# Patient Record
Sex: Male | Born: 1935 | Race: White | Hispanic: No | Marital: Married | Smoking: Former smoker
Health system: Southern US, Community
[De-identification: ages and names within clinical notes are randomized; demographics above are authoritative.]

## PROBLEM LIST (undated history)

## (undated) DIAGNOSIS — E785 Hyperlipidemia, unspecified: Secondary | ICD-10-CM

## (undated) DIAGNOSIS — M549 Dorsalgia, unspecified: Secondary | ICD-10-CM

## (undated) DIAGNOSIS — K5790 Diverticulosis of intestine, part unspecified, without perforation or abscess without bleeding: Secondary | ICD-10-CM

## (undated) DIAGNOSIS — D472 Monoclonal gammopathy: Secondary | ICD-10-CM

## (undated) DIAGNOSIS — I2510 Atherosclerotic heart disease of native coronary artery without angina pectoris: Secondary | ICD-10-CM

## (undated) DIAGNOSIS — M1990 Unspecified osteoarthritis, unspecified site: Secondary | ICD-10-CM

## (undated) DIAGNOSIS — G629 Polyneuropathy, unspecified: Secondary | ICD-10-CM

## (undated) DIAGNOSIS — M7072 Other bursitis of hip, left hip: Secondary | ICD-10-CM

## (undated) DIAGNOSIS — I10 Essential (primary) hypertension: Secondary | ICD-10-CM

## (undated) DIAGNOSIS — D649 Anemia, unspecified: Secondary | ICD-10-CM

## (undated) HISTORY — DX: Unspecified osteoarthritis, unspecified site: M19.90

## (undated) HISTORY — PX: ADENOIDECTOMY: SUR15

## (undated) HISTORY — DX: Monoclonal gammopathy: D47.2

## (undated) HISTORY — PX: COLONOSCOPY: SHX174

## (undated) HISTORY — PX: CORONARY ARTERY BYPASS GRAFT: SHX141

## (undated) HISTORY — PX: INGUINAL HERNIA REPAIR: SUR1180

## (undated) HISTORY — DX: Diverticulosis of intestine, part unspecified, without perforation or abscess without bleeding: K57.90

## (undated) HISTORY — DX: Anemia, unspecified: D64.9

## (undated) HISTORY — DX: Dorsalgia, unspecified: M54.9

## (undated) HISTORY — DX: Hyperlipidemia, unspecified: E78.5

## (undated) HISTORY — DX: Essential (primary) hypertension: I10

## (undated) HISTORY — DX: Other bursitis of hip, left hip: M70.72

## (undated) HISTORY — DX: Polyneuropathy, unspecified: G62.9

## (undated) HISTORY — DX: Atherosclerotic heart disease of native coronary artery without angina pectoris: I25.10

---

## 2008-01-16 ENCOUNTER — Encounter: Admission: RE | Admit: 2008-01-16 | Discharge: 2008-01-16 | Payer: Self-pay | Admitting: Internal Medicine

## 2009-02-26 ENCOUNTER — Ambulatory Visit: Payer: Self-pay | Admitting: Gastroenterology

## 2009-03-12 ENCOUNTER — Ambulatory Visit: Payer: Self-pay | Admitting: Gastroenterology

## 2009-08-11 HISTORY — PX: OTHER SURGICAL HISTORY: SHX169

## 2009-11-06 ENCOUNTER — Emergency Department (HOSPITAL_COMMUNITY): Admission: EM | Admit: 2009-11-06 | Discharge: 2009-11-06 | Payer: Self-pay | Admitting: Family Medicine

## 2010-03-11 ENCOUNTER — Encounter: Payer: Self-pay | Admitting: Cardiology

## 2010-10-14 ENCOUNTER — Ambulatory Visit: Payer: Self-pay | Admitting: Cardiology

## 2010-11-02 NOTE — Letter (Signed)
Summary: [facility]   Imported By: Debby Freiberg 03/30/2010 12:06:14  _____________________________________________________________________  External Attachment:    Type:   Image     Comment:   External Document

## 2011-01-21 ENCOUNTER — Encounter (INDEPENDENT_AMBULATORY_CARE_PROVIDER_SITE_OTHER): Payer: Medicare Other

## 2011-01-21 DIAGNOSIS — M7989 Other specified soft tissue disorders: Secondary | ICD-10-CM

## 2011-01-21 DIAGNOSIS — M79609 Pain in unspecified limb: Secondary | ICD-10-CM

## 2011-03-04 HISTORY — PX: US ECHOCARDIOGRAPHY: HXRAD669

## 2011-03-07 ENCOUNTER — Other Ambulatory Visit (HOSPITAL_COMMUNITY): Payer: Self-pay | Admitting: Internal Medicine

## 2011-03-07 DIAGNOSIS — R609 Edema, unspecified: Secondary | ICD-10-CM

## 2011-03-08 ENCOUNTER — Ambulatory Visit (HOSPITAL_COMMUNITY): Payer: Medicare Other | Attending: Internal Medicine | Admitting: Radiology

## 2011-03-08 DIAGNOSIS — I080 Rheumatic disorders of both mitral and aortic valves: Secondary | ICD-10-CM | POA: Insufficient documentation

## 2011-03-08 DIAGNOSIS — R609 Edema, unspecified: Secondary | ICD-10-CM

## 2011-03-08 DIAGNOSIS — I379 Nonrheumatic pulmonary valve disorder, unspecified: Secondary | ICD-10-CM | POA: Insufficient documentation

## 2011-03-08 DIAGNOSIS — R011 Cardiac murmur, unspecified: Secondary | ICD-10-CM | POA: Insufficient documentation

## 2011-03-08 DIAGNOSIS — I079 Rheumatic tricuspid valve disease, unspecified: Secondary | ICD-10-CM | POA: Insufficient documentation

## 2011-03-08 DIAGNOSIS — I10 Essential (primary) hypertension: Secondary | ICD-10-CM | POA: Insufficient documentation

## 2011-03-08 DIAGNOSIS — I2510 Atherosclerotic heart disease of native coronary artery without angina pectoris: Secondary | ICD-10-CM | POA: Insufficient documentation

## 2011-05-27 NOTE — Procedures (Unsigned)
DUPLEX DEEP VENOUS EXAM - LOWER EXTREMITY  INDICATION:  Bilateral lower extremity pain and swelling.  HISTORY:  Edema:  yes Trauma/Surgery:  no Pain:  yes PE:  no Previous DVT:  no Anticoagulants:  Half of a 325 mg aspirin daily Other:  DUPLEX EXAM:               CFV   SFV   PopV  PTV    GSV               R  L  R  L  R  L  R   L  R  L Thrombosis    0  0  0  0  0  0  0   0  0  0 Spontaneous   +  +  +  +  +  +  +   +  +  + Phasic        +  +  +  +  +  +  +   +  +  + Augmentation  +  +  +  +  +  +  +   +  +  + Compressible  +  +  +  +  +  +  +   +  +  + Competent     +  +  +  +  +  +  +   +  +  +  Legend:  + - yes  o - no  p - partial  D - decreased  IMPRESSION:  No evidence of deeper superficial thrombosis identified in the bilateral lower extremities. Preliminary report given to Cala Bradford, RN at Dr. Alver Fisher office.   _____________________________ Larina Earthly, M.D.  SH/MEDQ  D:  01/21/2011  T:  01/21/2011  Job:  [id number]

## 2011-08-02 ENCOUNTER — Encounter: Payer: Self-pay | Admitting: Nurse Practitioner

## 2011-08-03 ENCOUNTER — Encounter: Payer: Self-pay | Admitting: Nurse Practitioner

## 2011-08-03 ENCOUNTER — Ambulatory Visit (INDEPENDENT_AMBULATORY_CARE_PROVIDER_SITE_OTHER): Payer: Medicare Other | Admitting: Nurse Practitioner

## 2011-08-03 VITALS — BP 98/66 | HR 80 | Ht 70.0 in | Wt 150.8 lb

## 2011-08-03 DIAGNOSIS — I38 Endocarditis, valve unspecified: Secondary | ICD-10-CM | POA: Insufficient documentation

## 2011-08-03 DIAGNOSIS — I10 Essential (primary) hypertension: Secondary | ICD-10-CM | POA: Insufficient documentation

## 2011-08-03 DIAGNOSIS — E785 Hyperlipidemia, unspecified: Secondary | ICD-10-CM

## 2011-08-03 DIAGNOSIS — I2510 Atherosclerotic heart disease of native coronary artery without angina pectoris: Secondary | ICD-10-CM | POA: Insufficient documentation

## 2011-08-03 NOTE — Progress Notes (Signed)
Mitchell Fields Date of Birth: [birth year] Medical Record #[id number]  History of Present Illness: [name] is seen today for an approximate 10 month visit. He is seen for Dr. [place]. He has a known history of remote MI, remote CABG with HTN and hyperlipidemia. He had an echo in June of this year showing normal EF, mild aortic regurgitation, mild MR, mild to moderate LAE, mildly dilated RV, moderate RAE, moderate to severe TR and redundant atrial septum consistent with increased left atrial pressure. Unfortunately, he did not get called and says Dr. Clelia Croft did not get a report.   He comes in today. He is doing well. No chest pain or shortness of breath. Has just recovered from a bout of food poisoning. Blood pressure at home is well controlled. He has had to cut his Norvasc back due to some swelling. No CHF symptoms. Feels good on his medicines. Not exercising as he should due to bursitis.   Current Outpatient Prescriptions on File Prior to Visit  Medication Sig Dispense Refill  . aspirin 325 MG tablet Take 162.5 mg by mouth daily.        Marland Kitchen atenolol (TENORMIN) 50 MG tablet Take 25 mg by mouth daily.       Marland Kitchen atorvastatin (LIPITOR) 80 MG tablet Take 40 mg by mouth daily.       . Cholecalciferol (VITAMIN D PO) Take by mouth daily.        Marland Kitchen ezetimibe (ZETIA) 10 MG tablet Take 10 mg by mouth daily.        . Multiple Vitamin (MULTIVITAMIN) tablet Take 1 tablet by mouth daily.        . quinapril (ACCUPRIL) 40 MG tablet Take 20 mg by mouth at bedtime.       Marland Kitchen DISCONTD: amLODipine (NORVASC) 10 MG tablet Take 10 mg by mouth daily.         Allergies  Allergen Reactions  . Cortisone     REACTION: shut adrenal glands down    Past Medical History  Diagnosis Date  . Hyperlipidemia   . Hypertension   . MI, old   . Arthritis   . Coronary artery disease     TWO VESSEL BYPASS SURGERY in January 1995  . Palpitations   . Back pain     Past Surgical History  Procedure Date  . Coronary artery  bypass graft     X2. LIMA GRAFT AND A RIMA GRAFT.  . Back surgery     X3  . Hernia repair   . Negative stress echo 08/11/2009  . US echocardiography June 2012    Normal EF, mild aortic regurgitation, mild MR, LAE, mildly dilated RV, RAE, increased left atrial pressure, moderate to severe TR and mildly increased  PA systolic pressure.     History  Smoking status  . Former Smoker  . Quit date: 10/04/1967  Smokeless tobacco  . Not on file    History  Alcohol Use No    Family History  Problem Relation Age of Onset  . Lung cancer Father     Review of Systems: The review of systems is positive for bursitis. No chest pain or shortness of breath. No dizziness.  All other systems were reviewed and are negative.  Physical Exam: BP 98/66  Pulse 80  Ht 5\' 10"  (1.778 m)  Wt 150 lb 12.8 oz (68.402 kg)  BMI 21.64 kg/m2 Patient is very pleasant and in no acute distress. Skin is warm and dry. Color is  normal.  HEENT is unremarkable. Normocephalic/atraumatic. PERRL. Sclera are nonicteric. Neck is supple. No masses. No JVD. Lungs are clear. Cardiac exam shows a regular rate and rhythm. Soft systolic murmur noted.  Abdomen is soft. Extremities are without edema. Gait and ROM are intact. No gross neurologic deficits noted.   LABORATORY DATA:   Assessment / Plan:

## 2011-08-03 NOTE — Assessment & Plan Note (Signed)
Has labs checked by Dr. Clelia Croft.

## 2011-08-03 NOTE — Assessment & Plan Note (Signed)
Has had remote MI with remote CABG in 1995. Negative stress echo in 2010. I have advised for repeat stress testing but he wants to hold off. No symptoms at the present time. We will see him back in 6 months. Patient is agreeable to this plan and will call if any problems develop in the interim.

## 2011-08-03 NOTE — Assessment & Plan Note (Signed)
Blood pressure is good and well controlled at home. No change with his current medicines.

## 2011-08-03 NOTE — Patient Instructions (Signed)
Stay on your current medicines.  Stay active.   We will see you back in 6 months.  Call for any problems.  

## 2011-08-03 NOTE — Assessment & Plan Note (Signed)
Echo is Mitchell Fields is reviewed with the patient. Will forward copy to Dr. Clelia Croft. Clinically doing well. No CHF symptoms. Will follow.

## 2012-09-12 ENCOUNTER — Other Ambulatory Visit (HOSPITAL_COMMUNITY): Payer: Self-pay | Admitting: Internal Medicine

## 2012-09-12 ENCOUNTER — Other Ambulatory Visit (HOSPITAL_COMMUNITY): Payer: Self-pay | Admitting: *Deleted

## 2012-09-12 ENCOUNTER — Other Ambulatory Visit: Payer: Self-pay | Admitting: Internal Medicine

## 2012-09-12 DIAGNOSIS — R6881 Early satiety: Secondary | ICD-10-CM

## 2012-09-12 DIAGNOSIS — R198 Other specified symptoms and signs involving the digestive system and abdomen: Secondary | ICD-10-CM

## 2012-09-14 ENCOUNTER — Ambulatory Visit
Admission: RE | Admit: 2012-09-14 | Discharge: 2012-09-14 | Disposition: A | Discharge status: Home / Self Care | Payer: Medicare Other | Source: Ambulatory Visit | Attending: Internal Medicine | Admitting: Internal Medicine

## 2012-09-14 DIAGNOSIS — R6881 Early satiety: Secondary | ICD-10-CM

## 2012-09-14 MED ORDER — IOHEXOL 350 MG/ML SOLN
125.0000 mL | Freq: Once | INTRAVENOUS | Status: AC | PRN
  Administered 2012-09-14: 125 mL via INTRAVENOUS

## 2012-09-24 ENCOUNTER — Encounter (HOSPITAL_COMMUNITY)
Admission: RE | Admit: 2012-09-24 | Discharge: 2012-09-24 | Disposition: A | Discharge status: Home / Self Care | Payer: Medicare Other | Source: Ambulatory Visit | Attending: Internal Medicine | Admitting: Internal Medicine

## 2012-09-24 ENCOUNTER — Encounter (HOSPITAL_COMMUNITY): Payer: Self-pay

## 2012-09-24 DIAGNOSIS — R6881 Early satiety: Secondary | ICD-10-CM | POA: Insufficient documentation

## 2012-09-24 DIAGNOSIS — K219 Gastro-esophageal reflux disease without esophagitis: Secondary | ICD-10-CM | POA: Insufficient documentation

## 2012-09-24 DIAGNOSIS — R198 Other specified symptoms and signs involving the digestive system and abdomen: Secondary | ICD-10-CM

## 2012-09-24 MED ORDER — TECHNETIUM TC 99M SULFUR COLLOID
2.2000 | Freq: Once | INTRAVENOUS | Status: AC | PRN
  Administered 2012-09-24: 2.2 via INTRAVENOUS

## 2012-11-28 ENCOUNTER — Telehealth: Payer: Self-pay | Admitting: Gastroenterology

## 2012-11-28 NOTE — Telephone Encounter (Signed)
Pt scheduled to see Mike Gip PA tomorrow at 2pm. Malachi Bonds to notify pt of appt date and time and fax records.

## 2012-11-29 ENCOUNTER — Encounter: Payer: Self-pay | Admitting: Physician Assistant

## 2012-11-29 ENCOUNTER — Ambulatory Visit (INDEPENDENT_AMBULATORY_CARE_PROVIDER_SITE_OTHER): Payer: Medicare Other | Admitting: Physician Assistant

## 2012-11-29 VITALS — BP 128/80 | HR 80 | Ht 69.0 in | Wt 153.2 lb

## 2012-11-29 DIAGNOSIS — R110 Nausea: Secondary | ICD-10-CM

## 2012-11-29 DIAGNOSIS — R6881 Early satiety: Secondary | ICD-10-CM

## 2012-11-29 MED ORDER — PANTOPRAZOLE SODIUM 40 MG PO TBEC: 40.0000 mg | DELAYED_RELEASE_TABLET | Freq: Every day | ORAL | Status: DC

## 2012-11-29 NOTE — Patient Instructions (Addendum)
We sent a prescription to [facility] for the Pantoprazole Sodium ( Protonix ). Take 1 tab 30 min before breakfast daily.  You have been scheduled for an endoscopy with propofol. Please follow written instructions given to you at your visit today. If you use inhalers (even only as needed) or a CPAP machine, please bring them with you on the day of your procedure.

## 2012-11-30 ENCOUNTER — Encounter: Payer: Self-pay | Admitting: Physician Assistant

## 2012-11-30 NOTE — Progress Notes (Signed)
Reviewed and agree with management. Robert D. Kaplan, M.D., FACG  

## 2012-11-30 NOTE — Progress Notes (Signed)
Subjective:    Patient ID: Mitchell Fields, male    DOB: Jan 23, 1936, 77 y.o.   MRN: [id number]  HPI  Mitchell Fields is a pleasant 77 year old white male known previously to Dr. Arlyce Dice from colonoscopy. He had colonoscopy done in 2010 and showed moderate sigmoid diverticulosis and internal hemorrhoids. He  Is referred by Dr. Clelia Croft  today for evaluation of early satiety and anorexia.  Patient states he's been having symptoms for about a year and that he has lost about 10 pounds during that time. He says his main problem is that he just does not have any appetite and and when he does eat he  Is full after about 3 bites. He says he forces himself to continue eating and therefore has kept his weight fairly stable. He says that if he eat  too much he would be uncomfortable in his upper abdomen, somewhat nauseated and feels like he wants to vomit. He rarely has heartburn or indigestion and denies any dysphagia or odynophagia . He   really denies any abdominal pain, has not had any changes in his bowel habits melena or hematochezia. He has undergone evaluation with gastric emptying scan which was done in December of 2013 and was normal and he had a CT angio  of his abdomen and pelvis in December of 2013 which showed his mesenteric vessels to be patent. He did have evidence for renal artery stenosis and a 6 mm nodule in the right lower lobe 5 mm nodule in the left lower lobe- no other abdominal abnormalities noted. He has been placed on Remeron over the past several months but says he has not noticed any benefit whatsoever. He occasionally takes ibuprofen and takes 160 mg of aspirin daily.    Review of Systems  Constitutional: Positive for appetite change and fatigue.  HENT: Negative.   Eyes: Negative.   Respiratory: Negative.   Cardiovascular: Negative.   Gastrointestinal: Positive for nausea and abdominal pain.  Endocrine: Negative.   Genitourinary: Negative.   Allergic/Immunologic: Negative.   Neurological:  Negative.   Hematological: Negative.   Psychiatric/Behavioral: Negative.    Outpatient Prescriptions Prior to Visit  Medication Sig Dispense Refill  . amLODipine (NORVASC) 5 MG tablet Take 2.5 mg by mouth daily.        Marland Kitchen aspirin 325 MG tablet Take 162.5 mg by mouth daily.        Marland Kitchen atenolol (TENORMIN) 50 MG tablet Take 25 mg by mouth daily.       Marland Kitchen atorvastatin (LIPITOR) 80 MG tablet Take 20 mg by mouth daily.       . Cholecalciferol (VITAMIN D PO) Take by mouth daily.        Marland Kitchen ezetimibe (ZETIA) 10 MG tablet Take 10 mg by mouth daily.        . quinapril (ACCUPRIL) 40 MG tablet Take 20 mg by mouth at bedtime.       . Multiple Vitamin (MULTIVITAMIN) tablet Take 1 tablet by mouth daily.         No facility-administered medications prior to visit.   Allergies  Allergen Reactions  . Cortisone     REACTION: shut adrenal glands down if given too much   Patient Active Problem List  Diagnosis  . CAD (coronary artery disease)  . HTN (hypertension)  . Hyperlipidemia  . Valvular heart disease   History  Substance Use Topics  . Smoking status: Former Smoker    Quit date: 10/04/1967  . Smokeless tobacco: Never Used  . Alcohol  Use: No   family history includes Diabetes in his father and Lung cancer in his father.     Objective:   Physical Exam Well-developed older white male in no acute distress, pleasant blood pressure 128/80 pulse 80 height 5 foot 9 weight 153. HEENT; nontraumatic normocephalic EOMI PERRLA sclera anicteric, Supple no JVD, Cardiovascula;r regular rate and rhythm with S1-S2 no murmur or gallop he does have a sternal incisional scar, Pulmonary; clear bilaterally, Abdomen; soft, nontender, nondistended bowel sounds are active there is no succussion splash no palpable mass or hepatosplenomegaly  Rectal; exam not done, Extremities; no clubbing cyanosis or edema skin warm and dry, Psych; mood and affect normal and appropriate.       Assessment & Plan:   #8 77 year old male  with a one-year history of anorexia and early satiety of unclear etiology. Minimal weight loss. Workup thus far with negative gastric emptying scan, and patent mesenteric vessels on CT angio. Patient denies depression, and has had no response to Remeron. Rule out gastropathy, peptic ulcer disease, low-grade outlet obstruction and gallbladder disease, or possible medication side effect(lipitor) #2 sigmoid diverticulosis #3 colon neoplasia surveillance-Asian up to date last colonoscopy 2010 #4 new finding of renal artery stenosis #5 lung nodules #6 hyperlipidemia #7 hypertension  Plan; will give him a trial of Protonix 40 mg by mouth daily Schedule for upper endoscopy with Dr. Toney Rakes discussed in detail with the patient and he is agreeable to proceed, If upper endoscopy is negative he will need upper abdominal ultrasound, and if this is unrevealing would consider stopping Lipitor as can have multiple GI side effects.

## 2013-01-04 ENCOUNTER — Ambulatory Visit (AMBULATORY_SURGERY_CENTER): Payer: Medicare Other | Admitting: Gastroenterology

## 2013-01-04 ENCOUNTER — Encounter: Payer: Self-pay | Admitting: Gastroenterology

## 2013-01-04 VITALS — BP 139/79 | HR 62 | Temp 99.7°F | Resp 17 | Ht 69.0 in | Wt 153.0 lb

## 2013-01-04 DIAGNOSIS — R110 Nausea: Secondary | ICD-10-CM

## 2013-01-04 DIAGNOSIS — R6881 Early satiety: Secondary | ICD-10-CM

## 2013-01-04 DIAGNOSIS — K222 Esophageal obstruction: Secondary | ICD-10-CM

## 2013-01-04 MED ORDER — SODIUM CHLORIDE 0.9 % IV SOLN: 500.0000 mL | INTRAVENOUS | Status: DC

## 2013-01-04 NOTE — Progress Notes (Signed)
Stable to RR 

## 2013-01-04 NOTE — Op Note (Signed)
[facility] [address] [place], [address]   ENDOSCOPY PROCEDURE REPORT  PATIENT: Mitchell, Fields  MR#: [id number] BIRTHDATE: 04-09-1936 , 76  yrs. old GENDER: Male ENDOSCOPIST: Louis Meckel, MD REFERRED BY:  Kari Baars, M.D. PROCEDURE DATE:  01/04/2013 PROCEDURE:  EGD, diagnostic and Maloney dilation of esophagus ASA CLASS:     Class II INDICATIONS:  early satiety. MEDICATIONS: MAC sedation, administered by CRNA and propofol (Diprivan) 100mg  IV TOPICAL ANESTHETIC:  DESCRIPTION OF PROCEDURE: After the risks benefits and alternatives of the procedure were thoroughly explained, informed consent was obtained.  The LB-GIF Q180 [id number] endoscope was introduced through the mouth and advanced to the third portion of the duodenum. Without limitations.  The instrument was slowly withdrawn as the mucosa was fully examined.      There is a moderate stricture at the GE junction.  A 7 mm gastroscope easily traversed the stricture.   There is a moderate stricture at the GE junction.  A 7 mm gastroscope easily traversed the stricture.   The upper, middle and distal third of the esophagus were carefully inspected and no abnormalities were noted. The z-line was well seen at the GEJ.  The endoscope was pushed into the fundus which was normal including a retroflexed view.  The antrum, gastric body, first and second part of the duodenum were unremarkable.   The remainder of the upper endoscopy exam was otherwise normal.  Retroflexed views revealed no abnormalities. The scope was then withdrawn.  A #52 Jerene Dilling dilator was passed with mild resistance. There was no heme.  COMPLICATIONS: There were no complications.  ENDOSCOPIC IMPRESSION: esophageal stricture-status post Elease Hashimoto dilation RECOMMENDATIONS: office followup 3-4 weeks  REPEAT EXAM:  eSigned:  Louis Meckel, MD 01/04/2013 3:21 PM   CC:

## 2013-01-04 NOTE — Progress Notes (Addendum)
Patient did not have preoperative order for IV antibiotic SSI prophylaxis. (G8918)  Patient did not experience any of the following events: a burn prior to discharge; a fall within the facility; wrong site/side/patient/procedure/implant event; or a hospital transfer or hospital admission upon discharge from the facility. (G8907)  

## 2013-01-04 NOTE — Patient Instructions (Addendum)
YOU HAD AN ENDOSCOPIC PROCEDURE TODAY AT THE [facility]: Refer to the procedure report that was given to you for any specific questions about what was found during the examination.  If the procedure report does not answer your questions, please call your gastroenterologist to clarify.  If you requested that your care partner not be given the details of your procedure findings, then the procedure report has been included in a sealed envelope for you to review at your convenience later.  YOU SHOULD EXPECT: Some feelings of bloating in the abdomen. Passage of more gas than usual.  Walking can help get rid of the air that was put into your GI tract during the procedure and reduce the bloating. If you had a lower endoscopy (such as a colonoscopy or flexible sigmoidoscopy) you may notice spotting of blood in your stool or on the toilet paper. If you underwent a bowel prep for your procedure, then you may not have a normal bowel movement for a few days.  DIET:   Please give the patient clear liquids when you get home so you know that she can swallow.   For the rest of the day, she needs to stay on a soft diet just in case she has some swelling.   ACTIVITY: Your care partner should take you home directly after the procedure.  You should plan to take it easy, moving slowly for the rest of the day.  You can resume normal activity the day after the procedure however you should NOT DRIVE or use heavy machinery for 24 hours (because of the sedation medicines used during the test).    SYMPTOMS TO REPORT IMMEDIATELY: A gastroenterologist can be reached at any hour.  During normal business hours, 8:30 AM to 5:00 PM Monday through Friday, call [phone].  After hours and on weekends, please call the GI answering service at [phone] who will take a message and have the physician on call contact you.   Following upper endoscopy (EGD)  Vomiting of blood or coffee ground material  New chest pain  or pain under the shoulder blades  Painful or persistently difficult swallowing  New shortness of breath  Fever of 100F or higher  Black, tarry-looking stools  FOLLOW UP: If any biopsies were taken you will be contacted by phone or by letter within the next 1-3 weeks.  Call your gastroenterologist if you have not heard about the biopsies in 3 weeks.  Our staff will call the home number listed on your records the next business day following your procedure to check on you and address any questions or concerns that you may have at that time regarding the information given to you following your procedure. This is a courtesy call and so if there is no answer at the home number and we have not heard from you through the emergency physician on call, we will assume that you have returned to your regular daily activities without incident.  SIGNATURES/CONFIDENTIALITY: You and/or your care partner have signed paperwork which will be entered into your electronic medical record.  These signatures attest to the fact that that the information above on your After Visit Summary has been reviewed and is understood.  Full responsibility of the confidentiality of this discharge information lies with you and/or your care-partner.

## 2013-01-07 ENCOUNTER — Telehealth: Payer: Self-pay | Admitting: *Deleted

## 2013-01-07 NOTE — Telephone Encounter (Signed)
  Follow up Call-  Call back number 01/04/2013  Post procedure Call Back phone  # [phone]  Permission to leave phone message Yes     Patient questions:  Do you have a fever, pain , or abdominal swelling? no Pain Score  0 *  Have you tolerated food without any problems? yes  Have you been able to return to your normal activities? yes  Do you have any questions about your discharge instructions: Diet   no Medications  no Follow up visit  no  Do you have questions or concerns about your Care? no  Actions: * If pain score is 4 or above: No action needed, pain <4.

## 2013-01-21 ENCOUNTER — Encounter: Payer: Self-pay | Admitting: *Deleted

## 2013-01-28 ENCOUNTER — Other Ambulatory Visit (INDEPENDENT_AMBULATORY_CARE_PROVIDER_SITE_OTHER): Payer: Medicare Other

## 2013-01-28 ENCOUNTER — Ambulatory Visit (INDEPENDENT_AMBULATORY_CARE_PROVIDER_SITE_OTHER): Payer: Medicare Other | Admitting: Gastroenterology

## 2013-01-28 ENCOUNTER — Encounter: Payer: Self-pay | Admitting: Gastroenterology

## 2013-01-28 VITALS — BP 128/80 | HR 80 | Ht 69.0 in | Wt 149.0 lb

## 2013-01-28 DIAGNOSIS — R109 Unspecified abdominal pain: Secondary | ICD-10-CM

## 2013-01-28 DIAGNOSIS — R634 Abnormal weight loss: Secondary | ICD-10-CM

## 2013-01-28 DIAGNOSIS — R6889 Other general symptoms and signs: Secondary | ICD-10-CM

## 2013-01-28 DIAGNOSIS — R918 Other nonspecific abnormal finding of lung field: Secondary | ICD-10-CM

## 2013-01-28 LAB — COMPREHENSIVE METABOLIC PANEL
ALT: 23 U/L (ref 0–53)
AST: 23 U/L (ref 0–37)
BUN: 17 mg/dL (ref 6–23)
CO2: 29 mEq/L (ref 19–32)
Calcium: 8.7 mg/dL (ref 8.4–10.5)
Creatinine, Ser: 1.1 mg/dL (ref 0.4–1.5)
GFR: 70.2 mL/min (ref >60.00)
Total Bilirubin: 0.9 mg/dL (ref 0.3–1.2)

## 2013-01-28 LAB — CBC WITH DIFFERENTIAL/PLATELET
Basophils Absolute: 0 10*3/uL (ref 0.0–0.1)
Eosinophils Absolute: 0.6 10*3/uL (ref 0.0–0.7)
Eosinophils Relative: 6.9 % — ABNORMAL HIGH (ref 0.0–5.0)
Lymphocytes Relative: 16.4 % (ref 12.0–46.0)
MCV: 97 fl (ref 78.0–100.0)
Monocytes Absolute: 1.5 10*3/uL — ABNORMAL HIGH (ref 0.1–1.0)
Monocytes Relative: 18.2 % — ABNORMAL HIGH (ref 3.0–12.0)
Neutrophils Relative %: 58 % (ref 43.0–77.0)
Platelets: 291 10*3/uL (ref 150.0–400.0)
WBC: 8.1 10*3/uL (ref 4.5–10.5)

## 2013-01-28 LAB — T4: T4, Total: 7 ug/dL (ref 5.0–12.5)

## 2013-01-28 LAB — TSH: TSH: 1.68 u[IU]/mL (ref 0.35–5.50)

## 2013-01-28 NOTE — Progress Notes (Signed)
History of Present Illness:  Mitchell Fields has returned following upper endoscopy. An early esophageal stricture was seen and dilated.  He continues to complain of weakness, anorexia and early satiety. He denies abdominal pain.  He's lost about 10 pounds over the past year but he feels that it has been difficult to maintain his weight. CT angiogram in December, 2013 demonstrated patent vessels. There was a 6 mm nodule right lower lobe of lung and subtle 5 mm nodule in the left lower lobe. He is not a smoker.    Review of Systems: Pertinent positive and negative review of systems were noted in the above HPI section. All other review of systems were otherwise negative.    Current Medications, Allergies, Past Medical History, Past Surgical History, Family History and Social History were reviewed in [employer] electronic medical record  Vital signs were reviewed in today's medical record. Physical Exam: General: Well developed , well nourished, no acute distress Skin: anicteric Head: Normocephalic and atraumatic Eyes:  sclerae anicteric, EOMI Ears: Normal auditory acuity Mouth: No deformity or lesions Lungs: Clear throughout to auscultation Heart: Regular rate and rhythm; no murmurs, rubs or bruits Abdomen: Soft, non tender and non distended. No masses, hepatosplenomegaly or hernias noted. Normal Bowel sounds. Liver is palpable the right costal margin Rectal:deferred Musculoskeletal: Symmetrical with no gross deformities  Pulses:  Normal pulses noted Extremities: No clubbing, cyanosis, edema or deformities noted Neurological: Alert oriented x 4, grossly nonfocal Psychological:  Alert and cooperative. Normal mood and affect

## 2013-01-28 NOTE — Patient Instructions (Addendum)
Go to the basement for labs today  You have been scheduled for a CT scan of the abdomen and pelvis at [facility] CT ([address]---this is in the same building as Architectural technologist).   You are scheduled on5/10/2012 at 11am. You should arrive 15 minutes prior to your appointment time for registration. Please follow the written instructions below on the day of your exam:  WARNING: IF YOU ARE ALLERGIC TO IODINE/X-RAY DYE, PLEASE NOTIFY RADIOLOGY IMMEDIATELY AT [phone]! YOU WILL BE GIVEN A 13 HOUR PREMEDICATION PREP.  1) Do not eat or drink anything after 7am (4 hours prior to your test) 2) You have been given 2 bottles of oral contrast to drink. The solution may taste               better if refrigerated, but do NOT add ice or any other liquid to this solution. Shake             well before drinking.    Drink 1 bottle of contrast @ 9am (2 hours prior to your exam)  Drink 1 bottle of contrast @ 10am (1 hour prior to your exam)  You may take any medications as prescribed with a small amount of water except for the following: Metformin, Glucophage, Glucovance, Avandamet, Riomet, Fortamet, Actoplus Met, Janumet, Glumetza or Metaglip. The above medications must be held the day of the exam AND 48 hours after the exam.  The purpose of you drinking the oral contrast is to aid in the visualization of your intestinal tract. The contrast solution may cause some diarrhea. Before your exam is started, you will be given a small amount of fluid to drink. Depending on your individual set of symptoms, you may also receive an intravenous injection of x-ray contrast/dye. Plan on being at [facility] for 30 minutes or long, depending on the type of exam you are having performed.  This test typically takes 30-45 minutes to complete.  If you have any questions regarding your exam or if you need to reschedule, you may call the CT department at [phone] between the hours of 8:00 am and 5:00  pm, Monday-Friday.  ________________________________________________________________________

## 2013-01-28 NOTE — Assessment & Plan Note (Addendum)
Constellation of anorexia, early satiety and weight loss raises the concern of an occult malignancy although nothing has been noted as evidence by EGD or physical exam. Note small lung nodules on the CT from December, 2013. There is no evidence for vascular insufficiency.  Recommendations #1 CT of the chest, abdomen and pelvis #2 check CBC, apprehensive metabolic profile and CRP, thyroid function tests

## 2013-01-31 ENCOUNTER — Inpatient Hospital Stay: Admission: RE | Admit: 2013-01-31 | Payer: Medicare Other | Source: Ambulatory Visit

## 2013-01-31 ENCOUNTER — Ambulatory Visit (INDEPENDENT_AMBULATORY_CARE_PROVIDER_SITE_OTHER)
Admission: RE | Admit: 2013-01-31 | Discharge: 2013-01-31 | Disposition: A | Discharge status: Home / Self Care | Payer: Medicare Other | Source: Ambulatory Visit | Attending: Gastroenterology | Admitting: Gastroenterology

## 2013-01-31 DIAGNOSIS — R918 Other nonspecific abnormal finding of lung field: Secondary | ICD-10-CM

## 2013-01-31 DIAGNOSIS — R109 Unspecified abdominal pain: Secondary | ICD-10-CM

## 2013-01-31 DIAGNOSIS — R6889 Other general symptoms and signs: Secondary | ICD-10-CM

## 2013-01-31 MED ORDER — IOHEXOL 300 MG/ML  SOLN
100.0000 mL | Freq: Once | INTRAMUSCULAR | Status: AC | PRN
  Administered 2013-01-31: 100 mL via INTRAVENOUS

## 2013-01-31 NOTE — Progress Notes (Signed)
Quick Note:  Please inform the patient that lab work and CT scan were normal and to continue current plan of action. OV 4-6 weeks ______

## 2013-03-08 ENCOUNTER — Other Ambulatory Visit (HOSPITAL_COMMUNITY): Payer: Self-pay | Admitting: Internal Medicine

## 2013-03-08 NOTE — Progress Notes (Signed)
Spoke to Dr. Clelia Croft regarding order received for Cosyntropin Stimulation test. He stated even with patient's documented allergy to Cortisone he was to receive this test. MD stated patient has been receiving steroids and to proceed with the orders for 6/12 test.

## 2013-03-11 ENCOUNTER — Telehealth: Payer: Self-pay | Admitting: Oncology

## 2013-03-11 ENCOUNTER — Ambulatory Visit (INDEPENDENT_AMBULATORY_CARE_PROVIDER_SITE_OTHER): Payer: Medicare Other | Admitting: Gastroenterology

## 2013-03-11 ENCOUNTER — Encounter: Payer: Self-pay | Admitting: Gastroenterology

## 2013-03-11 VITALS — BP 100/70 | HR 80 | Ht 69.0 in | Wt 148.0 lb

## 2013-03-11 DIAGNOSIS — R918 Other nonspecific abnormal finding of lung field: Secondary | ICD-10-CM | POA: Insufficient documentation

## 2013-03-11 DIAGNOSIS — R634 Abnormal weight loss: Secondary | ICD-10-CM

## 2013-03-11 NOTE — Telephone Encounter (Signed)
S/W PT IN RE NP APPT 06/18 @ 3 W/DR. HA REFERRING DR. SHAW DX- MONOCLONAL GAMMOPATHY WELCOME PACKET MAILED.

## 2013-03-11 NOTE — Assessment & Plan Note (Signed)
No specific etiology has been determined. There are some abnormalities in his serum immunoelectrophoresis for which he is going to be evaluated by hematology.

## 2013-03-11 NOTE — Patient Instructions (Addendum)
Follow up with Dr Clelia Croft Cc Dr Clelia Croft

## 2013-03-11 NOTE — Progress Notes (Signed)
History of Present Illness:  Patient has returned following upper endoscopy with dilatation of a distal esophageal stricture. He continues to complain of mild anorexia and loss of energy. CT of the chest demonstrated stable pulmonary nodules. No abnormalities were seen on CT of the abdomen and pelvis.  Recent blood work at Dr. 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    Review of Systems: Pertinent positive and negative review of systems were noted in the above HPI section. All other review of systems were otherwise negative.    Current Medications, Allergies, Past Medical History, Past Surgical History, Family History and Social History were reviewed in [employer] electronic medical record  Vital signs were reviewed in today's medical record. Physical Exam: General: Well developed , well nourished, no acute distress

## 2013-03-11 NOTE — Assessment & Plan Note (Signed)
Lung nodules are stable and CT scan. Followup exam is recommended and will be done by Dr. Clelia Croft

## 2013-03-12 ENCOUNTER — Telehealth: Payer: Self-pay | Admitting: Oncology

## 2013-03-12 NOTE — Telephone Encounter (Signed)
C/D 03/12/13 for appt. 03/20/13 °

## 2013-03-14 ENCOUNTER — Encounter (HOSPITAL_COMMUNITY)
Admission: RE | Admit: 2013-03-14 | Discharge: 2013-03-14 | Disposition: A | Discharge status: Home / Self Care | Payer: Medicare Other | Source: Ambulatory Visit | Attending: Internal Medicine | Admitting: Internal Medicine

## 2013-03-14 ENCOUNTER — Encounter (HOSPITAL_COMMUNITY): Payer: Self-pay

## 2013-03-14 DIAGNOSIS — R5381 Other malaise: Secondary | ICD-10-CM | POA: Insufficient documentation

## 2013-03-14 DIAGNOSIS — R634 Abnormal weight loss: Secondary | ICD-10-CM | POA: Insufficient documentation

## 2013-03-14 DIAGNOSIS — E2749 Other adrenocortical insufficiency: Secondary | ICD-10-CM | POA: Insufficient documentation

## 2013-03-14 MED ORDER — COSYNTROPIN 0.25 MG IJ SOLR
0.2500 mg | Freq: Once | INTRAMUSCULAR | Status: AC
  Administered 2013-03-14: 0.25 mg via INTRAVENOUS
  Filled 2013-03-14: qty 0.25

## 2013-03-14 NOTE — Progress Notes (Signed)
Spoke with Dr Waynard Edwards re: npo status. Received orders to cont. Acth Stim test.

## 2013-03-14 NOTE — Progress Notes (Signed)
Spoke with Shelia in Lab, uncertain of npo status for acth stim. Test. Paged Dr on call.

## 2013-03-14 NOTE — Progress Notes (Signed)
Pt drank 4oz of white grape juice with meds @ 630am today. No food after midnight. Called Lab shelia to clarify npo status. Silvio Pate will call back.

## 2013-03-15 ENCOUNTER — Encounter: Payer: Self-pay | Admitting: Oncology

## 2013-03-15 DIAGNOSIS — D472 Monoclonal gammopathy: Secondary | ICD-10-CM | POA: Insufficient documentation

## 2013-03-16 LAB — ACTH STIMULATION, 3 TIME POINTS
Cortisol, 30 Min: 25.6 ug/dL (ref >20)
Cortisol, 60 Min: 25.9 ug/dL (ref >20)

## 2013-03-19 NOTE — Patient Instructions (Addendum)
1.  Issue:  Anemia with positive monoclonal protein spike, neuropathy, orthostasis. 2.  Potential diagnosis:  Multiple myeloma vs. amyloidosis to tie all the presentations together or diagnosis of anemia of chronic disease in addition to MGUS (monoclonal gammopathy of unknown significance). 3.  Work up:     *  24 hour urine collection to rule out myeloma protein in the urine.  *  Skeletal Xray to rule out bone lesions that are indicative of myeloma.  *  Bone marrow biopsy to rule out myeloma.   *  Echocardiogram to see if there are suggestions of amyloidosis.  4.  Follow up:  In about 2 weeks to discuss result of work up.

## 2013-03-20 ENCOUNTER — Ambulatory Visit (HOSPITAL_BASED_OUTPATIENT_CLINIC_OR_DEPARTMENT_OTHER): Payer: Medicare Other | Admitting: Oncology

## 2013-03-20 ENCOUNTER — Ambulatory Visit: Payer: Medicare Other

## 2013-03-20 ENCOUNTER — Encounter: Payer: Self-pay | Admitting: Oncology

## 2013-03-20 ENCOUNTER — Telehealth: Payer: Self-pay | Admitting: Oncology

## 2013-03-20 ENCOUNTER — Other Ambulatory Visit (HOSPITAL_BASED_OUTPATIENT_CLINIC_OR_DEPARTMENT_OTHER): Payer: Medicare Other | Admitting: Lab

## 2013-03-20 VITALS — BP 163/81 | HR 64 | Temp 97.8°F | Resp 18 | Ht 69.0 in | Wt 148.1 lb

## 2013-03-20 DIAGNOSIS — D472 Monoclonal gammopathy: Secondary | ICD-10-CM

## 2013-03-20 DIAGNOSIS — D539 Nutritional anemia, unspecified: Secondary | ICD-10-CM

## 2013-03-20 DIAGNOSIS — I951 Orthostatic hypotension: Secondary | ICD-10-CM

## 2013-03-20 LAB — CBC WITH DIFFERENTIAL/PLATELET
BASO%: 0.9 % (ref 0.0–2.0)
EOS%: 1.1 % (ref 0.0–7.0)
HCT: 37.4 % — ABNORMAL LOW (ref 38.4–49.9)
HGB: 13.2 g/dL (ref 13.0–17.1)
LYMPH%: 24.1 % (ref 14.0–49.0)
MCH: 33.1 pg (ref 27.2–33.4)
MCHC: 35.4 g/dL (ref 32.0–36.0)
MCV: 93.6 fL (ref 79.3–98.0)
MONO#: 0.7 10*3/uL (ref 0.1–0.9)
MONO%: 9.8 % (ref 0.0–14.0)
NEUT#: 4.8 10*3/uL (ref 1.5–6.5)
RBC: 3.99 10*6/uL — ABNORMAL LOW (ref 4.20–5.82)

## 2013-03-20 LAB — COMPREHENSIVE METABOLIC PANEL (CC13)
ALT: 37 U/L (ref 0–55)
Albumin: 3.8 g/dL (ref 3.5–5.0)
CO2: 26 mEq/L (ref 22–29)
Glucose: 122 mg/dl — ABNORMAL HIGH (ref 70–99)
Potassium: 4.5 mEq/L (ref 3.5–5.1)
Sodium: 130 mEq/L — ABNORMAL LOW (ref 136–145)
Total Protein: 7.6 g/dL (ref 6.4–8.3)

## 2013-03-20 LAB — CHCC SMEAR

## 2013-03-20 NOTE — Progress Notes (Signed)
The patient does have living will and think may have POA. No financial issues with checking in new patient. He wants all communication via email and he MYChart.

## 2013-03-20 NOTE — Progress Notes (Signed)
[facility]  Telephone:(336) [phone] [phone]     INITIAL HEMATOLOGY CONSULTATION    Referral MD:  Kari Baars, M.D.   Reason for Referral: positive Mspike.     HPI:  Mitchell Fields is a 77 year-old man with history of HTN, HLP, CAD.  He has DJD and chronic back pain.  He underwent 3 years ago steroid injection and developed adrenal insufficiency and recurrent orthostatic hypotension.  These have since resolved.  He also developed progressive bilateral neuropathy in his feet.  He also developed anemia recently.  Work up showed no VitB12 deficiency or iron deficiency.  His colonoscopy was up to date when he turned 72.  Dr. Clelia Croft sent for SPEP which showed M-spike of 05 g/dL; with IFIX notable for kappa light chain IgG monoclonal protein.  IgG was 1218 mg/dL (ref [id number]), IgA 782 mg/dL (ref 95-62); IgM 25 mg/dl (ref 13-086).  He was thus kindly referred to the [facility] for evaluation.   Mitchell Fields presented to the clinic for the first time today with his wife.  Fatigue x 3 years (since adrenal insufficiency).  Lack of stamina.  Golf not as frequent or as well as before.; no heavy yard.   Last colonoscopy age 37 negative.  No appetite.  10-12-lb non intentional weight loss.  Chronic back pain; stable.  He denied leg weakness, bowel/bladder incontinence.   Patient denies fever, headache, visual changes, confusion, drenching night sweats, palpable lymph node swelling, mucositis, odynophagia, dysphagia, nausea vomiting, jaundice, chest pain, palpitation, shortness of breath, dyspnea on exertion, productive cough, gum bleeding, epistaxis, hematemesis, hemoptysis, abdominal pain, abdominal swelling, early satiety, melena, hematochezia, hematuria, skin rash, spontaneous bleeding, joint swelling, joint pain, heat or cold intolerance, focal motor weakness.       Past Medical History  Diagnosis Date  . Hyperlipidemia   . Hypertension   . Arthritis   .  Coronary artery disease     TWO VESSEL BYPASS SURGERY in January 1995  . Back pain   . Diverticulosis   . Bursitis of left hip   . Anemia   . Monoclonal gammopathy   . Neuropathy   :    Past Surgical History  Procedure Laterality Date  . Coronary artery bypass graft      X2. LIMA GRAFT AND A RIMA GRAFT.  . Back surgery  1985, 1995, 2002    X3  . Inguinal hernia repair Left   . Negative stress echo  08/11/2009  . US echocardiography  June 2012    Normal EF, mild aortic regurgitation, mild MR, LAE, mildly dilated RV, RAE, increased left atrial pressure, moderate to severe TR and mildly increased  PA systolic pressure.   . Adenoidectomy    :   CURRENT MEDS: Current Outpatient Prescriptions  Medication Sig Dispense Refill  . amLODipine (NORVASC) 2.5 MG tablet Take 2.5 mg by mouth daily.      . Ascorbic Acid (VITAMIN C) 100 MG tablet Take 100 mg by mouth daily.      Marland Kitchen aspirin 325 MG tablet Take 162.5 mg by mouth daily.        Marland Kitchen atenolol (TENORMIN) 25 MG tablet Take 25 mg by mouth daily.      Marland Kitchen atorvastatin (LIPITOR) 20 MG tablet Take 20 mg by mouth daily.      . B Complex Vitamins (VITAMIN B COMPLEX PO) Take 1 tablet by mouth daily.      . Cholecalciferol (VITAMIN D PO) Take by  mouth daily.        Marland Kitchen ezetimibe (ZETIA) 10 MG tablet Take 10 mg by mouth daily.        Marland Kitchen HYDROcodone-acetaminophen (VICODIN) 5-500 MG per tablet Take 0.5 tablets by mouth as needed for pain (3-4 per week).       Marland Kitchen levothyroxine (SYNTHROID, LEVOTHROID) 50 MCG tablet Take 50 mcg by mouth daily before breakfast.      . quinapril (ACCUPRIL) 20 MG tablet Take 20 mg by mouth at bedtime.       No current facility-administered medications for this visit.      Allergies  Allergen Reactions  . Cortisone     REACTION: shut adrenal glands down if given too much  :  Family History  Problem Relation Age of Onset  . Lung cancer Father 23    smoker  . Diabetes Father   . Cancer Sister     breast in one;  "bone" cancer in other  :  History   Social History  . Marital Status: Married    Spouse Name: N/A    Number of Children: 2  . Years of Education: N/A   Occupational History  . retired     Forensic scientist.  [employer]   Social History Main Topics  . Smoking status: Former Smoker -- 2.00 packs/day for 5 years    Quit date: 10/04/1967  . Smokeless tobacco: Never Used  . Alcohol Use: No  . Drug Use: No  . Sexually Active: Not on file   Other Topics Concern  . Not on file   Social History Narrative  . No narrative on file  :  REVIEW OF SYSTEM:  The rest of the 14-point review of sytem was negative.   Exam: ECOG 1  General:  well-nourished man, in no acute distress.  Eyes:  no scleral icterus.  ENT:  There were no oropharyngeal lesions.  Neck was without thyromegaly.  Lymphatics:  Negative cervical, supraclavicular or axillary adenopathy.  Respiratory: lungs were clear bilaterally without wheezing or crackles.  Cardiovascular:  Regular rate and rhythm, S1/S2, without murmur, rub or gallop.  There was no pedal edema.  GI:  abdomen was soft, flat, nontender, nondistended, without organomegaly.  Muscoloskeletal:  no spinal tenderness of palpation of vertebral spine.  Skin exam was without echymosis, petichae.  Neuro exam was nonfocal.  Patient was able to get on and off exam table without assistance.  Gait was normal.  Patient was alert and oriented.  Attention was good.   Language was appropriate.  Mood was normal without depression.  Speech was not pressured.  Thought content was not tangential.    LABS:  Lab Results  Component Value Date   WBC 7.5 03/20/2013   HGB 13.2 03/20/2013   HCT 37.4* 03/20/2013   PLT 294 03/20/2013   GLUCOSE 122* 03/20/2013   ALT 37 03/20/2013   AST 31 03/20/2013   NA 130* 03/20/2013   K 4.5 03/20/2013   CL 94* 03/20/2013   CREATININE 1.1 03/20/2013   BUN 14.5 03/20/2013   CO2 26 03/20/2013    No results found.  Blood smear review:   I personally  reviewed the patient's peripheral blood smear today.  There was isocytosis.  There was no peripheral blast.  There was no schistocytosis, spherocytosis, target cell, rouleaux formation, tear drop cell.  There was no giant platelets or platelet clumps.      ASSESSMENT AND PLAN:   1.  Issue:  Anemia with positive monoclonal  protein spike, neuropathy, orthostasis.  His neuropathy could be due to radiculopathy from DJD compressing on his nerve roots.  His orthostatic hypotension 4 years ago could be due to steroid related.  However, I need to rule out the following diagnosis. 2.  Potential diagnosis:  Multiple myeloma vs. amyloidosis to tie all the presentations together or diagnosis of anemia of chronic disease in addition to MGUS (monoclonal gammopathy of unknown significance). 3.  Work up:     *  24 hour urine collection to rule out myeloma protein in the urine.  *  Skeletal Xray to rule out bone lesions that are indicative of myeloma.  *  Bone marrow biopsy to rule out myeloma.   *  Echocardiogram to see if there are suggestions of amyloidosis.   Mitchell Fields expressed informed understanding and wished to proceed as recommended.   4.  Follow up:  In about 2 weeks to discuss result of work up.    The length of time of the face-to-face encounter was 45 minutes. More than 50% of time was spent counseling and coordination of care.     Thank you for this referral.

## 2013-03-21 ENCOUNTER — Telehealth: Payer: Self-pay | Admitting: Oncology

## 2013-03-22 LAB — PROTEIN ELECTROPHORESIS, SERUM
Alpha-1-Globulin: 4.1 % (ref 2.9–4.9)
Gamma Globulin: 5.8 % — ABNORMAL LOW (ref 11.1–18.8)
M-Spike, %: 0.68 g/dL

## 2013-03-22 LAB — KAPPA/LAMBDA LIGHT CHAINS: Kappa free light chain: 2.72 mg/dL — ABNORMAL HIGH (ref 0.33–1.94)

## 2013-03-25 ENCOUNTER — Other Ambulatory Visit: Payer: Self-pay | Admitting: Oncology

## 2013-03-25 DIAGNOSIS — D472 Monoclonal gammopathy: Secondary | ICD-10-CM

## 2013-03-26 ENCOUNTER — Ambulatory Visit (HOSPITAL_COMMUNITY)
Admission: RE | Admit: 2013-03-26 | Discharge: 2013-03-26 | Disposition: A | Discharge status: Home / Self Care | Payer: Medicare Other | Source: Ambulatory Visit | Attending: Oncology | Admitting: Oncology

## 2013-03-26 ENCOUNTER — Other Ambulatory Visit (HOSPITAL_COMMUNITY)
Admission: RE | Admit: 2013-03-26 | Discharge: 2013-03-26 | Disposition: A | Discharge status: Home / Self Care | Payer: Medicare Other | Source: Ambulatory Visit | Attending: Oncology | Admitting: Oncology

## 2013-03-26 ENCOUNTER — Ambulatory Visit (HOSPITAL_BASED_OUTPATIENT_CLINIC_OR_DEPARTMENT_OTHER): Payer: Medicare Other | Admitting: Oncology

## 2013-03-26 ENCOUNTER — Other Ambulatory Visit (HOSPITAL_BASED_OUTPATIENT_CLINIC_OR_DEPARTMENT_OTHER): Payer: Medicare Other | Admitting: Lab

## 2013-03-26 VITALS — BP 116/76 | HR 60 | Temp 98.4°F | Resp 18

## 2013-03-26 DIAGNOSIS — D472 Monoclonal gammopathy: Secondary | ICD-10-CM

## 2013-03-26 DIAGNOSIS — Z951 Presence of aortocoronary bypass graft: Secondary | ICD-10-CM | POA: Insufficient documentation

## 2013-03-26 DIAGNOSIS — I519 Heart disease, unspecified: Secondary | ICD-10-CM | POA: Insufficient documentation

## 2013-03-26 DIAGNOSIS — I951 Orthostatic hypotension: Secondary | ICD-10-CM

## 2013-03-26 DIAGNOSIS — D649 Anemia, unspecified: Secondary | ICD-10-CM | POA: Insufficient documentation

## 2013-03-26 DIAGNOSIS — I10 Essential (primary) hypertension: Secondary | ICD-10-CM | POA: Insufficient documentation

## 2013-03-26 DIAGNOSIS — IMO0002 Reserved for concepts with insufficient information to code with codable children: Secondary | ICD-10-CM | POA: Insufficient documentation

## 2013-03-26 DIAGNOSIS — I2510 Atherosclerotic heart disease of native coronary artery without angina pectoris: Secondary | ICD-10-CM | POA: Insufficient documentation

## 2013-03-26 DIAGNOSIS — E785 Hyperlipidemia, unspecified: Secondary | ICD-10-CM | POA: Insufficient documentation

## 2013-03-26 DIAGNOSIS — I080 Rheumatic disorders of both mitral and aortic valves: Secondary | ICD-10-CM | POA: Insufficient documentation

## 2013-03-26 DIAGNOSIS — I872 Venous insufficiency (chronic) (peripheral): Secondary | ICD-10-CM | POA: Insufficient documentation

## 2013-03-26 DIAGNOSIS — I700 Atherosclerosis of aorta: Secondary | ICD-10-CM | POA: Insufficient documentation

## 2013-03-26 DIAGNOSIS — M479 Spondylosis, unspecified: Secondary | ICD-10-CM | POA: Insufficient documentation

## 2013-03-26 DIAGNOSIS — D539 Nutritional anemia, unspecified: Secondary | ICD-10-CM

## 2013-03-26 DIAGNOSIS — I359 Nonrheumatic aortic valve disorder, unspecified: Secondary | ICD-10-CM

## 2013-03-26 LAB — CBC WITH DIFFERENTIAL/PLATELET
BASO%: 1.1 % (ref 0.0–2.0)
Eosinophils Absolute: 0.2 10*3/uL (ref 0.0–0.5)
LYMPH%: 29.2 % (ref 14.0–49.0)
MCHC: 36.7 g/dL — ABNORMAL HIGH (ref 32.0–36.0)
MCV: 92.1 fL (ref 79.3–98.0)
MONO#: 1.1 10*3/uL — ABNORMAL HIGH (ref 0.1–0.9)
MONO%: 18.2 % — ABNORMAL HIGH (ref 0.0–14.0)
NEUT#: 2.8 10*3/uL (ref 1.5–6.5)
Platelets: 225 10*3/uL (ref 140–400)
RBC: 3.43 10*6/uL — ABNORMAL LOW (ref 4.20–5.82)
RDW: 12.6 % (ref 11.0–14.6)
WBC: 5.8 10*3/uL (ref 4.0–10.3)

## 2013-03-26 LAB — UIFE/LIGHT CHAINS/TP QN, 24-HR UR
Albumin, U: DETECTED
Alpha 1, Urine: DETECTED — AB
Alpha 2, Urine: DETECTED — AB
Beta, Urine: DETECTED — AB
Gamma Globulin, Urine: DETECTED — AB
Total Protein, Urine-Ur/day: 21 mg/d (ref 10–140)

## 2013-03-26 LAB — BONE MARROW EXAM

## 2013-03-26 NOTE — Progress Notes (Signed)
Echo Lab  2D Echocardiogram completed.  Crist Kruszka L Colby Reels, RDCS 03/26/2013 10:34 AM

## 2013-03-26 NOTE — Patient Instructions (Addendum)
Bone Marrow Aspiration This is an examination in which a needle is inserted into a bone and a portion of the marrow in the bone is removed. This test confirms the diagnosis of anemias, leukemia, or myelomas. It helps determine the cause of decreased blood cells and deficient iron stores. It documents the process of metastasis if there has been a tumor which has spread to bone. It also helps study tumor staging, especially in lymphomas. There are two methods for sampling bone marrow, an aspirate and a biopsy. The bone marrow, a soft fatty tissue found inside the body's larger bones, is often described as being like a honeycomb  it is a fibrous network filled with a liquid containing cells in various stages of maturation, and "raw materials" such as iron, vitamin B12, and folate that are required for cell production. The bone marrow aspirate provides a liquid sample of cells that can be studied individually, and the biopsy collects a sample that preserves the marrow's structure and shows the relationships of bone marrow cells to one another and the relative amount of marrow cells compared to fat (cellularity).  Red blood cells (RBCs), platelets, and five different types of white blood cells (WBCs) are produced in the marrow as needed, with the number and type of cell being created at any one time based on the use of cells, loss, and a continual replacement of old cells. The bone marrow biopsy and aspiration provide information about the status of and capability for blood cell production. They are not routinely ordered and in fact the majority of people will never have one done. A marrow aspiration or biopsy may be ordered to help evaluate blood cell production, to help diagnose leukemia, to help diagnose a bone marrow disorder, to help diagnose and stage a variety of other types of cancer (to determine spread into the marrow), and to help determine whether a severe anemia is due to decreased RBC production,  increased loss, abnormal RBC production, or to a vitamin or mineral deficiency or excess. Conditions that affect the marrow can affect the number, mixture, and maturity of the cells, and can affect its fibrous structure. PREPARATION FOR TEST  No special preparation is needed. The bone marrow aspiration or biopsy procedure is performed by a caregiver. Both types of samples may be collected from the hip bone (pelvis), and marrow aspirations may be collected from the breastbone (sternum). In children, samples may also be collected from a vertebrae in the back or from the thigh bone (femur).  The most common collection site is the top ridge of the hip bone (iliac crest). Before the procedure, your blood pressure, heart rate, and temperature are measured and evaluated to make sure that they are within normal limits. You may be given a mild sedative. You will be asked to lie down on your stomach or side. Your lower body is draped with cloths so that only the area surrounding the site is exposed.  The site is cleaned with an antiseptic and injected with a local anesthetic. When the site is numb, the caregiver inserts a needle through the skin and into the bone. For an aspiration, the caregiver attaches a syringe to the needle and pulls back on the plunger. This creates vacuum pressure and pulls a small amount of marrow into the syringe.  For a bone marrow biopsy, the caregiver uses a special needle that allows the collection of a sample of bone and marrow. Even though your skin has been numbed, you may feel   brief but uncomfortable pressure sensations during these procedures. After the needle has been withdrawn, a sterile bandage is placed over the site and pressure is applied. You will then usually be instructed to lie quietly until your blood pressure, heart rate, and temperature are normal. You may be instructed to keep the site dry and covered for 48 hours.  Complications from the bone marrow aspiration or  biopsy procedure are rare, but some individuals may have excessive bleeding at the site or develop an infection.  Tell your caregiver about any allergies you have and about any medications or supplements you are taking prior to the procedure. NORMAL FINDINGS Cell type / Range (%)  Myeloblasts / less than 5  Promyelocytes / 1-8  Myelocytes  Neutrophilic / 5-15  Eosinophilic / 0.5-3  Basophilic / less than 1  Metamyelocytes  Neutrophilic / 15-25  Eosinophilic / less than 1  Basophilic / less than 1  Mature myelocytes  Neutrophilic / 10-30  Eosinophilic / less than 5  Basophilic / less than 5  Mononuclear  Monocytes / less than 5  Lymphocytes / 3-20  Plasma cells / less than 1  Megakaryocytes / less than 5  M/E ratio / less than 4  Normoblasts / 25-50 Normal iron content is demonstrated by staining with Prussian blue. Ranges for normal findings may vary among different laboratories and hospitals. You should always check with your caregiver after having lab work or other tests done to discuss the meaning of your test results and whether your values are considered within normal limits. MEANING OF TEST  Your caregiver will go over the test results with you and discuss the importance and meaning of your results, as well as treatment options and the need for additional tests if necessary. OBTAINING THE TEST RESULTS It is your responsibility to obtain your test results. Ask the lab or department performing the test when and how you will get your results. Document Released: 10/12/2004 Document Revised: 12/12/2011 Document Reviewed: 08/26/2008 Exit[employer] Patient Information 2014 [employer].  

## 2013-03-26 NOTE — Procedures (Signed)
   [facility]  Telephone:(336) [phone] Fax:(336) [phone]   BONE MARROW BIOPSY AND ASPIRATION   INDICATION: monoclonal gammopathy, neuropathy; ruling out myeloma, amyloidosis.  Procedure: After obtained from consent, Mitchell Fields was placed in the prone position. Time out was performed verifying correct patient and procedure. The skin overlying the left posterior crest was prepped with Betadine and draped in the usual sterile fashion. The skin and periosteum were infiltrated with 20 mL of 2% lidocaine. A small puncture wound was made with #11 scalpel blade.  Bone marrow aspirate was obtained on the first pass of the aspiration needle.   One core biopsy was obtained through the same incision.   The aspirate was sent for routine histology, flow cytometry, and cytogenetics.  Core biopsy was sent for routine histology.   Mitchell Fields tolerated procedure well with minimal  blood loss and without immediate complication.   A sterile dressing was applied.   Jethro Bolus M.D. 03/26/2013

## 2013-03-26 NOTE — Progress Notes (Signed)
Please see same day bone marrow biopsy.

## 2013-03-26 NOTE — Progress Notes (Signed)
Post bone marrow discharge instructions explained to pt.  AVS given to pt.  Right iliac crest occlusive dressing clean,dry and intact.  Pt was stable at discharge via ambulation with nurse to lobby to meet wife.

## 2013-03-27 ENCOUNTER — Encounter: Payer: Self-pay | Admitting: Internal Medicine

## 2013-04-03 ENCOUNTER — Ambulatory Visit (HOSPITAL_BASED_OUTPATIENT_CLINIC_OR_DEPARTMENT_OTHER): Payer: Medicare Other | Admitting: Oncology

## 2013-04-03 VITALS — BP 120/72 | HR 70 | Temp 98.0°F | Resp 18 | Ht 69.0 in | Wt 145.7 lb

## 2013-04-03 DIAGNOSIS — D472 Monoclonal gammopathy: Secondary | ICD-10-CM

## 2013-04-03 NOTE — Patient Instructions (Addendum)
1.  Diagnosis:  MGUS (monoclonal gammopathy of unknown significance).  There is no evidence of active myeloma.  There was only 2% plasma on bone marrow biopsy (this is normal %).  There was no anemia, kidney failure, hypercalcemia, or bone lesion on Xray.  2.  Prognosis:  About 1% with MGUS progresses to active myeloma per year.  There is no indication to treat with chemo in patients with MGUS. 3.  Follow up:  Lab check in about 4 months and visit the week after.  In the future, if the M-spike protein significantly increases or there is severe anemia, kidney failure, hypercalcemia, then repeat work up is indicated.

## 2013-04-03 NOTE — Progress Notes (Signed)
[facility]  Telephone:(336) [phone] Fax:(336) [phone]   OFFICE PROGRESS NOTE   Cc:  Kari Baars, MD  DIAGNOSIS:  MGUS.   CURRENT THERAPY: watchful observation.   INTERVAL HISTORY: Mitchell Fields 77 y.o. male returns to clinic with his wife to discuss result of work up.  He still has mild fatigue.  He is independent of all activities of daily living.  However, he does not have the same level of stamina that he did a few years ago.  He was tried on testosterone patch.  He said that it did not improve his strength and therefore he discontinued it.  He did not know whether his testosterone level increased with testosterone patch or not before he stopped it.   Patient denies fever, anorexia, weight loss, headache, visual changes, confusion, drenching night sweats, palpable lymph node swelling, mucositis, odynophagia, dysphagia, nausea vomiting, jaundice, chest pain, palpitation, shortness of breath, dyspnea on exertion, productive cough, gum bleeding, epistaxis, hematemesis, hemoptysis, abdominal pain, abdominal swelling, early satiety, melena, hematochezia, hematuria, skin rash, spontaneous bleeding, joint swelling, joint pain, heat or cold intolerance, bowel bladder incontinence, back pain, focal motor weakness, paresthesia, depression.    Past Medical History  Diagnosis Date  . Hyperlipidemia   . Hypertension   . Arthritis   . Coronary artery disease     TWO VESSEL BYPASS SURGERY in January 1995  . Back pain   . Diverticulosis   . Bursitis of left hip   . Anemia   . Monoclonal gammopathy   . Neuropathy     Past Surgical History  Procedure Laterality Date  . Coronary artery bypass graft      X2. LIMA GRAFT AND A RIMA GRAFT.  . Back surgery  1985, 1995, 2002    X3  . Inguinal hernia repair Left   . Negative stress echo  08/11/2009  . US echocardiography  June 2012    Normal EF, mild aortic regurgitation, mild MR, LAE, mildly dilated RV, RAE, increased left  atrial pressure, moderate to severe TR and mildly increased  PA systolic pressure.   . Adenoidectomy      Current Outpatient Prescriptions  Medication Sig Dispense Refill  . amLODipine (NORVASC) 2.5 MG tablet Take 2.5 mg by mouth daily.      . Ascorbic Acid (VITAMIN C) 100 MG tablet Take 100 mg by mouth daily.      Marland Kitchen aspirin 325 MG tablet Take 162.5 mg by mouth daily.        Marland Kitchen atenolol (TENORMIN) 25 MG tablet Take 25 mg by mouth daily.      Marland Kitchen atorvastatin (LIPITOR) 20 MG tablet Take 20 mg by mouth daily.      . B Complex Vitamins (VITAMIN B COMPLEX PO) Take 1 tablet by mouth daily.      . Cholecalciferol (VITAMIN D PO) Take by mouth daily.        Marland Kitchen ezetimibe (ZETIA) 10 MG tablet Take 10 mg by mouth daily.        Marland Kitchen HYDROcodone-acetaminophen (VICODIN) 5-500 MG per tablet Take 0.5 tablets by mouth as needed for pain (3-4 per week).       Marland Kitchen levothyroxine (SYNTHROID, LEVOTHROID) 50 MCG tablet Take 50 mcg by mouth daily before breakfast.      . quinapril (ACCUPRIL) 20 MG tablet Take 20 mg by mouth at bedtime.       No current facility-administered medications for this visit.    ALLERGIES:  is allergic to cortisone.  REVIEW  OF SYSTEMS:  The rest of the 14-point review of system was negative.   Filed Vitals:   04/03/13 1328  BP: 120/72  Pulse: 70  Temp: 98 F (36.7 C)  Resp: 18   Wt Readings from Last 3 Encounters:  04/03/13 145 lb 11.2 oz (66.089 kg)  03/20/13 148 lb 1.6 oz (67.178 kg)  03/11/13 148 lb (67.132 kg)   ECOG Performance status: 1  PHYSICAL EXAMINATION was deferred due to no new symptoms from last visit.      LABORATORY/RADIOLOGY DATA:  Lab Results  Component Value Date   WBC 5.8 03/26/2013   HGB 11.6* 03/26/2013   HCT 31.6* 03/26/2013   PLT 225 03/26/2013   GLUCOSE 122* 03/20/2013   ALKPHOS 65 03/20/2013   ALT 37 03/20/2013   AST 31 03/20/2013   NA 130* 03/20/2013   K 4.5 03/20/2013   CL 94* 03/20/2013   CREATININE 1.1 03/20/2013   BUN 14.5 03/20/2013   CO2 26  03/20/2013    Dg Bone Survey Met  03/26/2013   *RADIOLOGY REPORT*  Clinical Data: History of positive monoclonal spike.  Evaluate for possible lytic bone lesions.  METASTATIC BONE SURVEY  Comparison: CT 01/31/2013.  MRI 05/22/2012.  Findings: Calvarium appears intact.  Small right pleural radiolucent area appears to represent a venous lake.  No definite lytic areas are seen.  Paraspinal cervical calcifications may be vascular.  Degenerative disc disease and degenerative spondylosis changes are seen. Previous median sternotomy has been performed.  There is slight lumbar scoliosis convexity to the left. Nonaneurysmal aortic and arterial calcifications are present.  No definite lytic areas are seen within the axial or appendicular skeleton. Small radiolucent areas within the ischial rami half sclerotic peripheral margins consistent with benign cystic change. Old healed right rib fractures appear stable.  Venous stasis calcifications are present.  Nonaneurysmal arterial calcifications are present.  IMPRESSION: No definite lytic lesions are seen in the axial or appendicular skeleton.  Chronic findings are detailed above.   Original Report Authenticated By: Onalee Hua Call       ASSESSMENT AND PLAN:    1.  Diagnosis:  MGUS (monoclonal gammopathy of unknown significance).  There is no evidence of active myeloma.  There was only 2% plasma on bone marrow biopsy (this is normal %).  There was no anemia, kidney failure, hypercalcemia, or bone lesion on Xray.  2.  Prognosis:  About 1% with MGUS progresses to active myeloma per year.  There is no indication to treat with chemo in patients with MGUS. 3.  Follow up:  Lab check in about 4 months and visit the week after.  In the future, if the M-spike protein significantly increases or there is severe anemia, kidney failure, hypercalcemia, then repeat work up is indicated.  4.  Fatigue:  Not due to myeloma. I encouraged him to discuss with his PCP to see if he may benefit  from other formulation of testosterone.  5.  History of orthostatic hypotension:  Not related to myeloma.  Echo showed 45% EF and grade 1 diastolic dysfunction.  There is no evidence of amyloidosis as he did not have proteinuria or significantly elevated lambda light chain.  Management per PCP.  6.  Neuropathy:  Most likely radiculopathy; not related to myeloma as he only has MGUS. Manegement per PCP.     I informed Mr. Soward that I am leaving the practice.  [facility] for [facility] to see another provider when he returns.   The  length of time of the face-to-face encounter was 15 minutes. More than 50% of time was spent counseling and coordination of care.

## 2013-04-04 LAB — CHROMOSOME ANALYSIS, BONE MARROW

## 2013-04-24 ENCOUNTER — Telehealth: Payer: Self-pay | Admitting: *Deleted

## 2013-04-24 NOTE — Telephone Encounter (Signed)
Lm gv appt for Dr. Karel Jarvis for 08/05/13. i also informed him to still attend the lab appt that was made by [facility]. Pt is aware that i will mail a letter/avs as well...td

## 2013-05-01 ENCOUNTER — Encounter: Payer: Self-pay | Admitting: Oncology

## 2013-05-08 ENCOUNTER — Other Ambulatory Visit: Payer: Self-pay

## 2013-07-26 ENCOUNTER — Telehealth: Payer: Self-pay | Admitting: *Deleted

## 2013-07-26 NOTE — Telephone Encounter (Signed)
sw pt informed him that his appt for 08/05/13 has changed to 08/12/13 w/labs@ 11:30am and ov @ 12 noon. Pt is aware.Marland Kitchentd

## 2013-08-02 ENCOUNTER — Telehealth: Payer: Self-pay | Admitting: Hematology and Oncology

## 2013-08-02 NOTE — Telephone Encounter (Signed)
pt rs 11/10 appts to 11/18 due to conflict shh

## 2013-08-05 ENCOUNTER — Other Ambulatory Visit: Payer: Medicare Other

## 2013-08-05 ENCOUNTER — Ambulatory Visit: Payer: Medicare Other

## 2013-08-08 ENCOUNTER — Other Ambulatory Visit: Payer: Self-pay

## 2013-08-12 ENCOUNTER — Other Ambulatory Visit: Payer: Medicare Other | Admitting: Lab

## 2013-08-12 ENCOUNTER — Ambulatory Visit: Payer: Medicare Other | Admitting: Hematology and Oncology

## 2013-08-20 ENCOUNTER — Other Ambulatory Visit: Payer: Medicare Other | Admitting: Lab

## 2013-08-20 ENCOUNTER — Ambulatory Visit (HOSPITAL_BASED_OUTPATIENT_CLINIC_OR_DEPARTMENT_OTHER): Payer: Medicare Other | Admitting: Hematology and Oncology

## 2013-08-20 ENCOUNTER — Telehealth: Payer: Self-pay | Admitting: Hematology and Oncology

## 2013-08-20 ENCOUNTER — Encounter: Payer: Self-pay | Admitting: Hematology and Oncology

## 2013-08-20 ENCOUNTER — Ambulatory Visit (HOSPITAL_BASED_OUTPATIENT_CLINIC_OR_DEPARTMENT_OTHER): Payer: Medicare Other | Admitting: Lab

## 2013-08-20 VITALS — BP 148/82 | HR 71 | Temp 97.2°F | Resp 20 | Ht 69.0 in | Wt 152.4 lb

## 2013-08-20 DIAGNOSIS — D472 Monoclonal gammopathy: Secondary | ICD-10-CM

## 2013-08-20 DIAGNOSIS — D649 Anemia, unspecified: Secondary | ICD-10-CM

## 2013-08-20 LAB — CBC WITH DIFFERENTIAL/PLATELET
BASO%: 0.9 % (ref 0.0–2.0)
EOS%: 1.7 % (ref 0.0–7.0)
HCT: 38.8 % (ref 38.4–49.9)
LYMPH%: 26.7 % (ref 14.0–49.0)
MCHC: 34.9 g/dL (ref 32.0–36.0)
MCV: 96.1 fL (ref 79.3–98.0)
MONO#: 0.6 10*3/uL (ref 0.1–0.9)
MONO%: 11 % (ref 0.0–14.0)
Platelets: 231 10*3/uL (ref 140–400)
RBC: 4.04 10*6/uL — ABNORMAL LOW (ref 4.20–5.82)
RDW: 12.1 % (ref 11.0–14.6)
WBC: 5.7 10*3/uL (ref 4.0–10.3)

## 2013-08-20 LAB — COMPREHENSIVE METABOLIC PANEL (CC13)
ALT: 35 U/L (ref 0–55)
Alkaline Phosphatase: 75 U/L (ref 40–150)
CO2: 28 mEq/L (ref 22–29)
Potassium: 3.9 mEq/L (ref 3.5–5.1)
Sodium: 136 mEq/L (ref 136–145)
Total Bilirubin: 0.65 mg/dL (ref 0.20–1.20)
Total Protein: 7.9 g/dL (ref 6.4–8.3)

## 2013-08-20 NOTE — Telephone Encounter (Signed)
gv and printed appt sched adn avs forpt for June...pt requested June appt....sent pt to lab

## 2013-08-20 NOTE — Progress Notes (Signed)
[facility] OFFICE PROGRESS NOTE  Patient Care Team: Kari Baars, MD as PCP - General (Internal Medicine) Louis Meckel, MD as Attending Physician (Gastroenterology) Peter M [place], MD as Attending Physician (Cardiology) Rocco Serene, MD as Consulting Physician (Dermatology) Provider Not In System  DIAGNOSIS: Monoclonal gammopathy of unknown significance, IgA kappa subtype  SUMMARY OF ONCOLOGIC HISTORY: This is a pleasant 77 year old gentleman who was found to have anemia and abnormal M spike. He had further workup including blood work, urine test, skeletal survey as well as bone marrow aspirate and biopsy. He was placed on observation.  INTERVAL HISTORY: Mitchell Fields 77 y.o. male returns for further followup. He denies any bone pain or bone fractures. He denies any recent fever, chills, night sweats or abnormal weight loss The patient denies any recent signs or symptoms of bleeding such as spontaneous epistaxis, hematuria or hematochezia.  I have reviewed the past medical history, past surgical history, social history and family history with the patient and they are unchanged from previous note.  ALLERGIES:  is allergic to cortisone.  MEDICATIONS:  Current Outpatient Prescriptions  Medication Sig Dispense Refill  . amLODipine (NORVASC) 2.5 MG tablet Take 2.5 mg by mouth daily.      . Ascorbic Acid (VITAMIN C) 100 MG tablet Take 100 mg by mouth daily.      Marland Kitchen aspirin 325 MG tablet Take 162.5 mg by mouth daily.        Marland Kitchen atenolol (TENORMIN) 25 MG tablet Take 25 mg by mouth daily.      Marland Kitchen atorvastatin (LIPITOR) 20 MG tablet Take 10 mg by mouth daily.       . B Complex Vitamins (VITAMIN B COMPLEX PO) Take 1 tablet by mouth daily.      . Cholecalciferol (VITAMIN D PO) Take by mouth daily.        Marland Kitchen ezetimibe (ZETIA) 10 MG tablet Take 10 mg by mouth daily.        Marland Kitchen HYDROcodone-acetaminophen (VICODIN) 5-500 MG per tablet Take 0.5 tablets by mouth as needed for pain (3-4  per week).       Marland Kitchen levothyroxine (SYNTHROID, LEVOTHROID) 50 MCG tablet Take 50 mcg by mouth daily before breakfast.      . quinapril (ACCUPRIL) 20 MG tablet Take 20 mg by mouth at bedtime.       No current facility-administered medications for this visit.    REVIEW OF SYSTEMS:   Constitutional: Denies fevers, chills or abnormal weight loss Eyes: Denies blurriness of vision Ears, nose, mouth, throat, and face: Denies mucositis or sore throat Respiratory: Denies cough, dyspnea or wheezes Cardiovascular: Denies palpitation, chest discomfort or lower extremity swelling Gastrointestinal:  Denies nausea, heartburn or change in bowel habits Skin: Denies abnormal skin rashes Lymphatics: Denies new lymphadenopathy or easy bruising Neurological:Denies numbness, tingling or new weaknesses Behavioral/Psych: Mood is stable, no new changes  All other systems were reviewed with the patient and are negative.  PHYSICAL EXAMINATION: ECOG PERFORMANCE STATUS: 0 - Asymptomatic  Filed Vitals:   08/20/13 1233  BP: 148/82  Pulse: 71  Temp: 97.2 F (36.2 C)  Resp: 20   Filed Weights   08/20/13 1233  Weight: 152 lb 6.4 oz (69.128 kg)    GENERAL:alert, no distress and comfortable SKIN: skin color, texture, turgor are normal, no rashes or significant lesions EYES: normal, Conjunctiva are pink and non-injected, sclera clear OROPHARYNX:no exudate, no erythema and lips, buccal mucosa, and tongue normal  NECK: supple, thyroid normal size,  non-tender, without nodularity LYMPH:  no palpable lymphadenopathy in the cervical, axillary or inguinal LUNGS: clear to auscultation and percussion with normal breathing effort HEART: regular rate & rhythm and no murmurs and no lower extremity edema ABDOMEN:abdomen soft, non-tender and normal bowel sounds Musculoskeletal:no cyanosis of digits and no clubbing  NEURO: alert & oriented x 3 with fluent speech, no focal motor/sensory deficits  LABORATORY DATA:  I have  reviewed the data as listed    Component Value Date/Time   NA 130* 03/20/2013 1510   NA 129* 01/28/2013 1553   K 4.5 03/20/2013 1510   K 3.8 01/28/2013 1553   CL 94* 03/20/2013 1510   CL 93* 01/28/2013 1553   CO2 26 03/20/2013 1510   CO2 29 01/28/2013 1553   GLUCOSE 122* 03/20/2013 1510   GLUCOSE 124* 01/28/2013 1553   BUN 14.5 03/20/2013 1510   BUN 17 01/28/2013 1553   CREATININE 1.1 03/20/2013 1510   CREATININE 1.1 01/28/2013 1553   CALCIUM 10.0 03/20/2013 1510   CALCIUM 8.7 01/28/2013 1553   PROT 7.6 03/20/2013 1510   PROT 6.8 01/28/2013 1553   ALBUMIN 3.8 03/20/2013 1510   ALBUMIN 3.4* 01/28/2013 1553   AST 31 03/20/2013 1510   AST 23 01/28/2013 1553   ALT 37 03/20/2013 1510   ALT 23 01/28/2013 1553   ALKPHOS 65 03/20/2013 1510   ALKPHOS 72 01/28/2013 1553   BILITOT 0.77 03/20/2013 1510   BILITOT 0.9 01/28/2013 1553    No results found for this basename: SPEP, UPEP,  kappa and lambda light chains    Lab Results  Component Value Date   WBC 5.8 03/26/2013   NEUTROABS 2.8 03/26/2013   HGB 11.6* 03/26/2013   HCT 31.6* 03/26/2013   MCV 92.1 03/26/2013   PLT 225 03/26/2013      Chemistry      Component Value Date/Time   NA 130* 03/20/2013 1510   NA 129* 01/28/2013 1553   K 4.5 03/20/2013 1510   K 3.8 01/28/2013 1553   CL 94* 03/20/2013 1510   CL 93* 01/28/2013 1553   CO2 26 03/20/2013 1510   CO2 29 01/28/2013 1553   BUN 14.5 03/20/2013 1510   BUN 17 01/28/2013 1553   CREATININE 1.1 03/20/2013 1510   CREATININE 1.1 01/28/2013 1553      Component Value Date/Time   CALCIUM 10.0 03/20/2013 1510   CALCIUM 8.7 01/28/2013 1553   ALKPHOS 65 03/20/2013 1510   ALKPHOS 72 01/28/2013 1553   AST 31 03/20/2013 1510   AST 23 01/28/2013 1553   ALT 37 03/20/2013 1510   ALT 23 01/28/2013 1553   BILITOT 0.77 03/20/2013 1510   BILITOT 0.9 01/28/2013 1553     ASSESSMENT & PLAN:  #1 IgA kappa monoclonal gammopathy of unknown significance I will order some blood work today. History and physical examination is  satisfactory and I do not think he had disease progression. I will see him back again in 6 months time and at that point in time we'll do blood work, urine test as a skeletal survey. #2 anemia This is likely anemia of chronic disease. The patient denies recent history of bleeding such as epistaxis, hematuria or hematochezia. He is asymptomatic from the anemia. We will observe for now.  He does not require transfusion now.  #3 preventive care He has received influenza vaccination. I recommend the patient to take vitamin D supplements. I also recommend the patient to avoid excessive sun exposure due to risk of skin cancer.  Orders Placed This Encounter  Procedures  . DG Bone Survey Met    Standing Status: Future     Number of Occurrences:      Standing Expiration Date: 10/20/2014    Order Specific Question:  Reason for Exam (SYMPTOM  OR DIAGNOSIS REQUIRED)    Answer:  staging myeloma    Order Specific Question:  Preferred imaging location?    Answer:  [facility]  . CBC with Differential    Standing Status: Standing     Number of Occurrences: 2     Standing Expiration Date: 08/20/2014  . Comprehensive metabolic panel    Standing Status: Standing     Number of Occurrences: 2     Standing Expiration Date: 08/20/2014  . Beta 2 microglobuline, serum    Standing Status: Standing     Number of Occurrences: 2     Standing Expiration Date: 08/20/2014  . IFE, Urine (with Tot Prot)    Standing Status: Future     Number of Occurrences:      Standing Expiration Date: 08/20/2014  . SPEP & IFE with QIG    Standing Status: Standing     Number of Occurrences: 2     Standing Expiration Date: 08/20/2014  . Protein Electro, 24-Hour Urine    Standing Status: Future     Number of Occurrences:      Standing Expiration Date: 08/20/2014  . Kappa/lambda light chains    Standing Status: Standing     Number of Occurrences: 2     Standing Expiration Date: 08/20/2014  . Immunofixation  interpretive, urine    Standing Status: Future     Number of Occurrences:      Standing Expiration Date: 08/20/2014   All questions were answered. The patient knows to call the clinic with any problems, questions or concerns. No barriers to learning was detected. I spent 15 minutes counseling the patient face to face. The total time spent in the appointment was 20 minutes and more than 50% was on counseling and review of test results     [facility], Gerilynn Mccullars, MD 08/20/2013 12:57 PM

## 2013-08-22 LAB — SPEP & IFE WITH QIG
Albumin ELP: 57 % (ref 55.8–66.1)
Alpha-1-Globulin: 6.7 % — ABNORMAL HIGH (ref 2.9–4.9)
Beta Globulin: 6.7 % (ref 4.7–7.2)
IgM, Serum: 22 mg/dL — ABNORMAL LOW (ref 41–251)

## 2013-08-22 LAB — KAPPA/LAMBDA LIGHT CHAINS
Kappa free light chain: 2.9 mg/dL — ABNORMAL HIGH (ref 0.33–1.94)
Kappa:Lambda Ratio: 1.17 (ref 0.26–1.65)
Lambda Free Lght Chn: 2.47 mg/dL (ref 0.57–2.63)

## 2014-02-14 ENCOUNTER — Other Ambulatory Visit: Payer: Self-pay | Admitting: Internal Medicine

## 2014-02-14 DIAGNOSIS — R911 Solitary pulmonary nodule: Secondary | ICD-10-CM

## 2014-03-06 ENCOUNTER — Other Ambulatory Visit (HOSPITAL_COMMUNITY): Payer: Medicare Other

## 2014-03-06 ENCOUNTER — Other Ambulatory Visit: Payer: Medicare Other | Admitting: Lab

## 2014-03-07 ENCOUNTER — Other Ambulatory Visit (HOSPITAL_BASED_OUTPATIENT_CLINIC_OR_DEPARTMENT_OTHER): Payer: Medicare Other

## 2014-03-07 ENCOUNTER — Ambulatory Visit (HOSPITAL_COMMUNITY)
Admission: RE | Admit: 2014-03-07 | Discharge: 2014-03-07 | Disposition: A | Discharge status: Home / Self Care | Payer: Medicare Other | Source: Ambulatory Visit | Attending: Hematology and Oncology | Admitting: Hematology and Oncology

## 2014-03-07 DIAGNOSIS — I6529 Occlusion and stenosis of unspecified carotid artery: Secondary | ICD-10-CM | POA: Insufficient documentation

## 2014-03-07 DIAGNOSIS — D472 Monoclonal gammopathy: Secondary | ICD-10-CM

## 2014-03-07 DIAGNOSIS — I658 Occlusion and stenosis of other precerebral arteries: Secondary | ICD-10-CM | POA: Insufficient documentation

## 2014-03-07 DIAGNOSIS — D649 Anemia, unspecified: Secondary | ICD-10-CM

## 2014-03-07 DIAGNOSIS — C9000 Multiple myeloma not having achieved remission: Secondary | ICD-10-CM | POA: Diagnosis not present

## 2014-03-07 LAB — COMPREHENSIVE METABOLIC PANEL (CC13)
ALK PHOS: 66 U/L (ref 40–150)
ALT: 33 U/L (ref 0–55)
AST: 33 U/L (ref 5–34)
Albumin: 3.7 g/dL (ref 3.5–5.0)
Anion Gap: 14 mEq/L — ABNORMAL HIGH (ref 3–11)
BUN: 15.7 mg/dL (ref 7.0–26.0)
CALCIUM: 8.8 mg/dL (ref 8.4–10.4)
CHLORIDE: 100 meq/L (ref 98–109)
CO2: 21 mEq/L — ABNORMAL LOW (ref 22–29)
CREATININE: 1.3 mg/dL (ref 0.7–1.3)
Glucose: 139 mg/dl (ref 70–140)
Potassium: 4.1 mEq/L (ref 3.5–5.1)
Sodium: 135 mEq/L — ABNORMAL LOW (ref 136–145)
Total Bilirubin: 0.55 mg/dL (ref 0.20–1.20)
Total Protein: 7.1 g/dL (ref 6.4–8.3)

## 2014-03-07 LAB — CBC WITH DIFFERENTIAL/PLATELET
BASO%: 0.7 % (ref 0.0–2.0)
BASOS ABS: 0 10*3/uL (ref 0.0–0.1)
EOS%: 3 % (ref 0.0–7.0)
Eosinophils Absolute: 0.2 10*3/uL (ref 0.0–0.5)
HEMATOCRIT: 37.1 % — AB (ref 38.4–49.9)
HGB: 12.8 g/dL — ABNORMAL LOW (ref 13.0–17.1)
LYMPH%: 32.1 % (ref 14.0–49.0)
MCH: 33.1 pg (ref 27.2–33.4)
MCHC: 34.4 g/dL (ref 32.0–36.0)
MCV: 96.2 fL (ref 79.3–98.0)
MONO#: 0.7 10*3/uL (ref 0.1–0.9)
MONO%: 12.3 % (ref 0.0–14.0)
NEUT#: 3.1 10*3/uL (ref 1.5–6.5)
NEUT%: 51.9 % (ref 39.0–75.0)
PLATELETS: 265 10*3/uL (ref 140–400)
RBC: 3.86 10*6/uL — ABNORMAL LOW (ref 4.20–5.82)
RDW: 12 % (ref 11.0–14.6)
WBC: 5.9 10*3/uL (ref 4.0–10.3)
lymph#: 1.9 10*3/uL (ref 0.9–3.3)

## 2014-03-11 LAB — UPEP/TP, 24-HR URINE
COLLECTION INTERVAL: 24 h
TOTAL PROTEIN, URINE: 4 mg/dL
Total Protein, Urine/Day: 60 mg/d (ref 50–100)
Total Volume, Urine: 1500 mL

## 2014-03-11 LAB — SPEP & IFE WITH QIG
ALBUMIN ELP: 56.7 % (ref 55.8–66.1)
Alpha-1-Globulin: 6.7 % — ABNORMAL HIGH (ref 2.9–4.9)
Alpha-2-Globulin: 10.7 % (ref 7.1–11.8)
BETA 2: 14 % — AB (ref 3.2–6.5)
Beta Globulin: 6.4 % (ref 4.7–7.2)
Gamma Globulin: 5.5 % — ABNORMAL LOW (ref 11.1–18.8)
IGG (IMMUNOGLOBIN G), SERUM: 1060 mg/dL (ref 650–1600)
IGM, SERUM: 22 mg/dL — AB (ref 41–251)
IgA: 292 mg/dL (ref 68–379)
M-Spike, %: 0.57 g/dL
TOTAL PROTEIN, SERUM ELECTROPHOR: 7 g/dL (ref 6.0–8.3)

## 2014-03-11 LAB — UIFE/LIGHT CHAINS/TP QN, 24-HR UR
ALPHA 2 UR: DETECTED — AB
Albumin, U: DETECTED
Alpha 1, Urine: DETECTED — AB
Beta, Urine: DETECTED — AB
FREE KAPPA/LAMBDA RATIO: 34.36 ratio — AB (ref 2.04–10.37)
FREE LT CHN EXCR RATE: 72.15 mg/d
Free Kappa Lt Chains,Ur: 4.81 mg/dL — ABNORMAL HIGH (ref 0.14–2.42)
Free Lambda Excretion/Day: 2.1 mg/d
Free Lambda Lt Chains,Ur: 0.14 mg/dL (ref 0.02–0.67)
Gamma Globulin, Urine: DETECTED — AB
TOTAL PROTEIN, URINE-UR/DAY: 83 mg/d (ref 10–140)
Time: 24 hours
Total Protein, Urine: 5.5 mg/dL
Volume, Urine: 1500 mL

## 2014-03-11 LAB — KAPPA/LAMBDA LIGHT CHAINS
KAPPA LAMBDA RATIO: 1.7 — AB (ref 0.26–1.65)
Kappa free light chain: 3.22 mg/dL — ABNORMAL HIGH (ref 0.33–1.94)
LAMBDA FREE LGHT CHN: 1.89 mg/dL (ref 0.57–2.63)

## 2014-03-11 LAB — BETA 2 MICROGLOBULIN, SERUM: Beta-2 Microglobulin: 3.73 mg/L — ABNORMAL HIGH (ref <=2.51)

## 2014-03-12 ENCOUNTER — Ambulatory Visit
Admission: RE | Admit: 2014-03-12 | Discharge: 2014-03-12 | Disposition: A | Discharge status: Home / Self Care | Payer: Medicare Other | Source: Ambulatory Visit | Attending: Internal Medicine | Admitting: Internal Medicine

## 2014-03-12 DIAGNOSIS — R911 Solitary pulmonary nodule: Secondary | ICD-10-CM

## 2014-03-13 ENCOUNTER — Ambulatory Visit (HOSPITAL_BASED_OUTPATIENT_CLINIC_OR_DEPARTMENT_OTHER): Payer: Medicare Other | Admitting: Hematology and Oncology

## 2014-03-13 ENCOUNTER — Encounter: Payer: Self-pay | Admitting: Hematology and Oncology

## 2014-03-13 VITALS — BP 128/65 | HR 65 | Temp 98.4°F | Resp 20 | Ht 69.0 in | Wt 155.5 lb

## 2014-03-13 DIAGNOSIS — D472 Monoclonal gammopathy: Secondary | ICD-10-CM | POA: Diagnosis not present

## 2014-03-13 DIAGNOSIS — D649 Anemia, unspecified: Secondary | ICD-10-CM

## 2014-03-13 DIAGNOSIS — R918 Other nonspecific abnormal finding of lung field: Secondary | ICD-10-CM

## 2014-03-13 DIAGNOSIS — D630 Anemia in neoplastic disease: Secondary | ICD-10-CM

## 2014-03-13 DIAGNOSIS — D638 Anemia in other chronic diseases classified elsewhere: Secondary | ICD-10-CM | POA: Insufficient documentation

## 2014-03-13 NOTE — Progress Notes (Signed)
[place] OFFICE PROGRESS NOTE  Patient Care Team: Marton Redwood, MD as PCP - General (Internal Medicine) Inda Castle, MD as Attending Physician (Gastroenterology) Peter M Martinique, MD as Attending Physician (Cardiology) Smith Mince, MD as Consulting Physician (Dermatology) Provider Not In System Heath Lark, MD as Consulting Physician (Hematology and Oncology) DIAGNOSIS: Monoclonal gammopathy of unknown significance, IgA kappa subtype  SUMMARY OF ONCOLOGIC HISTORY: This is a pleasant gentleman who was found to have anemia and abnormal M spike. He had further workup including blood work, urine test, skeletal survey as well as bone marrow aspirate and biopsy. He was placed on observation.   INTERVAL HISTORY: Please see below for problem oriented charting. He feels well. He denies any recent infection. No bone pain.  REVIEW OF SYSTEMS:   Constitutional: Denies fevers, chills or abnormal weight loss Eyes: Denies blurriness of vision Ears, nose, mouth, throat, and face: Denies mucositis or sore throat Respiratory: Denies cough, dyspnea or wheezes Cardiovascular: Denies palpitation, chest discomfort or lower extremity swelling Gastrointestinal:  Denies nausea, heartburn or change in bowel habits Skin: Denies abnormal skin rashes Lymphatics: Denies new lymphadenopathy or easy bruising Neurological:Denies numbness, tingling or new weaknesses Behavioral/Psych: Mood is stable, no new changes  All other systems were reviewed with the patient and are negative.  I have reviewed the past medical history, past surgical history, social history and family history with the patient and they are unchanged from previous note.  ALLERGIES:  is allergic to cortisone.  MEDICATIONS:  Current Outpatient Prescriptions  Medication Sig Dispense Refill  . amLODipine (NORVASC) 2.5 MG tablet Take 2.5 mg by mouth daily.      . Ascorbic Acid (VITAMIN C) 100 MG tablet Take 100 mg by mouth  daily.      Marland Kitchen aspirin 81 MG tablet Take 81 mg by mouth daily.      Marland Kitchen atenolol (TENORMIN) 25 MG tablet Take 25 mg by mouth daily.      Marland Kitchen atorvastatin (LIPITOR) 20 MG tablet Take 10 mg by mouth daily.       . B Complex Vitamins (VITAMIN B COMPLEX PO) Take 1 tablet by mouth daily.      . Cholecalciferol (VITAMIN D PO) Take 5,000 Units by mouth daily.       Marland Kitchen ezetimibe (ZETIA) 10 MG tablet Take 10 mg by mouth daily.        Marland Kitchen gabapentin (NEURONTIN) 300 MG capsule Take 300 mg by mouth daily as needed.      Marland Kitchen HYDROcodone-acetaminophen (VICODIN) 5-500 MG per tablet Take 0.5 tablets by mouth as needed for pain (3-4 per week).       Marland Kitchen levothyroxine (SYNTHROID, LEVOTHROID) 50 MCG tablet Take 50 mcg by mouth daily before breakfast.      . quinapril (ACCUPRIL) 20 MG tablet Take 20 mg by mouth at bedtime.       No current facility-administered medications for this visit.    PHYSICAL EXAMINATION: ECOG PERFORMANCE STATUS: 0 - Asymptomatic  Filed Vitals:   03/13/14 1515  BP: 128/65  Pulse: 65  Temp: 98.4 F (36.9 C)  Resp: 20   Filed Weights   03/13/14 1515  Weight: 155 lb 8 oz (70.534 kg)    GENERAL:alert, no distress and comfortable SKIN: skin color, texture, turgor are normal, no rashes or significant lesions EYES: normal, Conjunctiva are pink and non-injected, sclera clear OROPHARYNX:no exudate, no erythema and lips, buccal mucosa, and tongue normal  NECK: supple, thyroid normal size, non-tender, without nodularity  LYMPH:  no palpable lymphadenopathy in the cervical, axillary or inguinal LUNGS: clear to auscultation and percussion with normal breathing effort HEART: regular rate & rhythm and no murmurs and no lower extremity edema ABDOMEN:abdomen soft, non-tender and normal bowel sounds Musculoskeletal:no cyanosis of digits and no clubbing  NEURO: alert & oriented x 3 with fluent speech, no focal motor/sensory deficits  LABORATORY DATA:  I have reviewed the data as listed     Component Value Date/Time   NA 135* 03/07/2014 1132   NA 129* 01/28/2013 1553   K 4.1 03/07/2014 1132   K 3.8 01/28/2013 1553   CL 94* 03/20/2013 1510   CL 93* 01/28/2013 1553   CO2 21* 03/07/2014 1132   CO2 29 01/28/2013 1553   GLUCOSE 139 03/07/2014 1132   GLUCOSE 122* 03/20/2013 1510   GLUCOSE 124* 01/28/2013 1553   BUN 15.7 03/07/2014 1132   BUN 17 01/28/2013 1553   CREATININE 1.3 03/07/2014 1132   CREATININE 1.1 01/28/2013 1553   CALCIUM 8.8 03/07/2014 1132   CALCIUM 8.7 01/28/2013 1553   PROT 7.1 03/07/2014 1132   PROT 6.8 01/28/2013 1553   ALBUMIN 3.7 03/07/2014 1132   ALBUMIN 3.4* 01/28/2013 1553   AST 33 03/07/2014 1132   AST 23 01/28/2013 1553   ALT 33 03/07/2014 1132   ALT 23 01/28/2013 1553   ALKPHOS 66 03/07/2014 1132   ALKPHOS 72 01/28/2013 1553   BILITOT 0.55 03/07/2014 1132   BILITOT 0.9 01/28/2013 1553    No results found for this basename: SPEP,  UPEP,   kappa and lambda light chains    Lab Results  Component Value Date   WBC 5.9 03/07/2014   NEUTROABS 3.1 03/07/2014   HGB 12.8* 03/07/2014   HCT 37.1* 03/07/2014   MCV 96.2 03/07/2014   PLT 265 03/07/2014      Chemistry      Component Value Date/Time   NA 135* 03/07/2014 1132   NA 129* 01/28/2013 1553   K 4.1 03/07/2014 1132   K 3.8 01/28/2013 1553   CL 94* 03/20/2013 1510   CL 93* 01/28/2013 1553   CO2 21* 03/07/2014 1132   CO2 29 01/28/2013 1553   BUN 15.7 03/07/2014 1132   BUN 17 01/28/2013 1553   CREATININE 1.3 03/07/2014 1132   CREATININE 1.1 01/28/2013 1553      Component Value Date/Time   CALCIUM 8.8 03/07/2014 1132   CALCIUM 8.7 01/28/2013 1553   ALKPHOS 66 03/07/2014 1132   ALKPHOS 72 01/28/2013 1553   AST 33 03/07/2014 1132   AST 23 01/28/2013 1553   ALT 33 03/07/2014 1132   ALT 23 01/28/2013 1553   BILITOT 0.55 03/07/2014 1132   BILITOT 0.9 01/28/2013 1553       RADIOGRAPHIC STUDIES: I have personally reviewed the radiological images as listed and agreed with the findings in the report. Ct Chest Wo Contrast  03/12/2014   CLINICAL DATA:   Follow-up of pulmonary nodule.  EXAM: CT CHEST WITHOUT CONTRAST  TECHNIQUE: Multidetector CT imaging of the chest was performed following the standard protocol without IV contrast.  COMPARISON:  01/31/2013 and 09/14/2012  FINDINGS: There has been no change in the small bilateral lower lobe pulmonary nodules since CT scan abdomen dated 09/14/2012. There are no new pulmonary nodules. Heart and mediastinal structures are stable. No infiltrates or effusions. No acute osseous abnormality. The visualized portion of the upper abdomen is normal.  IMPRESSION: Stable bilateral lower lobe pulmonary nodules. One additional follow-up CT scan without  contrast in 12 months is recommended.   Electronically Signed   By: Rozetta Nunnery M.D.   On: 03/12/2014 16:50     ASSESSMENT & PLAN:  Monoclonal gammopathy His blood work, 24-hour urine test and M spike is relatively stable. I recommend yearly examination and blood work. i plan to order skeletal survey every other year.. I would defer 24-hour urine collection to be done only if needed and there is clinical suspicion that he has disease progression. I gave him a letter to have this bloodwork done in his physicians office so that he does not need to have them repeated when he comes in.  Lung nodules Recent CT scan shows stability. The patient has no symptoms.  Anemia of other chronic disease This is likely anemia of chronic disease. The patient denies recent history of bleeding such as epistaxis, hematuria or hematochezia. He is asymptomatic from the anemia. We will observe for now.  He does not require further investigations.     All questions were answered. The patient knows to call the clinic with any problems, questions or concerns. No barriers to learning was detected. I spent 15 minutes counseling the patient face to face. The total time spent in the appointment was 20 minutes and more than 50% was on counseling and review of test results     [facility], [place],  MD 03/13/2014 9:01 PM

## 2014-03-13 NOTE — Assessment & Plan Note (Signed)
This is likely anemia of chronic disease. The patient denies recent history of bleeding such as epistaxis, hematuria or hematochezia. He is asymptomatic from the anemia. We will observe for now.  He does not require further investigations.

## 2014-03-13 NOTE — Assessment & Plan Note (Signed)
Recent CT scan shows stability. The patient has no symptoms.   

## 2014-03-13 NOTE — Assessment & Plan Note (Signed)
His blood work, 24-hour urine test and M spike is relatively stable. I recommend yearly examination and blood work. i plan to order skeletal survey every other year.. I would defer 24-hour urine collection to be done only if needed and there is clinical suspicion that he has disease progression. I gave him a letter to have this bloodwork done in his physicians office so that he does not need to have them repeated when he comes in.

## 2014-03-16 ENCOUNTER — Telehealth: Payer: Self-pay | Admitting: Hematology and Oncology

## 2014-03-16 NOTE — Telephone Encounter (Signed)
lmonvm for pt re appt for june 2016 and mailed schedule.

## 2014-06-20 ENCOUNTER — Encounter: Payer: Self-pay | Admitting: Gastroenterology

## 2015-02-13 ENCOUNTER — Other Ambulatory Visit: Payer: Medicare Other

## 2015-03-13 ENCOUNTER — Telehealth: Payer: Self-pay | Admitting: Hematology and Oncology

## 2015-03-13 ENCOUNTER — Other Ambulatory Visit: Payer: Self-pay | Admitting: Hematology and Oncology

## 2015-03-13 DIAGNOSIS — D472 Monoclonal gammopathy: Secondary | ICD-10-CM

## 2015-03-13 DIAGNOSIS — R918 Other nonspecific abnormal finding of lung field: Secondary | ICD-10-CM

## 2015-03-13 NOTE — Telephone Encounter (Signed)
lvm for pt regarding to JUNE appt.... °

## 2015-03-16 ENCOUNTER — Telehealth: Payer: Self-pay | Admitting: *Deleted

## 2015-03-16 ENCOUNTER — Ambulatory Visit: Payer: Medicare Other | Admitting: Hematology and Oncology

## 2015-03-16 NOTE — Telephone Encounter (Signed)
Pt states he recently had lab work done at PCP and thinks it is the same labs Dr. Alvy Bimler has ordered here.  He does not want to duplicate labwork.  Called Dr. Raul Del office and requested they fax over the lab results for Dr. Calton Dach review.  Informed pt we will call him back and let him know if we can cancel lab appt here on 6/17 or not.   Labs received and placed on Dr. Calton Dach desk for review.

## 2015-03-17 ENCOUNTER — Telehealth: Payer: Self-pay | Admitting: *Deleted

## 2015-03-17 NOTE — Telephone Encounter (Signed)
Informed pt no need for additional lab work per Dr. Alvy Bimler.  Keep xray/ CT as scheduled and MD visit.  Pt verbalized understanding.

## 2015-03-20 ENCOUNTER — Encounter (HOSPITAL_COMMUNITY): Payer: Self-pay

## 2015-03-20 ENCOUNTER — Ambulatory Visit (HOSPITAL_COMMUNITY)
Admission: RE | Admit: 2015-03-20 | Discharge: 2015-03-20 | Disposition: A | Discharge status: Home / Self Care | Payer: Medicare Other | Source: Ambulatory Visit | Attending: Hematology and Oncology | Admitting: Hematology and Oncology

## 2015-03-20 ENCOUNTER — Other Ambulatory Visit: Payer: Medicare Other

## 2015-03-20 ENCOUNTER — Ambulatory Visit (HOSPITAL_COMMUNITY): Payer: Medicare Other

## 2015-03-20 DIAGNOSIS — I2510 Atherosclerotic heart disease of native coronary artery without angina pectoris: Secondary | ICD-10-CM | POA: Diagnosis not present

## 2015-03-20 DIAGNOSIS — D472 Monoclonal gammopathy: Secondary | ICD-10-CM | POA: Insufficient documentation

## 2015-03-20 DIAGNOSIS — I10 Essential (primary) hypertension: Secondary | ICD-10-CM | POA: Diagnosis not present

## 2015-03-20 DIAGNOSIS — Z951 Presence of aortocoronary bypass graft: Secondary | ICD-10-CM | POA: Insufficient documentation

## 2015-03-20 DIAGNOSIS — R918 Other nonspecific abnormal finding of lung field: Secondary | ICD-10-CM | POA: Diagnosis present

## 2015-03-23 ENCOUNTER — Other Ambulatory Visit: Payer: Medicare Other

## 2015-03-23 ENCOUNTER — Ambulatory Visit: Payer: Medicare Other | Admitting: Hematology and Oncology

## 2015-03-30 ENCOUNTER — Encounter: Payer: Self-pay | Admitting: Hematology and Oncology

## 2015-03-30 ENCOUNTER — Telehealth: Payer: Self-pay | Admitting: Hematology and Oncology

## 2015-03-30 ENCOUNTER — Ambulatory Visit (HOSPITAL_BASED_OUTPATIENT_CLINIC_OR_DEPARTMENT_OTHER): Payer: Medicare Other | Admitting: Hematology and Oncology

## 2015-03-30 VITALS — BP 117/65 | HR 57 | Temp 98.2°F | Resp 18 | Ht 69.0 in | Wt 152.9 lb

## 2015-03-30 DIAGNOSIS — R918 Other nonspecific abnormal finding of lung field: Secondary | ICD-10-CM

## 2015-03-30 DIAGNOSIS — D472 Monoclonal gammopathy: Secondary | ICD-10-CM

## 2015-03-30 NOTE — Telephone Encounter (Signed)
pt already aware of appt

## 2015-03-30 NOTE — Assessment & Plan Note (Signed)
Recent CT scan shows stability. The patient has no symptoms.

## 2015-03-30 NOTE — Assessment & Plan Note (Signed)
His blood work and M spike is relatively stable. Clinically, he has no signs of disease progression. I recommend yearly examination and blood work. I plan to order skeletal survey every other year. I would defer 24-hour urine collection to be done only if needed and there is clinical suspicion that he has disease progression. I gave him a letter to have this bloodwork done in his physicians office so that he does not need to have them repeated when he comes in.

## 2015-03-30 NOTE — Progress Notes (Signed)
[place] OFFICE PROGRESS NOTE  Patient Care Team: Marton Redwood, MD as PCP - General (Internal Medicine) Inda Castle, MD as Attending Physician (Gastroenterology) Peter M Martinique, MD as Attending Physician (Cardiology) Jarome Matin, MD as Consulting Physician (Dermatology) Provider Not In System Heath Lark, MD as Consulting Physician (Hematology and Oncology)  SUMMARY OF ONCOLOGIC HISTORY: This is a pleasant gentleman who was found to have anemia and abnormal M spike. He had further workup including blood work, urine test, skeletal survey as well as bone marrow aspirate and biopsy. He was placed on observation. He also has abnormal lung nodule, observed with serial imaging CT scan.  INTERVAL HISTORY: Please see below for problem oriented charting. He feels well. He denies any recent infection. No bone pain. Denies recent cough.  REVIEW OF SYSTEMS:   Constitutional: Denies fevers, chills or abnormal weight loss Eyes: Denies blurriness of vision Ears, nose, mouth, throat, and face: Denies mucositis or sore throat Respiratory: Denies cough, dyspnea or wheezes Cardiovascular: Denies palpitation, chest discomfort or lower extremity swelling Gastrointestinal:  Denies nausea, heartburn or change in bowel habits Skin: Denies abnormal skin rashes Lymphatics: Denies new lymphadenopathy or easy bruising Neurological:Denies numbness, tingling or new weaknesses Behavioral/Psych: Mood is stable, no new changes  All other systems were reviewed with the patient and are negative.  I have reviewed the past medical history, past surgical history, social history and family history with the patient and they are unchanged from previous note.  ALLERGIES:  is allergic to cortisone.  MEDICATIONS:  Current Outpatient Prescriptions  Medication Sig Dispense Refill  . amLODipine (NORVASC) 2.5 MG tablet Take 2.5 mg by mouth daily.    . Ascorbic Acid (VITAMIN C) 100 MG tablet Take 100 mg  by mouth daily.    Marland Kitchen aspirin 81 MG tablet Take 81 mg by mouth daily.    Marland Kitchen atenolol (TENORMIN) 25 MG tablet Take 25 mg by mouth daily.    Marland Kitchen atorvastatin (LIPITOR) 20 MG tablet Take 10 mg by mouth daily.     . B Complex Vitamins (VITAMIN B COMPLEX PO) Take 1 tablet by mouth daily.    . Cholecalciferol (VITAMIN D PO) Take 1,000 Units by mouth daily.     Marland Kitchen ezetimibe (ZETIA) 10 MG tablet Take 10 mg by mouth daily.      Marland Kitchen levothyroxine (SYNTHROID, LEVOTHROID) 50 MCG tablet Take 50 mcg by mouth daily before breakfast.    . quinapril (ACCUPRIL) 20 MG tablet Take 20 mg by mouth at bedtime.    . gabapentin (NEURONTIN) 300 MG capsule Take 300 mg by mouth daily as needed.    Marland Kitchen HYDROcodone-acetaminophen (VICODIN) 5-500 MG per tablet Take 0.5 tablets by mouth as needed for pain (3-4 per week).      No current facility-administered medications for this visit.    PHYSICAL EXAMINATION: ECOG PERFORMANCE STATUS: 0 - Asymptomatic  Filed Vitals:   03/30/15 1119  BP: 117/65  Pulse: 57  Temp: 98.2 F (36.8 C)  Resp: 18   Filed Weights   03/30/15 1119  Weight: 152 lb 14.4 oz (69.355 kg)    GENERAL:alert, no distress and comfortable SKIN: skin color, texture, turgor are normal, no rashes or significant lesions EYES: normal, Conjunctiva are pink and non-injected, sclera clear OROPHARYNX:no exudate, no erythema and lips, buccal mucosa, and tongue normal  NECK: supple, thyroid normal size, non-tender, without nodularity LYMPH:  no palpable lymphadenopathy in the cervical, axillary or inguinal LUNGS: clear to auscultation and percussion with normal breathing  effort HEART: regular rate & rhythm and no murmurs and no lower extremity edema ABDOMEN:abdomen soft, non-tender and normal bowel sounds Musculoskeletal:no cyanosis of digits and no clubbing  NEURO: alert & oriented x 3 with fluent speech, no focal motor/sensory deficits  LABORATORY DATA:  I have reviewed the data as listed    Component Value  Date/Time   NA 135* 03/07/2014 1132   NA 129* 01/28/2013 1553   K 4.1 03/07/2014 1132   K 3.8 01/28/2013 1553   CL 94* 03/20/2013 1510   CL 93* 01/28/2013 1553   CO2 21* 03/07/2014 1132   CO2 29 01/28/2013 1553   GLUCOSE 139 03/07/2014 1132   GLUCOSE 122* 03/20/2013 1510   GLUCOSE 124* 01/28/2013 1553   BUN 15.7 03/07/2014 1132   BUN 17 01/28/2013 1553   CREATININE 1.3 03/07/2014 1132   CREATININE 1.1 01/28/2013 1553   CALCIUM 8.8 03/07/2014 1132   CALCIUM 8.7 01/28/2013 1553   PROT 7.1 03/07/2014 1132   PROT 6.8 01/28/2013 1553   ALBUMIN 3.7 03/07/2014 1132   ALBUMIN 3.4* 01/28/2013 1553   AST 33 03/07/2014 1132   AST 23 01/28/2013 1553   ALT 33 03/07/2014 1132   ALT 23 01/28/2013 1553   ALKPHOS 66 03/07/2014 1132   ALKPHOS 72 01/28/2013 1553   BILITOT 0.55 03/07/2014 1132   BILITOT 0.9 01/28/2013 1553    No results found for: SPEP, UPEP  Lab Results  Component Value Date   WBC 5.9 03/07/2014   NEUTROABS 3.1 03/07/2014   HGB 12.8* 03/07/2014   HCT 37.1* 03/07/2014   MCV 96.2 03/07/2014   PLT 265 03/07/2014      Chemistry      Component Value Date/Time   NA 135* 03/07/2014 1132   NA 129* 01/28/2013 1553   K 4.1 03/07/2014 1132   K 3.8 01/28/2013 1553   CL 94* 03/20/2013 1510   CL 93* 01/28/2013 1553   CO2 21* 03/07/2014 1132   CO2 29 01/28/2013 1553   BUN 15.7 03/07/2014 1132   BUN 17 01/28/2013 1553   CREATININE 1.3 03/07/2014 1132   CREATININE 1.1 01/28/2013 1553      Component Value Date/Time   CALCIUM 8.8 03/07/2014 1132   CALCIUM 8.7 01/28/2013 1553   ALKPHOS 66 03/07/2014 1132   ALKPHOS 72 01/28/2013 1553   AST 33 03/07/2014 1132   AST 23 01/28/2013 1553   ALT 33 03/07/2014 1132   ALT 23 01/28/2013 1553   BILITOT 0.55 03/07/2014 1132   BILITOT 0.9 01/28/2013 1553       RADIOGRAPHIC STUDIES: I reviewed the CT scan and skeletal survey with him and his wife I have personally reviewed the radiological images as listed and agreed with the  findings in the report.   ASSESSMENT & PLAN:  Monoclonal gammopathy His blood work and M spike is relatively stable. Clinically, he has no signs of disease progression. I recommend yearly examination and blood work. I plan to order skeletal survey every other year. I would defer 24-hour urine collection to be done only if needed and there is clinical suspicion that he has disease progression. I gave him a letter to have this bloodwork done in his physicians office so that he does not need to have them repeated when he comes in.  Lung nodules Recent CT scan shows stability. The patient has no symptoms.     No orders of the defined types were placed in this encounter.   All questions were answered. The patient  knows to call the clinic with any problems, questions or concerns. No barriers to learning was detected. I spent 15 minutes counseling the patient face to face. The total time spent in the appointment was 20 minutes and more than 50% was on counseling and review of test results     [facility], [place], MD 03/30/2015 2:45 PM

## 2016-03-29 ENCOUNTER — Other Ambulatory Visit: Payer: Self-pay | Admitting: Hematology and Oncology

## 2016-03-29 ENCOUNTER — Ambulatory Visit (HOSPITAL_BASED_OUTPATIENT_CLINIC_OR_DEPARTMENT_OTHER): Payer: Medicare Other | Admitting: Hematology and Oncology

## 2016-03-29 ENCOUNTER — Encounter: Payer: Self-pay | Admitting: Hematology and Oncology

## 2016-03-29 VITALS — BP 122/81 | HR 88 | Temp 97.7°F | Resp 17 | Ht 69.0 in | Wt 156.2 lb

## 2016-03-29 DIAGNOSIS — D472 Monoclonal gammopathy: Secondary | ICD-10-CM | POA: Diagnosis not present

## 2016-03-29 DIAGNOSIS — D630 Anemia in neoplastic disease: Secondary | ICD-10-CM | POA: Diagnosis not present

## 2016-03-30 NOTE — Assessment & Plan Note (Signed)
This is likely anemia of chronic disease. The patient denies recent history of bleeding such as epistaxis, hematuria or hematochezia. He is asymptomatic from the anemia. We will observe for now.  

## 2016-03-30 NOTE — Progress Notes (Signed)
[place] OFFICE PROGRESS NOTE  Patient Care Team: Marton Redwood, MD as PCP - General (Internal Medicine) Inda Castle, MD as Attending Physician (Gastroenterology) Peter M Martinique, MD as Attending Physician (Cardiology) Jarome Matin, MD as Consulting Physician (Dermatology) Provider Not In System Heath Lark, MD as Consulting Physician (Hematology and Oncology)  SUMMARY OF ONCOLOGIC HISTORY:  This is a pleasant gentleman who was found to have anemia and abnormal M spike. He had further workup including blood work, urine test, skeletal survey as well as bone marrow aspirate and biopsy. He was placed on observation. He also has abnormal lung nodule, observed with serial imaging CT scan. Last CT scan of the chest dated June 2016, confirmed benign lung nodules with no further follow-up recommended  INTERVAL HISTORY: Please see below for problem oriented charting. He feels well. Denies recent infection. No new bone pain.  REVIEW OF SYSTEMS:   Constitutional: Denies fevers, chills or abnormal weight loss Eyes: Denies blurriness of vision Ears, nose, mouth, throat, and face: Denies mucositis or sore throat Respiratory: Denies cough, dyspnea or wheezes Cardiovascular: Denies palpitation, chest discomfort or lower extremity swelling Gastrointestinal:  Denies nausea, heartburn or change in bowel habits Skin: Denies abnormal skin rashes Lymphatics: Denies new lymphadenopathy or easy bruising Neurological:Denies numbness, tingling or new weaknesses Behavioral/Psych: Mood is stable, no new changes  All other systems were reviewed with the patient and are negative.  I have reviewed the past medical history, past surgical history, social history and family history with the patient and they are unchanged from previous note.  ALLERGIES:  is allergic to cortisone.  MEDICATIONS:  Current Outpatient Prescriptions  Medication Sig Dispense Refill  . amLODipine (NORVASC) 2.5 MG  tablet Take 2.5 mg by mouth daily.    . Ascorbic Acid (VITAMIN C) 100 MG tablet Take 100 mg by mouth daily.    Marland Kitchen aspirin 81 MG tablet Take 81 mg by mouth daily.    Marland Kitchen atenolol (TENORMIN) 25 MG tablet Take 12.5 mg by mouth daily.     Marland Kitchen atorvastatin (LIPITOR) 20 MG tablet Take 10 mg by mouth daily.     . B Complex Vitamins (VITAMIN B COMPLEX PO) Take 1 tablet by mouth daily.    . Cholecalciferol (VITAMIN D PO) Take 1,000 Units by mouth daily.     Marland Kitchen ezetimibe (ZETIA) 10 MG tablet Take 10 mg by mouth daily.      Marland Kitchen gabapentin (NEURONTIN) 300 MG capsule Take 300 mg by mouth daily as needed.    Marland Kitchen HYDROcodone-acetaminophen (VICODIN) 5-500 MG per tablet Take 0.5 tablets by mouth as needed for pain (3-4 per week).     Marland Kitchen levothyroxine (SYNTHROID, LEVOTHROID) 50 MCG tablet Take 50 mcg by mouth daily before breakfast.    . quinapril (ACCUPRIL) 20 MG tablet Take 20 mg by mouth at bedtime.     No current facility-administered medications for this visit.    PHYSICAL EXAMINATION: ECOG PERFORMANCE STATUS: 0 - Asymptomatic  Filed Vitals:   03/29/16 1351  BP: 122/81  Pulse: 88  Temp: 97.7 F (36.5 C)  Resp: 17   Filed Weights   03/29/16 1351  Weight: 156 lb 3.2 oz (70.852 kg)    GENERAL:alert, no distress and comfortable SKIN: skin color, texture, turgor are normal, no rashes or significant lesions EYES: normal, Conjunctiva are pink and non-injected, sclera clear OROPHARYNX:no exudate, no erythema and lips, buccal mucosa, and tongue normal  NECK: supple, thyroid normal size, non-tender, without nodularity LYMPH:  no palpable  lymphadenopathy in the cervical, axillary or inguinal LUNGS: clear to auscultation and percussion with normal breathing effort HEART: regular rate & rhythm and no murmurs and no lower extremity edema ABDOMEN:abdomen soft, non-tender and normal bowel sounds Musculoskeletal:no cyanosis of digits and no clubbing  NEURO: alert & oriented x 3 with fluent speech, no focal  motor/sensory deficits  LABORATORY DATA:  I have reviewed the data as listed    Component Value Date/Time   NA 135* 03/07/2014 1132   NA 129* 01/28/2013 1553   K 4.1 03/07/2014 1132   K 3.8 01/28/2013 1553   CL 94* 03/20/2013 1510   CL 93* 01/28/2013 1553   CO2 21* 03/07/2014 1132   CO2 29 01/28/2013 1553   GLUCOSE 139 03/07/2014 1132   GLUCOSE 122* 03/20/2013 1510   GLUCOSE 124* 01/28/2013 1553   BUN 15.7 03/07/2014 1132   BUN 17 01/28/2013 1553   CREATININE 1.3 03/07/2014 1132   CREATININE 1.1 01/28/2013 1553   CALCIUM 8.8 03/07/2014 1132   CALCIUM 8.7 01/28/2013 1553   PROT 7.1 03/07/2014 1132   PROT 6.8 01/28/2013 1553   ALBUMIN 3.7 03/07/2014 1132   ALBUMIN 3.4* 01/28/2013 1553   AST 33 03/07/2014 1132   AST 23 01/28/2013 1553   ALT 33 03/07/2014 1132   ALT 23 01/28/2013 1553   ALKPHOS 66 03/07/2014 1132   ALKPHOS 72 01/28/2013 1553   BILITOT 0.55 03/07/2014 1132   BILITOT 0.9 01/28/2013 1553    No results found for: SPEP, UPEP  Lab Results  Component Value Date   WBC 5.9 03/07/2014   NEUTROABS 3.1 03/07/2014   HGB 12.8* 03/07/2014   HCT 37.1* 03/07/2014   MCV 96.2 03/07/2014   PLT 265 03/07/2014      Chemistry      Component Value Date/Time   NA 135* 03/07/2014 1132   NA 129* 01/28/2013 1553   K 4.1 03/07/2014 1132   K 3.8 01/28/2013 1553   CL 94* 03/20/2013 1510   CL 93* 01/28/2013 1553   CO2 21* 03/07/2014 1132   CO2 29 01/28/2013 1553   BUN 15.7 03/07/2014 1132   BUN 17 01/28/2013 1553   CREATININE 1.3 03/07/2014 1132   CREATININE 1.1 01/28/2013 1553      Component Value Date/Time   CALCIUM 8.8 03/07/2014 1132   CALCIUM 8.7 01/28/2013 1553   ALKPHOS 66 03/07/2014 1132   ALKPHOS 72 01/28/2013 1553   AST 33 03/07/2014 1132   AST 23 01/28/2013 1553   ALT 33 03/07/2014 1132   ALT 23 01/28/2013 1553   BILITOT 0.55 03/07/2014 1132   BILITOT 0.9 01/28/2013 1553    I reviewed outside records. Blood work dated 03/21/2016 so she serum  creatinine of 1.1, calcium of 9.4, hemoglobin of 11.7, serum IgG level of 998, M spike of 0.5 g and free kappa light chains at 32.5 mg/L.  ASSESSMENT & PLAN:  Monoclonal gammopathy His blood work and M spike is relatively stable. Clinically, he has no signs of disease progression. There were evidence of chronic anemia but that appears to be stable I recommend yearly examination and blood work.  I would defer 24-hour urine collection to be done only if needed and there is clinical suspicion that he has disease progression. I gave him a letter to have this bloodwork done in his physicians office so that he does not need to have them repeated when he comes in.  Anemia in neoplastic disease This is likely anemia of chronic disease. The patient  denies recent history of bleeding such as epistaxis, hematuria or hematochezia. He is asymptomatic from the anemia. We will observe for now.     No orders of the defined types were placed in this encounter.   All questions were answered. The patient knows to call the clinic with any problems, questions or concerns. No barriers to learning was detected. I spent 15 minutes counseling the patient face to face. The total time spent in the appointment was 20 minutes and more than 50% was on counseling and review of test results     [facility], Olegario Emberson, MD 03/30/2016 8:03 AM

## 2016-03-30 NOTE — Assessment & Plan Note (Signed)
His blood work and M spike is relatively stable. Clinically, he has no signs of disease progression. There were evidence of chronic anemia but that appears to be stable I recommend yearly examination and blood work.  I would defer 24-hour urine collection to be done only if needed and there is clinical suspicion that he has disease progression. I gave him a letter to have this bloodwork done in his physicians office so that he does not need to have them repeated when he comes in.

## 2016-10-26 ENCOUNTER — Encounter (INDEPENDENT_AMBULATORY_CARE_PROVIDER_SITE_OTHER): Payer: Self-pay | Admitting: Orthopaedic Surgery

## 2016-10-26 ENCOUNTER — Ambulatory Visit (INDEPENDENT_AMBULATORY_CARE_PROVIDER_SITE_OTHER): Payer: Medicare Other | Admitting: Physician Assistant

## 2016-10-26 DIAGNOSIS — M7542 Impingement syndrome of left shoulder: Secondary | ICD-10-CM

## 2016-10-26 NOTE — Progress Notes (Signed)
Office Visit Note   Patient: Mitchell Fields           Date of Birth: April 12, 1936           MRN: [id number] Visit Date: 10/26/2016              Requested by: Marton Redwood, MD [address] [place], [place] [address] PCP: Marton Redwood, MD   Assessment & Plan: Visit Diagnoses:  1. Impingement syndrome of left shoulder     Plan: Pendulum and Codman him and wall crawls exercises shown. He'll follow up with [place] on a when necessary basis pain persist or becomes worse.  Follow-Up Instructions: Return if symptoms worsen or fail to improve.   Orders:  No orders of the defined types were placed in this encounter.  No orders of the defined types were placed in this encounter.     Procedures: No procedures performed   Clinical Data: No additional findings.   Subjective: Chief Complaint  Patient presents with  . Left Shoulder - Pain    -States hurts to raise his left arm above his head. -Would like a Cort. Inj.?    HPI Mitchell Fields is well-known Dr. Trevor Mace service last March had a cortisone injection in the left shoulder did well until about 3-4 months ago. Having pain in the shoulder with extremes of external rotation and also whenever he is trying to do pushups his back. Had no new injury to the shoulder. Denies any numbness tingling down either arm. Tried no treatments for the shoulder. Requesting cortisone injection in left shoulder Review of Systems See HPI Objective: Vital Signs: There were no vitals taken for this visit.  Physical Exam  Constitutional: He is oriented to person, place, and time. He appears well-developed and well-nourished. No distress.  Pulmonary/Chest: Effort normal.  Neurological: He is alert and oriented to person, place, and time.    Ortho Exam Shoulder lacks the last few degrees of forward flexion actively. Passively I can bring him to 180 he has some discomfort with this. 5 out of 5 strengths with external/internal rotation against  resistance bilateral shoulders. Empty can test negative on the right slightly weak on the left. Positive impingement testing on the left. Specialty Comments:  No specialty comments available.  Imaging: No results found.   PMFS History: Patient Active Problem List   Diagnosis Date Noted  . Anemia in neoplastic disease 03/13/2014  . Monoclonal gammopathy   . Lung nodules 03/11/2013  . Abnormal weight loss 01/28/2013  . CAD (coronary artery disease) 08/03/2011  . HTN (hypertension) 08/03/2011  . Hyperlipidemia 08/03/2011  . Valvular heart disease 08/03/2011   Past Medical History:  Diagnosis Date  . Anemia   . Arthritis   . Back pain   . Bursitis of left hip   . Coronary artery disease    TWO VESSEL BYPASS SURGERY in January 1995  . Diverticulosis   . Hyperlipidemia   . Hypertension   . Monoclonal gammopathy   . Neuropathy ([place])     Family History  Problem Relation Age of Onset  . Lung cancer Father 32    smoker  . Diabetes Father   . Cancer Sister     breast in one; "bone" cancer in other    Past Surgical History:  Procedure Laterality Date  . ADENOIDECTOMY    . [place], 2002   X3  . CORONARY ARTERY BYPASS GRAFT     X2. LIMA GRAFT AND A RIMA GRAFT.  Marland Kitchen  INGUINAL HERNIA REPAIR Left   . Negative stress echo  08/11/2009  . US ECHOCARDIOGRAPHY  June 2012   Normal EF, mild aortic regurgitation, mild MR, LAE, mildly dilated RV, RAE, increased left atrial pressure, moderate to severe TR and mildly increased  PA systolic pressure.    Social History   Occupational History  . retired Retired    Social research officer, government.  [employer]   Social History Main Topics  . Smoking status: Former Smoker    Packs/day: 2.00    Years: 5.00    Quit date: 10/04/1967  . Smokeless tobacco: Never Used  . Alcohol use 4.2 oz/week    7 Shots of liquor per week  . Drug use: No  . Sexual activity: Not on file

## 2016-11-20 NOTE — Progress Notes (Signed)
Cardiology Office Note    Date:  11/22/2016   ID:  Mitchell Fields, DOB Dec 31, 1935, MRN [id number]  PCP:  Mitchell Redwood, MD  Cardiologist:  Mitchell Kuwahara Martinique, MD    History of Present Illness:  Mitchell Fields is a 81 y.o. male seen to reestablish care. He has a known history of remote MI, remote CABG 1995 in [place], also has HTN and hyperlipidemia. He had an echo in June 2012 showing normal EF, mild aortic regurgitation, mild MR, mild to moderate LAE, mildly dilated RV, moderate RAE, moderate to severe TR and redundant atrial septum consistent with increased left atrial pressure. Echo in 2014 showed mild LV dysfunction with EF 45-50%. Mild AI and MR. CABG in 1995 included  LIMA and RIMA grafts. He has been diagnosed with monoclonal gammopathy and anemia of chronic disease.   On follow up today he reports he is doing very well from a cardiac standpoint. He denies any chest pain, SOB, palpitations, or edema. His legs do tire easily when going up stairs. He attributes this to his neuropathy. He is fairly active and walks with his wife. Reports good control of BP.    Past Medical History:  Diagnosis Date  . Anemia   . Arthritis   . Back pain   . Bursitis of left hip   . Coronary artery disease    TWO VESSEL BYPASS SURGERY in January 1995  . Diverticulosis   . Hyperlipidemia   . Hypertension   . Monoclonal gammopathy   . Neuropathy [facility])     Past Surgical History:  Procedure Laterality Date  . ADENOIDECTOMY    . [place], 2002   X3  . CORONARY ARTERY BYPASS GRAFT     X2. LIMA GRAFT AND A RIMA GRAFT.  Marland Kitchen INGUINAL HERNIA REPAIR Left   . Negative stress echo  08/11/2009  . US ECHOCARDIOGRAPHY  June 2012   Normal EF, mild aortic regurgitation, mild MR, LAE, mildly dilated RV, RAE, increased left atrial pressure, moderate to severe TR and mildly increased  PA systolic pressure.     Current Medications: Outpatient Medications Prior to Visit  Medication Sig Dispense  Refill  . amLODipine (NORVASC) 2.5 MG tablet Take 2.5 mg by mouth daily.    . Ascorbic Acid (VITAMIN C) 100 MG tablet Take 100 mg by mouth 2 (two) times daily.     Marland Kitchen aspirin 81 MG tablet Take 81 mg by mouth daily.    Marland Kitchen atenolol (TENORMIN) 25 MG tablet Take 25 mg by mouth daily.     Marland Kitchen atorvastatin (LIPITOR) 20 MG tablet Take 10 mg by mouth daily.     . B Complex Vitamins (VITAMIN B COMPLEX PO) Take 1 tablet by mouth daily.    . Cholecalciferol (VITAMIN D PO) Take 1,000 Units by mouth daily.     Marland Kitchen ezetimibe (ZETIA) 10 MG tablet Take 10 mg by mouth daily.      Marland Kitchen gabapentin (NEURONTIN) 300 MG capsule Take 300 mg by mouth daily as needed.    Marland Kitchen HYDROcodone-acetaminophen (VICODIN) 5-500 MG per tablet Take 0.5 tablets by mouth as needed for pain (3-4 per week).     Marland Kitchen levothyroxine (SYNTHROID, LEVOTHROID) 50 MCG tablet Take 50 mcg by mouth daily before breakfast.    . quinapril (ACCUPRIL) 20 MG tablet Take 20 mg by mouth every morning.      No facility-administered medications prior to visit.      Allergies:   Cortisone   Social  History   Social History  . Marital status: Married    Spouse name: N/A  . Number of children: 2  . Years of education: N/A   Occupational History  . retired Retired    Social research officer, government.  [employer]   Social History Main Topics  . Smoking status: Former Smoker    Packs/day: 2.00    Years: 5.00    Quit date: 10/04/1967  . Smokeless tobacco: Never Used  . Alcohol use 4.2 oz/week    7 Shots of liquor per week  . Drug use: No  . Sexual activity: Not Asked   Other Topics Concern  . None   Social History Narrative  . None     Family History:  The patient's family history includes Cancer in his sister; Diabetes in his father; Lung cancer (age of onset: 51) in his father.   ROS:   Please see the history of present illness.    ROS All other systems reviewed and are negative.   PHYSICAL EXAM:   VS:  BP 120/80 (BP Location: Left Arm, Patient Position:  Sitting, Cuff Size: Normal)   Pulse 68    GEN: Well nourished, well developed, in no acute distress  HEENT: normal  Neck: no JVD, carotid bruits, or masses Cardiac: RRR; no murmurs, rubs, or gallops,no edema. Old median sternotomy scar.  Respiratory:  clear to auscultation bilaterally, normal work of breathing GI: soft, nontender, nondistended, + BS MS: no deformity or atrophy  Skin: warm and dry, no rash Neuro:  Alert and Oriented x 3, Strength and sensation are intact Psych: euthymic mood, full affect  Wt Readings from Last 3 Encounters:  03/29/16 156 lb 3.2 oz (70.9 kg)  03/30/15 152 lb 14.4 oz (69.4 kg)  03/13/14 155 lb 8 oz (70.5 kg)      Studies/Labs Reviewed:   EKG:  EKG is ordered today.  The ekg ordered today demonstrates NSR with rate 62. LAFB. Otherwise normal. I have personally reviewed and interpreted this study.   Labs dated 03/09/16: cholesterol 129, triglycerides 148, HDL 50, LDL 49.  Dated 10/04/16: creatinine 1.5. Other chemistries normal.   Recent Labs: No results found for requested labs within last 8760 hours.   Lipid Panel No results found for: CHOL, TRIG, HDL, CHOLHDL, VLDL, LDLCALC, LDLDIRECT  Additional studies/ records that were reviewed today include:  Echo 03/26/13:- Left ventricle: The cavity size was normal. Wall thickness was normal. Systolic function was mildly reduced. The estimated ejection fraction was in the range of 45% to 50%. There is hypokinesis of the basal-mid inferior posterior myocardium. Doppler parameters are consistent with abnormal left ventricular relaxation (grade 1 diastolic dysfunction). - Aortic valve: Mild regurgitation. - Mitral valve: Mild regurgitation. - Pulmonary arteries: PA peak pressure: 77mm Hg (S).  ASSESSMENT:    1. Coronary artery disease involving coronary bypass graft of native heart without angina pectoris   2. Essential hypertension   3. Pure hypercholesterolemia      PLAN:  In order of  problems listed above:  1. Patient continues to do very well now 23 years post CABG with RIMA and LIMA grafts. He is asymptomatic. Recommend continued medical therapy with risk factor modification. Would not recommend stress testing unless he develops symptoms 2. BP is under excellent control. Continue Norvasc, atenolol, accupril. 3. On statin therapy with good control    Medication Adjustments/Labs and Tests Ordered: Current medicines are reviewed at length with the patient today.  Concerns regarding medicines are outlined above.  Medication  changes, Labs and Tests ordered today are listed in the Patient Instructions below. Patient Instructions  Continue your current therapy  I will see you in one year      Signed, Olivette Beckmann Martinique, MD  11/22/2016 5:20 PM    [facility] [address] [place], [place], [address] [phone]

## 2016-11-22 ENCOUNTER — Encounter: Payer: Self-pay | Admitting: Cardiology

## 2016-11-22 ENCOUNTER — Ambulatory Visit (INDEPENDENT_AMBULATORY_CARE_PROVIDER_SITE_OTHER): Payer: Medicare Other | Admitting: Cardiology

## 2016-11-22 VITALS — BP 120/80 | HR 68

## 2016-11-22 DIAGNOSIS — E7800 Pure hypercholesterolemia, unspecified: Secondary | ICD-10-CM | POA: Diagnosis not present

## 2016-11-22 DIAGNOSIS — I25810 Atherosclerosis of coronary artery bypass graft(s) without angina pectoris: Secondary | ICD-10-CM | POA: Diagnosis not present

## 2016-11-22 DIAGNOSIS — I10 Essential (primary) hypertension: Secondary | ICD-10-CM | POA: Diagnosis not present

## 2016-11-22 NOTE — Patient Instructions (Signed)
Continue your current therapy  I will see you in one year   

## 2016-12-07 ENCOUNTER — Ambulatory Visit: Payer: Medicare Other | Admitting: Cardiology

## 2016-12-23 ENCOUNTER — Encounter: Payer: Self-pay | Admitting: Hematology and Oncology

## 2016-12-26 ENCOUNTER — Telehealth: Payer: Self-pay | Admitting: Hematology and Oncology

## 2016-12-26 ENCOUNTER — Other Ambulatory Visit: Payer: Self-pay | Admitting: Hematology and Oncology

## 2016-12-26 DIAGNOSIS — D472 Monoclonal gammopathy: Secondary | ICD-10-CM

## 2016-12-26 DIAGNOSIS — D630 Anemia in neoplastic disease: Secondary | ICD-10-CM

## 2016-12-26 NOTE — Telephone Encounter (Signed)
Scheduled appt per schedule message from MD . Patient is aware of new appt date and time.

## 2017-03-02 ENCOUNTER — Ambulatory Visit (INDEPENDENT_AMBULATORY_CARE_PROVIDER_SITE_OTHER): Payer: Medicare Other | Admitting: Orthopaedic Surgery

## 2017-03-02 ENCOUNTER — Ambulatory Visit (INDEPENDENT_AMBULATORY_CARE_PROVIDER_SITE_OTHER): Payer: Medicare Other

## 2017-03-02 DIAGNOSIS — M25511 Pain in right shoulder: Secondary | ICD-10-CM | POA: Diagnosis not present

## 2017-03-02 DIAGNOSIS — M7541 Impingement syndrome of right shoulder: Secondary | ICD-10-CM

## 2017-03-02 MED ORDER — LIDOCAINE HCL 1 % IJ SOLN
3.0000 mL | INTRAMUSCULAR | Status: AC | PRN
  Administered 2017-03-02: 3 mL

## 2017-03-02 MED ORDER — METHYLPREDNISOLONE ACETATE 40 MG/ML IJ SUSP
40.0000 mg | INTRAMUSCULAR | Status: AC | PRN
  Administered 2017-03-02: 40 mg via INTRA_ARTICULAR

## 2017-03-02 NOTE — Progress Notes (Signed)
Office Visit Note   Patient: Mitchell Fields           Date of Birth: 26-Jul-1936           MRN: [id number] Visit Date: 03/02/2017              Requested by: Marton Redwood, MD [address] [place], [place] [address] PCP: Marton Redwood, MD   Assessment & Plan: Visit Diagnoses:  1. Acute pain of right shoulder   2. Impingement syndrome of right shoulder     Plan: We talked about a cortisone injection in his right subacromial space. He's done well with this with his left shoulder before. He was hoping to have an injection today and he understands risks and benefits of this. He tolerated the injection well. He'll still work on shoulder mobility. He'll follow up as needed. All questions were encouraged and answered.  Follow-Up Instructions: Return if symptoms worsen or fail to improve.   Orders:  Orders Placed This Encounter  Procedures  . Large Joint Injection/Arthrocentesis  . XR Shoulder Right   No orders of the defined types were placed in this encounter.     Procedures: Large Joint Inj Date/Time: 03/02/2017 3:55 PM Performed by: Mcarthur Rossetti Authorized by: Mcarthur Rossetti   Location:  Shoulder Site:  R subacromial bursa Ultrasound Guidance: No   Fluoroscopic Guidance: No   Arthrogram: No   Medications:  3 mL lidocaine 1 %; 40 mg methylPREDNISolone acetate 40 MG/ML     Clinical Data: No additional findings.   Subjective: No chief complaint on file. The patient comes in today with chief complaint right shoulder pain. Is been hurting for about 2 months now with no significant injury. We have seen him for his left shoulder in the past and he says that this is similar to what he was done with of the left shoulder. He says range of motion is very painful and to left his arm above his head and behind him hurts. He denies any injury recently to that shoulder or any increase in activities that may have led to his right shoulder pain. It isn't aggravating  type of pain and he does wish to try a steroid injection in the right shoulder today if we feel that is warranted.  HPI  Review of Systems He denies any headache, chest pain, short of breath, fever, chills, nausea and vomiting.  Objective: Vital Signs: There were no vitals taken for this visit.  Physical Exam He is alert and oriented 3 and in no acute distress Ortho Exam Examination of his right shoulder shows fluid range of motion that is full in all planes including abduction, forward flexion, extension, external rotation, and internal rotation. His rotator cuff shows good integrity. He has mildly positive Neer and Hawkins signs. His neck exam is normal in his right upper extremity exam from the elbow to the hand is normal. Specialty Comments:  No specialty comments available.  Imaging: Xr Shoulder Right  Result Date: 03/02/2017 3 views including AP, outlet and axillary views of the right shoulder show well located shoulder with no acute findings. The glenohumeral joint and acromioclavicular joints are still well maintained.    PMFS History: Patient Active Problem List   Diagnosis Date Noted  . Acute pain of right shoulder 03/02/2017  . Impingement syndrome of right shoulder 03/02/2017  . Anemia in neoplastic disease 03/13/2014  . Monoclonal gammopathy   . Lung nodules 03/11/2013  . Abnormal weight loss 01/28/2013  .  CAD (coronary artery disease) 08/03/2011  . HTN (hypertension) 08/03/2011  . Hyperlipidemia 08/03/2011  . Valvular heart disease 08/03/2011   Past Medical History:  Diagnosis Date  . Anemia   . Arthritis   . Back pain   . Bursitis of left hip   . Coronary artery disease    TWO VESSEL BYPASS SURGERY in January 1995  . Diverticulosis   . Hyperlipidemia   . Hypertension   . Monoclonal gammopathy   . Neuropathy ([place])     Family History  Problem Relation Age of Onset  . Lung cancer Father 69       smoker  . Diabetes Father   . Cancer Sister         breast in one; "bone" cancer in other    Past Surgical History:  Procedure Laterality Date  . ADENOIDECTOMY    . [place], 2002   X3  . CORONARY ARTERY BYPASS GRAFT     X2. LIMA GRAFT AND A RIMA GRAFT.  Marland Kitchen INGUINAL HERNIA REPAIR Left   . Negative stress echo  08/11/2009  . US ECHOCARDIOGRAPHY  June 2012   Normal EF, mild aortic regurgitation, mild MR, LAE, mildly dilated RV, RAE, increased left atrial pressure, moderate to severe TR and mildly increased  PA systolic pressure.    Social History   Occupational History  . retired Retired    Social research officer, government.  [employer]   Social History Main Topics  . Smoking status: Former Smoker    Packs/day: 2.00    Years: 5.00    Quit date: 10/04/1967  . Smokeless tobacco: Never Used  . Alcohol use 4.2 oz/week    7 Shots of liquor per week  . Drug use: No  . Sexual activity: Not on file

## 2017-03-23 ENCOUNTER — Other Ambulatory Visit (HOSPITAL_BASED_OUTPATIENT_CLINIC_OR_DEPARTMENT_OTHER): Payer: Medicare Other

## 2017-03-23 DIAGNOSIS — D472 Monoclonal gammopathy: Secondary | ICD-10-CM | POA: Diagnosis not present

## 2017-03-23 LAB — CBC & DIFF AND RETIC
BASO%: 0.3 % (ref 0.0–2.0)
Basophils Absolute: 0 10*3/uL (ref 0.0–0.1)
EOS%: 1.8 % (ref 0.0–7.0)
Eosinophils Absolute: 0.1 10*3/uL (ref 0.0–0.5)
HEMATOCRIT: 34 % — AB (ref 38.4–49.9)
HGB: 12 g/dL — ABNORMAL LOW (ref 13.0–17.1)
Immature Retic Fract: 2 % — ABNORMAL LOW (ref 3.00–10.60)
LYMPH%: 25.8 % (ref 14.0–49.0)
MCH: 33.6 pg — ABNORMAL HIGH (ref 27.2–33.4)
MCHC: 35.3 g/dL (ref 32.0–36.0)
MCV: 95.2 fL (ref 79.3–98.0)
MONO#: 0.7 10*3/uL (ref 0.1–0.9)
MONO%: 10.4 % (ref 0.0–14.0)
NEUT#: 4.2 10*3/uL (ref 1.5–6.5)
NEUT%: 61.7 % (ref 39.0–75.0)
Platelets: 213 10*3/uL (ref 140–400)
RBC: 3.57 10*6/uL — AB (ref 4.20–5.82)
RDW: 12.6 % (ref 11.0–14.6)
RETIC CT ABS: 46.77 10*3/uL (ref 34.80–93.90)
Retic %: 1.31 % (ref 0.80–1.80)
WBC: 6.8 10*3/uL (ref 4.0–10.3)
lymph#: 1.8 10*3/uL (ref 0.9–3.3)
nRBC: 0 % (ref 0–0)

## 2017-03-23 LAB — COMPREHENSIVE METABOLIC PANEL
ALT: 28 U/L (ref 0–55)
AST: 29 U/L (ref 5–34)
Albumin: 3.8 g/dL (ref 3.5–5.0)
Alkaline Phosphatase: 59 U/L (ref 40–150)
Anion Gap: 10 mEq/L (ref 3–11)
BUN: 17.8 mg/dL (ref 7.0–26.0)
CHLORIDE: 99 meq/L (ref 98–109)
CO2: 26 mEq/L (ref 22–29)
Calcium: 9.6 mg/dL (ref 8.4–10.4)
Creatinine: 1.3 mg/dL (ref 0.7–1.3)
EGFR: 51 mL/min/{1.73_m2} — AB (ref >90)
GLUCOSE: 103 mg/dL (ref 70–140)
Potassium: 4.5 mEq/L (ref 3.5–5.1)
SODIUM: 135 meq/L — AB (ref 136–145)
Total Bilirubin: 1 mg/dL (ref 0.20–1.20)
Total Protein: 6.8 g/dL (ref 6.4–8.3)

## 2017-03-24 LAB — KAPPA/LAMBDA LIGHT CHAINS
IG LAMBDA FREE LIGHT CHAIN: 18.6 mg/L (ref 5.7–26.3)
Ig Kappa Free Light Chain: 36.9 mg/L — ABNORMAL HIGH (ref 3.3–19.4)
KAPPA/LAMBDA FLC RATIO: 1.98 — AB (ref 0.26–1.65)

## 2017-03-27 LAB — MULTIPLE MYELOMA PANEL, SERUM
ALBUMIN SERPL ELPH-MCNC: 3.7 g/dL (ref 2.9–4.4)
Albumin/Glob SerPl: 1.4 (ref 0.7–1.7)
Alpha 1: 0.2 g/dL (ref 0.0–0.4)
Alpha2 Glob SerPl Elph-Mcnc: 0.8 g/dL (ref 0.4–1.0)
B-Globulin SerPl Elph-Mcnc: 0.9 g/dL (ref 0.7–1.3)
Gamma Glob SerPl Elph-Mcnc: 0.9 g/dL (ref 0.4–1.8)
Globulin, Total: 2.8 g/dL (ref 2.2–3.9)
IGM (IMMUNOGLOBIN M), SRM: 18 mg/dL (ref 15–143)
IgA, Qn, Serum: 241 mg/dL (ref 61–437)
IgG, Qn, Serum: 918 mg/dL (ref 700–1600)
M Protein SerPl Elph-Mcnc: 0.5 g/dL — ABNORMAL HIGH
Total Protein: 6.5 g/dL (ref 6.0–8.5)

## 2017-03-30 ENCOUNTER — Telehealth: Payer: Self-pay | Admitting: Hematology and Oncology

## 2017-03-30 ENCOUNTER — Ambulatory Visit (HOSPITAL_BASED_OUTPATIENT_CLINIC_OR_DEPARTMENT_OTHER): Payer: Medicare Other | Admitting: Hematology and Oncology

## 2017-03-30 VITALS — BP 117/67 | HR 68 | Temp 98.8°F | Resp 18 | Ht 69.0 in | Wt 159.9 lb

## 2017-03-30 DIAGNOSIS — D472 Monoclonal gammopathy: Secondary | ICD-10-CM

## 2017-03-30 DIAGNOSIS — D630 Anemia in neoplastic disease: Secondary | ICD-10-CM

## 2017-03-30 NOTE — Telephone Encounter (Signed)
Scheduled appt per 6/28 los - Gave patient AVS and calender per los.  

## 2017-03-31 NOTE — Assessment & Plan Note (Signed)
His blood work and M spike is relatively stable. Clinically, he has no signs of disease progression. There were evidence of chronic anemia but that appears to be stable I recommend yearly examination and blood work.  I would defer 24-hour urine collection to be done only if needed and there is clinical suspicion that he has disease progression.

## 2017-03-31 NOTE — Progress Notes (Signed)
[facility] OFFICE PROGRESS NOTE  Patient Care Team: Marton Redwood, MD as PCP - General (Internal Medicine) Inda Castle, MD as Attending Physician (Gastroenterology) Martinique, Peter M, MD as Attending Physician (Cardiology) Jarome Matin, MD as Consulting Physician (Dermatology) System, Provider Not In Heath Lark, MD as Consulting Physician (Hematology and Oncology)  SUMMARY OF ONCOLOGIC HISTORY:  This is a pleasant gentleman who was found to have anemia and abnormal M spike. He had further workup including blood work, urine test, skeletal survey as well as bone marrow aspirate and biopsy. He was placed on observation. He also has abnormal lung nodule, observed with serial imaging CT scan. Last CT scan of the chest dated June 2016, confirmed benign lung nodules with no further follow-up recommended   INTERVAL HISTORY: Please see below for problem oriented charting. He returns with his wife He feels well Denies recurrent infection No new bone pain.  Appetite is stable, no recent weight loss.  REVIEW OF SYSTEMS:   Constitutional: Denies fevers, chills or abnormal weight loss Eyes: Denies blurriness of vision Ears, nose, mouth, throat, and face: Denies mucositis or sore throat Respiratory: Denies cough, dyspnea or wheezes Cardiovascular: Denies palpitation, chest discomfort or lower extremity swelling Gastrointestinal:  Denies nausea, heartburn or change in bowel habits Skin: Denies abnormal skin rashes Lymphatics: Denies new lymphadenopathy or easy bruising Neurological:Denies numbness, tingling or new weaknesses Behavioral/Psych: Mood is stable, no new changes  All other systems were reviewed with the patient and are negative.  I have reviewed the past medical history, past surgical history, social history and family history with the patient and they are unchanged from previous note.  ALLERGIES:  is allergic to cortisone.  MEDICATIONS:  Current Outpatient  Prescriptions  Medication Sig Dispense Refill  . amLODipine (NORVASC) 2.5 MG tablet Take 2.5 mg by mouth daily.    . Ascorbic Acid (VITAMIN C) 100 MG tablet Take 100 mg by mouth 2 (two) times daily.     Marland Kitchen aspirin 81 MG tablet Take 81 mg by mouth daily.    Marland Kitchen atenolol (TENORMIN) 25 MG tablet Take 25 mg by mouth daily.     Marland Kitchen atorvastatin (LIPITOR) 20 MG tablet Take 10 mg by mouth daily.     . B Complex Vitamins (VITAMIN B COMPLEX PO) Take 1 tablet by mouth daily.    . Cholecalciferol (VITAMIN D PO) Take 1,000 Units by mouth daily.     Marland Kitchen ezetimibe (ZETIA) 10 MG tablet Take 10 mg by mouth daily.      Marland Kitchen gabapentin (NEURONTIN) 300 MG capsule Take 300 mg by mouth daily as needed.    Marland Kitchen HYDROcodone-acetaminophen (VICODIN) 5-500 MG per tablet Take 0.5 tablets by mouth as needed for pain (3-4 per week).     Marland Kitchen levothyroxine (SYNTHROID, LEVOTHROID) 50 MCG tablet Take 50 mcg by mouth daily before breakfast.    . quinapril (ACCUPRIL) 20 MG tablet Take 20 mg by mouth every morning.      No current facility-administered medications for this visit.     PHYSICAL EXAMINATION: ECOG PERFORMANCE STATUS: 0 - Asymptomatic  Vitals:   03/30/17 1334  BP: 117/67  Pulse: 68  Resp: 18  Temp: 98.8 F (37.1 C)   Filed Weights   03/30/17 1334  Weight: 159 lb 14.4 oz (72.5 kg)    GENERAL:alert, no distress and comfortable SKIN: skin color, texture, turgor are normal, no rashes or significant lesions EYES: normal, Conjunctiva are pink and non-injected, sclera clear OROPHARYNX:no exudate, no erythema and  lips, buccal mucosa, and tongue normal  NECK: supple, thyroid normal size, non-tender, without nodularity LYMPH:  no palpable lymphadenopathy in the cervical, axillary or inguinal LUNGS: clear to auscultation and percussion with normal breathing effort HEART: regular rate & rhythm and no murmurs and no lower extremity edema ABDOMEN:abdomen soft, non-tender and normal bowel sounds Musculoskeletal:no cyanosis of  digits and no clubbing  NEURO: alert & oriented x 3 with fluent speech, no focal motor/sensory deficits  LABORATORY DATA:  I have reviewed the data as listed    Component Value Date/Time   NA 135 (L) 03/23/2017 1316   K 4.5 03/23/2017 1316   CL 94 (L) 03/20/2013 1510   CO2 26 03/23/2017 1316   GLUCOSE 103 03/23/2017 1316   GLUCOSE 122 (H) 03/20/2013 1510   BUN 17.8 03/23/2017 1316   CREATININE 1.3 03/23/2017 1316   CALCIUM 9.6 03/23/2017 1316   PROT 6.5 03/23/2017 1317   PROT 6.8 03/23/2017 1316   ALBUMIN 3.8 03/23/2017 1316   AST 29 03/23/2017 1316   ALT 28 03/23/2017 1316   ALKPHOS 59 03/23/2017 1316   BILITOT 1.00 03/23/2017 1316    No results found for: SPEP, UPEP  Lab Results  Component Value Date   WBC 6.8 03/23/2017   NEUTROABS 4.2 03/23/2017   HGB 12.0 (L) 03/23/2017   HCT 34.0 (L) 03/23/2017   MCV 95.2 03/23/2017   PLT 213 03/23/2017      Chemistry      Component Value Date/Time   NA 135 (L) 03/23/2017 1316   K 4.5 03/23/2017 1316   CL 94 (L) 03/20/2013 1510   CO2 26 03/23/2017 1316   BUN 17.8 03/23/2017 1316   CREATININE 1.3 03/23/2017 1316      Component Value Date/Time   CALCIUM 9.6 03/23/2017 1316   ALKPHOS 59 03/23/2017 1316   AST 29 03/23/2017 1316   ALT 28 03/23/2017 1316   BILITOT 1.00 03/23/2017 1316       RADIOGRAPHIC STUDIES: I have personally reviewed the radiological images as listed and agreed with the findings in the report. Xr Shoulder Right  Result Date: 03/02/2017 3 views including AP, outlet and axillary views of the right shoulder show well located shoulder with no acute findings. The glenohumeral joint and acromioclavicular joints are still well maintained.   ASSESSMENT & PLAN:  Monoclonal gammopathy His blood work and M spike is relatively stable. Clinically, he has no signs of disease progression. There were evidence of chronic anemia but that appears to be stable I recommend yearly examination and blood work.  I  would defer 24-hour urine collection to be done only if needed and there is clinical suspicion that he has disease progression.  Anemia in neoplastic disease This is likely anemia of chronic disease. The patient denies recent history of bleeding such as epistaxis, hematuria or hematochezia. He is asymptomatic from the anemia. We will observe for now.     Orders Placed This Encounter  Procedures  . CBC with Differential/Platelet    Standing Status:   Future    Standing Expiration Date:   05/04/2018  . Comprehensive metabolic panel    Standing Status:   Future    Standing Expiration Date:   05/04/2018  . Kappa/lambda light chains    Standing Status:   Future    Standing Expiration Date:   05/04/2018  . Multiple Myeloma Panel (SPEP&IFE w/QIG)    Standing Status:   Future    Standing Expiration Date:   05/04/2018  All questions were answered. The patient knows to call the clinic with any problems, questions or concerns. No barriers to learning was detected. I spent 10 minutes counseling the patient face to face. The total time spent in the appointment was 15 minutes and more than 50% was on counseling and review of test results     Heath Lark, MD 03/31/2017 6:50 AM

## 2017-03-31 NOTE — Assessment & Plan Note (Signed)
This is likely anemia of chronic disease. The patient denies recent history of bleeding such as epistaxis, hematuria or hematochezia. He is asymptomatic from the anemia. We will observe for now.  

## 2017-07-20 ENCOUNTER — Ambulatory Visit (INDEPENDENT_AMBULATORY_CARE_PROVIDER_SITE_OTHER): Payer: Medicare Other | Admitting: Orthopaedic Surgery

## 2017-07-20 DIAGNOSIS — G8929 Other chronic pain: Secondary | ICD-10-CM

## 2017-07-20 DIAGNOSIS — M25512 Pain in left shoulder: Secondary | ICD-10-CM

## 2017-07-20 DIAGNOSIS — M25511 Pain in right shoulder: Secondary | ICD-10-CM | POA: Diagnosis not present

## 2017-07-20 MED ORDER — METHYLPREDNISOLONE ACETATE 40 MG/ML IJ SUSP
40.0000 mg | INTRAMUSCULAR | Status: AC | PRN
  Administered 2017-07-20: 40 mg via INTRA_ARTICULAR

## 2017-07-20 MED ORDER — LIDOCAINE HCL 1 % IJ SOLN
3.0000 mL | INTRAMUSCULAR | Status: AC | PRN
  Administered 2017-07-20: 3 mL

## 2017-07-20 NOTE — Progress Notes (Signed)
Office Visit Note   Patient: Mitchell Fields           Date of Birth: 02-Jun-1936           MRN: [id number] Visit Date: 07/20/2017              Requested by: Marton Redwood, MD [address] [place], [place] [address] PCP: Marton Redwood, MD   Assessment & Plan: Visit Diagnoses:  1. Chronic pain of both shoulders     Plan: He tolerated both steroid injections well. I agree with providing these as well and he understands the risk and benefits of steroid injections. He'll follow-up as needed.  Follow-Up Instructions: Return if symptoms worsen or fail to improve.   Orders:  Orders Placed This Encounter  Procedures  . Large Joint Injection/Arthrocentesis  . Large Joint Injection/Arthrocentesis   No orders of the defined types were placed in this encounter.     Procedures: Large Joint Inj Date/Time: 07/20/2017 3:10 PM Performed by: Mcarthur Rossetti Authorized by: Jean Rosenthal Y   Location:  Shoulder Site:  R subacromial bursa Ultrasound Guidance: No   Fluoroscopic Guidance: No   Arthrogram: No   Medications:  3 mL lidocaine 1 %; 40 mg methylPREDNISolone acetate 40 MG/ML Large Joint Inj Date/Time: 07/20/2017 3:10 PM Performed by: Mcarthur Rossetti Authorized by: Mcarthur Rossetti   Location:  Shoulder Site:  L subacromial bursa Ultrasound Guidance: No   Fluoroscopic Guidance: No   Arthrogram: No   Medications:  3 mL lidocaine 1 %; 40 mg methylPREDNISolone acetate 40 MG/ML     Clinical Data: No additional findings.   Subjective: No chief complaint on file. The patient is well-known to me. He comes in occasionally for bilateral shoulder steroid injections due to impingement syndrome. He also likely has rotator cuff deficiency shoulders at age 81. He last had injections in June. He is playing golf tomorrow and then heading to a trip to [place] next week. He would like to have steroid injections in both shoulders today. He's had no other  changes in medical status is doing well overall other than bilateral shoulder pain.  HPI  Review of Systems He currently denies a headache, chest pain, shortness of breath, fever, chills, nausea, vomiting.  Objective: Vital Signs: There were no vitals taken for this visit.  Physical Exam He is alert and oriented 81 and in no acute distress Ortho Exam Examination of both shoulders show positive Neer and Hawkins signs with weakness the rotator cuff. Specialty Comments:  No specialty comments available.  Imaging: No results found.   PMFS History: Patient Active Problem List   Diagnosis Date Noted  . Acute pain of right shoulder 03/02/2017  . Impingement syndrome of right shoulder 03/02/2017  . Anemia in neoplastic disease 03/13/2014  . Monoclonal gammopathy   . Lung nodules 03/11/2013  . Abnormal weight loss 01/28/2013  . CAD (coronary artery disease) 08/03/2011  . HTN (hypertension) 08/03/2011  . Hyperlipidemia 08/03/2011  . Valvular heart disease 08/03/2011   Past Medical History:  Diagnosis Date  . Anemia   . Arthritis   . Back pain   . Bursitis of left hip   . Coronary artery disease    TWO VESSEL BYPASS SURGERY in January 1995  . Diverticulosis   . Hyperlipidemia   . Hypertension   . Monoclonal gammopathy   . Neuropathy ([place])     Family History  Problem Relation Age of Onset  . Lung cancer Father 90  smoker  . Diabetes Father   . Cancer Sister        breast in one; "bone" cancer in other    Past Surgical History:  Procedure Laterality Date  . ADENOIDECTOMY    . [place], 2002   X3  . CORONARY ARTERY BYPASS GRAFT     X2. LIMA GRAFT AND A RIMA GRAFT.  Marland Kitchen INGUINAL HERNIA REPAIR Left   . Negative stress echo  08/11/2009  . US ECHOCARDIOGRAPHY  June 2012   Normal EF, mild aortic regurgitation, mild MR, LAE, mildly dilated RV, RAE, increased left atrial pressure, moderate to severe TR and mildly increased  PA systolic pressure.     Social History   Occupational History  . retired Retired    Social research officer, government.  [employer]   Social History Main Topics  . Smoking status: Former Smoker    Packs/day: 2.00    Years: 5.00    Quit date: 10/04/1967  . Smokeless tobacco: Never Used  . Alcohol use 4.2 oz/week    7 Shots of liquor per week  . Drug use: No  . Sexual activity: Not on file

## 2017-09-20 ENCOUNTER — Ambulatory Visit (INDEPENDENT_AMBULATORY_CARE_PROVIDER_SITE_OTHER): Payer: Medicare Other

## 2017-09-20 ENCOUNTER — Encounter (INDEPENDENT_AMBULATORY_CARE_PROVIDER_SITE_OTHER): Payer: Self-pay | Admitting: Orthopaedic Surgery

## 2017-09-20 ENCOUNTER — Ambulatory Visit (INDEPENDENT_AMBULATORY_CARE_PROVIDER_SITE_OTHER): Payer: Medicare Other | Admitting: Orthopaedic Surgery

## 2017-09-20 DIAGNOSIS — M5441 Lumbago with sciatica, right side: Secondary | ICD-10-CM

## 2017-09-20 DIAGNOSIS — G8929 Other chronic pain: Secondary | ICD-10-CM

## 2017-09-20 MED ORDER — DICLOFENAC SODIUM 1 % TD GEL: 2.0000 g | Freq: Four times a day (QID) | TRANSDERMAL | 3 refills | Status: DC

## 2017-09-20 MED ORDER — HYDROCODONE-ACETAMINOPHEN 7.5-325 MG PO TABS: 1.0000 | ORAL_TABLET | Freq: Three times a day (TID) | ORAL | 0 refills | Status: DC | PRN

## 2017-09-20 NOTE — Progress Notes (Signed)
Office Visit Note   Patient: Mitchell Fields           Date of Birth: November 02, 1935           MRN: [id number] Visit Date: 09/20/2017              Requested by: Marton Redwood, MD [address] [place], [place] [address] PCP: Marton Redwood, MD   Assessment & Plan: Visit Diagnoses:  1. Chronic right-sided low back pain with right-sided sciatica     Plan: Combined with the failure of conservative treatment MRI is warranted to help with delineate where an epidural steroid injection could be most helpful.  We will continue his anti-inflammatories and some pain medicine for now we will see him back after the MRI is obtained of his lumbar spine.  Follow-Up Instructions: Return in about 3 weeks (around 10/11/2017).   Orders:  Orders Placed This Encounter  Procedures  . XR Lumbar Spine 2-3 Views   Meds ordered this encounter  Medications  . diclofenac sodium (VOLTAREN) 1 % GEL    Sig: Apply 2 g topically 4 (four) times daily.    Dispense:  100 g    Refill:  3  . HYDROcodone-acetaminophen (NORCO) 7.5-325 MG tablet    Sig: Take 1 tablet by mouth 3 (three) times daily as needed for moderate pain.    Dispense:  40 tablet    Refill:  0      Procedures: No procedures performed   Clinical Data: No additional findings.   Subjective: Chief Complaint  Patient presents with  . Lower Back - Pain  The patient comes in today with right-sided low back pain and sciatic symptoms radiating to his hip all the way behind his knee and into his foot.  He does have peripheral neuropathy.  He has had 3 different back surgeries in [place] but [place]  He has had injections in the lumbar spine in the past and this is helped his sciatica.  He is requesting potentially another injection however we have no MRIs of his back right now.  He does deny any change in bowel bladder function reports weakness in that leg on the right side as well as the numbness and tingling.  Given his radicular  symptoms down his right side and his continued discomfort he is already been on anti-inflammatories.  He is a very thin individual.  His work on activity modification as well as back extension exercises. Marland Kitchen  HPI  Review of Systems He currently denies any headache, chest pain, shortness of breath, fever, chills, nausea, vomiting.  Objective: Vital Signs: There were no vitals taken for this visit.  Physical Exam He is alert and oriented x3 and in no acute distress Ortho Exam Examination of his lumbar spine shows pain with flexion extension lumbar spine but no blocks to flexion and extension.  He has subjective numbness in his feet from peripheral neuropathy.  He has a positive straight leg raise on the right side.  He has pain on stretch his sciatic nerve as well. Specialty Comments:  No specialty comments available.  Imaging: Xr Lumbar Spine 2-3 Views  Result Date: 09/20/2017 An AP and lateral of the lumbar spine show severe arthritic changes from L3 down to S1.  There is multilevel severe degenerative disc disease with disc space narrowing at multiple levels with peri-vertebral osteophytes.    PMFS History: Patient Active Problem List   Diagnosis Date Noted  . Acute pain of right  shoulder 03/02/2017  . Impingement syndrome of right shoulder 03/02/2017  . Anemia in neoplastic disease 03/13/2014  . Monoclonal gammopathy   . Lung nodules 03/11/2013  . Abnormal weight loss 01/28/2013  . CAD (coronary artery disease) 08/03/2011  . HTN (hypertension) 08/03/2011  . Hyperlipidemia 08/03/2011  . Valvular heart disease 08/03/2011   Past Medical History:  Diagnosis Date  . Anemia   . Arthritis   . Back pain   . Bursitis of left hip   . Coronary artery disease    TWO VESSEL BYPASS SURGERY in January 1995  . Diverticulosis   . Hyperlipidemia   . Hypertension   . Monoclonal gammopathy   . Neuropathy ([place])     Family History  Problem Relation Age of Onset  . Lung cancer Father  49       smoker  . Diabetes Father   . Cancer Sister        breast in one; "bone" cancer in other    Past Surgical History:  Procedure Laterality Date  . ADENOIDECTOMY    . [place], 2002   X3  . CORONARY ARTERY BYPASS GRAFT     X2. LIMA GRAFT AND A RIMA GRAFT.  Marland Kitchen INGUINAL HERNIA REPAIR Left   . Negative stress echo  08/11/2009  . US ECHOCARDIOGRAPHY  June 2012   Normal EF, mild aortic regurgitation, mild MR, LAE, mildly dilated RV, RAE, increased left atrial pressure, moderate to severe TR and mildly increased  PA systolic pressure.    Social History   Occupational History  . 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  [employer]  Tobacco Use  . Smoking status: Former Smoker    Packs/day: 2.00    Years: 5.00    Pack years: 10.00    Last attempt to quit: 10/04/1967    Years since quitting: 49.9  . Smokeless tobacco: Never Used  Substance and Sexual Activity  . Alcohol use: Yes    Alcohol/week: 4.2 oz    Types: 7 Shots of liquor per week  . Drug use: No  . Sexual activity: Not on file

## 2017-09-21 ENCOUNTER — Other Ambulatory Visit (INDEPENDENT_AMBULATORY_CARE_PROVIDER_SITE_OTHER): Payer: Self-pay

## 2017-09-21 DIAGNOSIS — M4807 Spinal stenosis, lumbosacral region: Secondary | ICD-10-CM

## 2017-10-05 ENCOUNTER — Ambulatory Visit
Admission: RE | Admit: 2017-10-05 | Discharge: 2017-10-05 | Disposition: A | Discharge status: Home / Self Care | Payer: Medicare Other | Source: Ambulatory Visit | Attending: Orthopaedic Surgery | Admitting: Orthopaedic Surgery

## 2017-10-05 DIAGNOSIS — M4807 Spinal stenosis, lumbosacral region: Secondary | ICD-10-CM

## 2017-10-11 ENCOUNTER — Ambulatory Visit (INDEPENDENT_AMBULATORY_CARE_PROVIDER_SITE_OTHER): Payer: Medicare Other | Admitting: Orthopaedic Surgery

## 2017-10-11 ENCOUNTER — Encounter (INDEPENDENT_AMBULATORY_CARE_PROVIDER_SITE_OTHER): Payer: Self-pay | Admitting: Orthopaedic Surgery

## 2017-10-11 DIAGNOSIS — M5431 Sciatica, right side: Secondary | ICD-10-CM

## 2017-10-11 NOTE — Progress Notes (Signed)
  The patient is here for follow-up after MRI of her lumbar spine.  He has severe sciatic symptoms going down his right lower extremity mainly in his backside to behind his knee.  He said previous spine surgery at L4-L5 and L5-S1.  We sent him for repeat MRI to determine what level that Dr. Ernestina Patches may injection his back.  Has had injections before.  On examination he is got excellent strength in his bilateral extremities and sensation seems to be decreased somewhat in L4 and L5 but his reflexes are normal he is walking around well he just has mainly the sciatic symptoms.  The MRI is compared to previous MRIs and the radiologist feels like his disease is gotten progressively worse in terms of stenosis centrally and foraminally from L3 all the way down to S1 mostly on the right side.  He will be heading to [place] February 17 so he like to have an injection by Dr. Ernestina Patches in his back to the right side at either L4 or L5 the second week of February some time between the 11th and the 14th.  We will work on getting that set up.  All questions concerns were answered and addressed.

## 2017-10-13 ENCOUNTER — Other Ambulatory Visit (INDEPENDENT_AMBULATORY_CARE_PROVIDER_SITE_OTHER): Payer: Self-pay

## 2017-10-13 DIAGNOSIS — M5431 Sciatica, right side: Secondary | ICD-10-CM

## 2017-10-13 DIAGNOSIS — M5432 Sciatica, left side: Principal | ICD-10-CM

## 2017-11-12 NOTE — Progress Notes (Signed)
Cardiology Office Note    Date:  11/15/2017   ID:  Armarion Greek, DOB Jun 30, 1936, MRN [id number]  PCP:  Marton Redwood, MD  Cardiologist:  Tazia Illescas [place], MD    History of Present Illness:  Mitchell Fields is a 82 y.o. male seen for follow up CAD. He has a known history of remote MI, remote CABG 1995 in [place], also has HTN and hyperlipidemia. He had an echo in June 2012 showing normal EF, mild aortic regurgitation, mild MR, mild to moderate LAE, mildly dilated RV, moderate RAE, moderate to severe TR and redundant atrial septum consistent with increased left atrial pressure. Echo in 2014 showed mild LV dysfunction with EF 45-50%. Mild AI and MR. CABG in 1995 included  LIMA and RIMA grafts. He has been diagnosed with monoclonal gammopathy and anemia of chronic disease.   On follow up today he reports he is doing very well from a cardiac standpoint. He denies any chest pain, dyspnea, dizziness, edema. Notes occasional skipped beat when lying down. He stays active. Planning a trip to [place].     Past Medical History:  Diagnosis Date  . Anemia   . Arthritis   . Back pain   . Bursitis of left hip   . Coronary artery disease    TWO VESSEL BYPASS SURGERY in January 1995  . Diverticulosis   . Hyperlipidemia   . Hypertension   . Monoclonal gammopathy   . Neuropathy     Past Surgical History:  Procedure Laterality Date  . ADENOIDECTOMY    . [place], 2002   X3  . CORONARY ARTERY BYPASS GRAFT     X2. LIMA GRAFT AND A RIMA GRAFT.  Marland Kitchen INGUINAL HERNIA REPAIR Left   . Negative stress echo  08/11/2009  . US ECHOCARDIOGRAPHY  June 2012   Normal EF, mild aortic regurgitation, mild MR, LAE, mildly dilated RV, RAE, increased left atrial pressure, moderate to severe TR and mildly increased  PA systolic pressure.     Current Medications: Outpatient Medications Prior to Visit  Medication Sig Dispense Refill  . amLODipine (NORVASC) 2.5 MG tablet Take 2.5 mg by mouth daily.    .  Ascorbic Acid (VITAMIN C) 100 MG tablet Take 100 mg by mouth 2 (two) times daily.     Marland Kitchen aspirin 81 MG tablet Take 81 mg by mouth daily.    Marland Kitchen atenolol (TENORMIN) 25 MG tablet Take 25 mg by mouth daily.     Marland Kitchen atorvastatin (LIPITOR) 20 MG tablet Take 10 mg by mouth daily.     . B Complex Vitamins (VITAMIN B COMPLEX PO) Take 1 tablet by mouth daily.    . Cholecalciferol (VITAMIN D PO) Take 1,000 Units by mouth daily.     . diclofenac sodium (VOLTAREN) 1 % GEL Apply 2 g topically 4 (four) times daily. 100 g 3  . ezetimibe (ZETIA) 10 MG tablet Take 10 mg by mouth daily.      Marland Kitchen gabapentin (NEURONTIN) 300 MG capsule Take 300 mg by mouth daily as needed.    Marland Kitchen HYDROcodone-acetaminophen (NORCO) 7.5-325 MG tablet Take 1 tablet by mouth 3 (three) times daily as needed for moderate pain. 40 tablet 0  . levothyroxine (SYNTHROID, LEVOTHROID) 50 MCG tablet Take 50 mcg by mouth daily before breakfast.    . quinapril (ACCUPRIL) 20 MG tablet Take 20 mg by mouth every morning.      No facility-administered medications prior to visit.      Allergies:  Cortisone   Social History   Socioeconomic History  . Marital status: Married    Spouse name: None  . Number of children: 2  . Years of education: None  . Highest education level: None  Social Needs  . Financial resource strain: None  . Food insecurity - worry: None  . Food insecurity - inability: None  . Transportation needs - medical: None  . Transportation needs - non-medical: None  Occupational History  . 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  [employer]  Tobacco Use  . Smoking status: Former Smoker    Packs/day: 2.00    Years: 5.00    Pack years: 10.00    Last attempt to quit: 10/04/1967    Years since quitting: 50.1  . Smokeless tobacco: Never Used  Substance and Sexual Activity  . Alcohol use: Yes    Alcohol/week: 4.2 oz    Types: 7 Shots of liquor per week  . Drug use: No  . Sexual activity: None  Other  Topics Concern  . None  Social History Narrative  . None     Family History:  The patient's family history includes Cancer in his sister; Diabetes in his father; Lung cancer (age of onset: 49) in his father.   ROS:   Please see the history of present illness.    ROS All other systems reviewed and are negative.   PHYSICAL EXAM:   VS:  BP 120/74   Pulse 67   Ht 5\' 10"  (1.778 m)   Wt 161 lb (73 kg)   BMI 23.10 kg/m    GENERAL:  Well appearing WM in NAD HEENT:  PERRL, EOMI, sclera are clear. Oropharynx is clear. NECK:  No jugular venous distention, carotid upstroke brisk and symmetric, no bruits, no thyromegaly or adenopathy LUNGS:  Clear to auscultation bilaterally CHEST:  Unremarkable HEART:  RRR,  PMI not displaced or sustained,S1 and S2 within normal limits, no S3, no S4: no clicks, no rubs, no murmurs ABD:  Soft, nontender. BS +, no masses or bruits. No hepatomegaly, no splenomegaly EXT:  2 + pulses throughout, no edema, no cyanosis no clubbing. Has TENS unit for his back. SKIN:  Warm and dry.  No rashes NEURO:  Alert and oriented x 3. Cranial nerves II through XII intact. PSYCH:  Cognitively intact    Wt Readings from Last 3 Encounters:  11/15/17 161 lb (73 kg)  03/30/17 159 lb 14.4 oz (72.5 kg)  03/29/16 156 lb 3.2 oz (70.9 kg)      Studies/Labs Reviewed:   EKG:  EKG is ordered today.  The ekg ordered today demonstrates NSR with rate 67. LAFB. Incomplete RBBB. Otherwise normal. I have personally reviewed and interpreted this study.   Labs dated 03/09/16: cholesterol 129, triglycerides 148, HDL 50, LDL 49.  Dated 10/04/16: creatinine 1.5. Other chemistries normal.  Dated 05/17/17: cholesterol 134, triglycerides 150, HDL 48, LDL 56. CBC, CMET, TSH normal.   Recent Labs: 03/23/2017: ALT 28; BUN 17.8; Creatinine 1.3; HGB 12.0; Platelets 213; Potassium 4.5; Sodium 135   Lipid Panel No results found for: CHOL, TRIG, HDL, CHOLHDL, VLDL, LDLCALC, LDLDIRECT  Additional  studies/ records that were reviewed today include:  Echo 03/26/13:- Left ventricle: The cavity size was normal. Wall thickness was normal. Systolic function was mildly reduced. The estimated ejection fraction was in the range of 45% to 50%. There is hypokinesis of the basal-mid inferior posterior myocardium. Doppler parameters are consistent with abnormal left  ventricular relaxation (grade 1 diastolic dysfunction). - Aortic valve: Mild regurgitation. - Mitral valve: Mild regurgitation. - Pulmonary arteries: PA peak pressure: 61mm Hg (S).  ASSESSMENT:    1. Coronary artery disease involving coronary bypass graft of native heart without angina pectoris   2. Essential hypertension   3. Pure hypercholesterolemia      PLAN:  In order of problems listed above:  1. Patient continues to do very well now 24 years post CABG with RIMA and LIMA grafts. He is asymptomatic. Recommend continued medical therapy with risk factor modification.  2. BP is under excellent control. Continue Norvasc, atenolol, accupril. 3. On statin therapy with good control  I will follow up in one year.    Medication Adjustments/Labs and Tests Ordered: Current medicines are reviewed at length with the patient today.  Concerns regarding medicines are outlined above.  Medication changes, Labs and Tests ordered today are listed in the Patient Instructions below. Patient Instructions  Continue your current therapy  I will see you in one year.       Signed, Juni Glaab [place], MD  11/15/2017 4:33 PM    [facility] [address] [place], [place], [address] [phone]

## 2017-11-15 ENCOUNTER — Encounter: Payer: Self-pay | Admitting: Cardiology

## 2017-11-15 ENCOUNTER — Ambulatory Visit: Payer: Medicare Other | Admitting: Cardiology

## 2017-11-15 VITALS — BP 120/74 | HR 67 | Ht 70.0 in | Wt 161.0 lb

## 2017-11-15 DIAGNOSIS — I10 Essential (primary) hypertension: Secondary | ICD-10-CM | POA: Diagnosis not present

## 2017-11-15 DIAGNOSIS — I25810 Atherosclerosis of coronary artery bypass graft(s) without angina pectoris: Secondary | ICD-10-CM | POA: Diagnosis not present

## 2017-11-15 DIAGNOSIS — E7800 Pure hypercholesterolemia, unspecified: Secondary | ICD-10-CM | POA: Diagnosis not present

## 2017-11-15 NOTE — Addendum Note (Signed)
Addended by: Kathyrn Lass on: 11/15/2017 04:40 PM   Modules accepted: Orders

## 2017-11-15 NOTE — Patient Instructions (Signed)
Continue your current therapy  I will see you in one year   

## 2017-11-16 ENCOUNTER — Ambulatory Visit (INDEPENDENT_AMBULATORY_CARE_PROVIDER_SITE_OTHER): Payer: Medicare Other

## 2017-11-16 ENCOUNTER — Ambulatory Visit (INDEPENDENT_AMBULATORY_CARE_PROVIDER_SITE_OTHER): Payer: Medicare Other | Admitting: Physical Medicine and Rehabilitation

## 2017-11-16 ENCOUNTER — Encounter (INDEPENDENT_AMBULATORY_CARE_PROVIDER_SITE_OTHER): Payer: Self-pay | Admitting: Physical Medicine and Rehabilitation

## 2017-11-16 VITALS — BP 113/68 | HR 66 | Temp 97.4°F

## 2017-11-16 DIAGNOSIS — G8929 Other chronic pain: Secondary | ICD-10-CM

## 2017-11-16 DIAGNOSIS — M5416 Radiculopathy, lumbar region: Secondary | ICD-10-CM

## 2017-11-16 DIAGNOSIS — M5116 Intervertebral disc disorders with radiculopathy, lumbar region: Secondary | ICD-10-CM

## 2017-11-16 DIAGNOSIS — M961 Postlaminectomy syndrome, not elsewhere classified: Secondary | ICD-10-CM

## 2017-11-16 DIAGNOSIS — M5441 Lumbago with sciatica, right side: Secondary | ICD-10-CM

## 2017-11-16 MED ORDER — BETAMETHASONE SOD PHOS & ACET 6 (3-3) MG/ML IJ SUSP
12.0000 mg | Freq: Once | INTRAMUSCULAR | Status: AC
  Administered 2017-11-16: 12 mg

## 2017-11-16 NOTE — Patient Instructions (Signed)

## 2017-11-16 NOTE — Progress Notes (Deleted)
Pt states a dull intermittent pain in lower back mostly in the middle. Pt states after back surgeries pain has been getting worse since 2002. Pt states bending over makes pain worse, pt states in home machine relieves pain. +Driver, -BT, -Dye Allergies.

## 2017-11-29 NOTE — Progress Notes (Signed)
Mitchell Fields - 82 y.o. male MRN [id number]  Date of birth: Feb 01, 1936  Office Visit Note: Visit Date: 11/16/2017 PCP: Marton Redwood, MD Referred by: Marton Redwood, MD  Subjective: No chief complaint on file.  HPI: Mitchell Fields is a 82 year old gentleman followed by Dr. Ninfa Linden.  He has been having progressive worsening low back pain and at least according to Dr. Ninfa Linden more right-sided radicular type symptoms on the left.  Worsening with standing and walking and activity and better at rest.  Is not really help with medications at this point. MRI was obtained and reviewed below.  Today the patient is having mostly central low back pain with only radiation into the buttock area bilaterally.  We did elect to complete a diagnostic and hopefully therapeutic bilateral L4 transforaminal injection today.  Injection text    ROS Otherwise per HPI.  Assessment & Plan: Visit Diagnoses:  1. Lumbar radiculopathy   2. Radiculopathy due to lumbar intervertebral disc disorder   3. Post laminectomy syndrome   4. Chronic bilateral low back pain with right-sided sciatica     Plan: No additional findings.   Meds & Orders:  Meds ordered this encounter  Medications  . betamethasone acetate-betamethasone sodium phosphate (CELESTONE) injection 12 mg    Orders Placed This Encounter  Procedures  . XR C-ARM NO REPORT  . Epidural Steroid injection    Follow-up: Return if symptoms worsen or fail to improve.   Procedures: No procedures performed  Lumbosacral Transforaminal Epidural Steroid Injection - Sub-Pedicular Approach with Fluoroscopic Guidance  Patient: Mitchell Fields      Date of Birth: [birth year] MRN: [id number] PCP: Marton Redwood, MD      Visit Date: 11/16/2017   Universal Protocol:    Date/Time: 11/16/2017  Consent Given By: the patient  Position: PRONE  Additional Comments: Vital signs were monitored before and after the procedure. Patient was prepped and draped in the usual  sterile fashion. The correct patient, procedure, and site was verified.   Injection Procedure Details:  Procedure Site One Meds Administered:  Meds ordered this encounter  Medications  . betamethasone acetate-betamethasone sodium phosphate (CELESTONE) injection 12 mg    Laterality: Bilateral  Location/Site:  L4-L5  Needle size: 22 G  Needle type: Spinal  Needle Placement: Transforaminal  Findings:    -Comments: Excellent flow of contrast along the nerve and into the epidural space.  Procedure Details: After squaring off the end-plates to get a true AP view, the C-arm was positioned so that an oblique view of the foramen as noted above was visualized. The target area is just inferior to the "nose of the scotty dog" or sub pedicular. The soft tissues overlying this structure were infiltrated with 2-3 ml. of 1% Lidocaine without Epinephrine.  The spinal needle was inserted toward the target using a "trajectory" view along the fluoroscope beam.  Under AP and lateral visualization, the needle was advanced so it did not puncture dura and was located close the 6 O'Clock position of the pedical in AP tracterory. Biplanar projections were used to confirm position. Aspiration was confirmed to be negative for CSF and/or blood. A 1-2 ml. volume of Isovue-250 was injected and flow of contrast was noted at each level. Radiographs were obtained for documentation purposes.   After attaining the desired flow of contrast documented above, a 0.5 to 1.0 ml test dose of 0.25% Marcaine was injected into each respective transforaminal space.  The patient was observed for 90 seconds post injection.  After no sensory  deficits were reported, and normal lower extremity motor function was noted,   the above injectate was administered so that equal amounts of the injectate were placed at each foramen (level) into the transforaminal epidural space.   Additional Comments:  The patient tolerated the procedure  well Dressing: Band-Aid    Post-procedure details: Patient was observed during the procedure. Post-procedure instructions were reviewed.  Patient left the clinic in stable condition.    Clinical History: MRI LUMBAR SPINE WITHOUT CONTRAST  TECHNIQUE: Multiplanar, multisequence MR imaging of the lumbar spine was performed. No intravenous contrast was administered.  COMPARISON:  Two views lumbar spine from [facility] 09/20/2017. MRI lumbar spine 05/22/2012.  FINDINGS: Segmentation:  Standard.  Alignment: Convex left scoliosis with the apex at L3 is identified. 0.2 cm degenerative retrolisthesis L2 on L3 and trace degenerative retrolisthesis L3 on L4 is unchanged.  Vertebrae: No fracture or worrisome lesion. Degenerative endplate signal change appears worst at L3-4 and L4-5. Congenitally narrow central canal in the lower lumbar segments noted.  Conus medullaris and cauda equina: Conus extends to the T12-L1 level. Conus and cauda equina appear normal.  Paraspinal and other soft tissues: Negative.  Disc levels:  T11-12 is imaged in the sagittal plane only and negative.  T12-L1:  Negative.  L1-2: Very shallow disc bulge and mild facet degenerative disease without central canal or foraminal stenosis, unchanged.  L2-3: There has been progression of disease at this level. The patient has a diffuse broad-based disc bulge with some ligamentum flavum thickening and facet arthropathy. Epidural fat is somewhat prominent. There is moderate central canal stenosis with narrowing in the subarticular recesses bilaterally which could impact either descending L3 root. Mild bilateral foraminal narrowing is present.  L3-4: Marked loss of disc space height is worse than on the prior exam. There is a shallow broad-based disc bulge with endplate spur, right worse than left facet arthropathy and some ligamentum flavum thickening. Moderate to moderately severe  central canal stenosis is present with narrowing of both subarticular recesses which could impact either descending L4 root. Moderate to moderately severe foraminal narrowing is worse on the right.  L4-5: Right paracentral and foraminal protrusion with cephalad extension has increased in size since the prior examination. The protrusion is superimposed on a disc bulge. Marked narrowing is present in the subarticular recesses bilaterally with encroachment on the descending L5 roots. Moderate to moderately severe foraminal narrowing is worse on the right. Right laminotomy defect at this level is identified as on the prior study.  L5-S1: Broad-based left paracentral left disc protrusion with endplate spur extends into the foramen. Left laminotomy defect is identified. There is narrowing in the left subarticular recess and severe left foraminal narrowing. Mild to moderate right foraminal narrowing is present. The appearance of this level is unchanged.  IMPRESSION: Congenitally narrow central spinal canal.  Progression of spondylosis at L4-5 where a right paracentral and foraminal protrusion with cephalad extension has increased in size. The disc encroaches on the exiting right L4 and descending right L5 roots. There is also marked narrowing in the left subarticular recess at this level with encroachment on the descending left L5 root. The patient is status post right laminotomy at this level.  No change in moderate central canal stenosis and narrowing in the subarticular recesses at L2-3 with encroachment on the descending L3 roots.  No change in left subarticular recess and foraminal narrowing at L5-S1.  Progressive loss of disc space height at L3-4 without worsening of central canal, subarticular recess  and foraminal narrowing as described above.   Electronically Signed   By: Inge Rise M.D.   On: 10/06/2017 08:59  He reports that he quit smoking about 50 years  ago. He has a 10.00 pack-year smoking history. he has never used smokeless tobacco. No results for input(s): HGBA1C, LABURIC in the last 8760 hours.  Objective:  VS:  HT:    WT:   BMI:     BP:113/68  HR:66bpm  TEMP:(!) 97.4 F (36.3 C)(Oral)  RESP:98 % Physical Exam  Musculoskeletal:  Patient stands with a forward flexed lumbar spine with pain upon extension.  He has good distal strength.    Ortho Exam Imaging: No results found.  Past Medical/Family/Surgical/Social History: Medications & Allergies reviewed per EMR Patient Active Problem List   Diagnosis Date Noted  . Sciatica, right side 10/11/2017  . Acute pain of right shoulder 03/02/2017  . Impingement syndrome of right shoulder 03/02/2017  . Anemia in neoplastic disease 03/13/2014  . Monoclonal gammopathy   . Lung nodules 03/11/2013  . Abnormal weight loss 01/28/2013  . CAD (coronary artery disease) 08/03/2011  . HTN (hypertension) 08/03/2011  . Hyperlipidemia 08/03/2011  . Valvular heart disease 08/03/2011   Past Medical History:  Diagnosis Date  . Anemia   . Arthritis   . Back pain   . Bursitis of left hip   . Coronary artery disease    TWO VESSEL BYPASS SURGERY in January 1995  . Diverticulosis   . Hyperlipidemia   . Hypertension   . Monoclonal gammopathy   . Neuropathy    Family History  Problem Relation Age of Onset  . Lung cancer Father 66       smoker  . Diabetes Father   . Cancer Sister        breast in one; "bone" cancer in other   Past Surgical History:  Procedure Laterality Date  . ADENOIDECTOMY    . [place], 2002   X3  . CORONARY ARTERY BYPASS GRAFT     X2. LIMA GRAFT AND A RIMA GRAFT.  Marland Kitchen INGUINAL HERNIA REPAIR Left   . Negative stress echo  08/11/2009  . US ECHOCARDIOGRAPHY  June 2012   Normal EF, mild aortic regurgitation, mild MR, LAE, mildly dilated RV, RAE, increased left atrial pressure, moderate to severe TR and mildly increased  PA systolic pressure.    Social  History   Occupational History  . 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  [employer]  Tobacco Use  . Smoking status: Former Smoker    Packs/day: 2.00    Years: 5.00    Pack years: 10.00    Last attempt to quit: 10/04/1967    Years since quitting: 50.1  . Smokeless tobacco: Never Used  Substance and Sexual Activity  . Alcohol use: Yes    Alcohol/week: 4.2 oz    Types: 7 Shots of liquor per week  . Drug use: No  . Sexual activity: Not on file

## 2017-11-29 NOTE — Procedures (Signed)
Lumbosacral Transforaminal Epidural Steroid Injection - Sub-Pedicular Approach with Fluoroscopic Guidance  Patient: Mitchell Fields      Date of Birth: October 27, 1935 MRN: [id number] PCP: Marton Redwood, MD      Visit Date: 11/16/2017   Universal Protocol:    Date/Time: 11/16/2017  Consent Given By: the patient  Position: PRONE  Additional Comments: Vital signs were monitored before and after the procedure. Patient was prepped and draped in the usual sterile fashion. The correct patient, procedure, and site was verified.   Injection Procedure Details:  Procedure Site One Meds Administered:  Meds ordered this encounter  Medications  . betamethasone acetate-betamethasone sodium phosphate (CELESTONE) injection 12 mg    Laterality: Bilateral  Location/Site:  L4-L5  Needle size: 22 G  Needle type: Spinal  Needle Placement: Transforaminal  Findings:    -Comments: Excellent flow of contrast along the nerve and into the epidural space.  Procedure Details: After squaring off the end-plates to get a true AP view, the C-arm was positioned so that an oblique view of the foramen as noted above was visualized. The target area is just inferior to the "nose of the scotty dog" or sub pedicular. The soft tissues overlying this structure were infiltrated with 2-3 ml. of 1% Lidocaine without Epinephrine.  The spinal needle was inserted toward the target using a "trajectory" view along the fluoroscope beam.  Under AP and lateral visualization, the needle was advanced so it did not puncture dura and was located close the 6 O'Clock position of the pedical in AP tracterory. Biplanar projections were used to confirm position. Aspiration was confirmed to be negative for CSF and/or blood. A 1-2 ml. volume of Isovue-250 was injected and flow of contrast was noted at each level. Radiographs were obtained for documentation purposes.   After attaining the desired flow of contrast documented above, a 0.5 to  1.0 ml test dose of 0.25% Marcaine was injected into each respective transforaminal space.  The patient was observed for 90 seconds post injection.  After no sensory deficits were reported, and normal lower extremity motor function was noted,   the above injectate was administered so that equal amounts of the injectate were placed at each foramen (level) into the transforaminal epidural space.   Additional Comments:  The patient tolerated the procedure well Dressing: Band-Aid    Post-procedure details: Patient was observed during the procedure. Post-procedure instructions were reviewed.  Patient left the clinic in stable condition.

## 2018-02-01 ENCOUNTER — Telehealth: Payer: Self-pay

## 2018-02-01 NOTE — Telephone Encounter (Signed)
Per Dr Alvy Bimler:  I am ordering CBC-D, CMP, myeloma panel and free light chains for follow-up I am not sure if Dr. Brigitte Pulse is willing to order those tests. If he is willing, we can cancel lab draw here Please call his PCP and let me know  I have called and spoke with  Dr Raul Del assistant ([place]) pt's request to coordinate lab work and let her know what labs Dr Alvy Bimler is requesting. His assistant is going to speak with Dr Brigitte Pulse and she will call me and then I will touch base with pt.

## 2018-02-01 NOTE — Telephone Encounter (Signed)
Mitchell Raul Del office, Mitchell Fields  confirmed they have pt's orders per Mitchell Alvy Bimler and have put in the orders so those labs will be done there at his appt on May 30 th.  Called pt back and let him know everything has been arranged for Mitchell Alvy Bimler labs to be done with Mitchell Fields his PCP and so I will be able to cancel the lab appt here that was for June 20th.  So he knows he still has his office appt scheduled for June 27th with Mitchell Alvy Bimler.  Pt very appreciative that we could work this out so he doesn't have to go to two lab appts so close together.

## 2018-02-01 NOTE — Telephone Encounter (Signed)
I am ordering CBC-D, CMP, myeloma panel and free light chains for follow-up I am not sure if Dr. Brigitte Pulse is willing to order those tests. If he is willing, we can cancel lab draw here Please call his PCP and let me know

## 2018-02-01 NOTE — Telephone Encounter (Signed)
Received VM from patient and he has made the following request:  He has an appt for lab work on May 30th with his PCP (Annual physical June 6th w/ Dr Brigitte Pulse)  And he has appt for lab work on June 20th then appt with Dr Alvy Bimler on June 27th.     He would like to know if there is anyway Dr Calton Dach lab work can be combined with lab appt scheduled by his PCP for May 30th?   Will route to Dr Alvy Bimler and then call patient back.

## 2018-03-12 ENCOUNTER — Inpatient Hospital Stay: Payer: Medicare Other | Attending: Hematology and Oncology | Admitting: Hematology and Oncology

## 2018-03-12 ENCOUNTER — Encounter: Payer: Self-pay | Admitting: Hematology and Oncology

## 2018-03-12 ENCOUNTER — Telehealth: Payer: Self-pay | Admitting: Hematology and Oncology

## 2018-03-12 DIAGNOSIS — Z7982 Long term (current) use of aspirin: Secondary | ICD-10-CM

## 2018-03-12 DIAGNOSIS — Z79899 Other long term (current) drug therapy: Secondary | ICD-10-CM

## 2018-03-12 DIAGNOSIS — F1010 Alcohol abuse, uncomplicated: Secondary | ICD-10-CM | POA: Diagnosis not present

## 2018-03-12 DIAGNOSIS — G621 Alcoholic polyneuropathy: Secondary | ICD-10-CM | POA: Diagnosis not present

## 2018-03-12 DIAGNOSIS — D472 Monoclonal gammopathy: Secondary | ICD-10-CM

## 2018-03-12 NOTE — Assessment & Plan Note (Signed)
He described 2 separate locations of neuropathy The neuropathy affecting the right thumb, second and third digit likely due to median nerve compression from degenerative arthritis versus carpal tunnel syndrome The neuropathy affecting his feet could be due to alcoholism The patient has been drinking scotch daily for many years Serum vitamin B12 was within normal limits We have extensive discussion about alcohol cessation to allow recovery of neuropathy

## 2018-03-12 NOTE — Assessment & Plan Note (Signed)
I have review all the blood work with the patient and family There is no significant change on his recent myeloma panel I do not believe his peripheral neuropathy is caused by this I will continue to see him once a year He will continue to get blood work done through his primary care doctor's office next year 

## 2018-03-12 NOTE — Telephone Encounter (Signed)
Gave avs and calendar ° °

## 2018-03-12 NOTE — Progress Notes (Signed)
[facility] OFFICE PROGRESS NOTE  Patient Care Team: Marton Redwood, MD as PCP - General (Internal Medicine) Inda Castle, MD (Inactive) as Attending Physician (Gastroenterology) Martinique, Peter M, MD as Attending Physician (Cardiology) Jarome Matin, MD as Consulting Physician (Dermatology) System, Provider Not In Heath Lark, MD as Consulting Physician (Hematology and Oncology)  ASSESSMENT & PLAN:  Monoclonal gammopathy I have review all the blood work with the patient and family There is no significant change on his recent myeloma panel I do not believe his peripheral neuropathy is caused by this I will continue to see him once a year He will continue to get blood work done through his primary care doctor's office next year  Alcoholic peripheral neuropathy ([place]) He described 2 separate locations of neuropathy The neuropathy affecting the right thumb, second and third digit likely due to median nerve compression from degenerative arthritis versus carpal tunnel syndrome The neuropathy affecting his feet could be due to alcoholism The patient has been drinking scotch daily for many years Serum vitamin B12 was within normal limits We have extensive discussion about alcohol cessation to allow recovery of neuropathy   No orders of the defined types were placed in this encounter.   INTERVAL HISTORY: Please see below for problem oriented charting. He returns with his wife for further follow-up We review multiple pages of records He complained of neuropathy affecting the first 3 fingers of his right hand He also have neuropathy affecting both feet He denies recent infection No new bone pain He denies recent falls  SUMMARY OF ONCOLOGIC HISTORY:  This is a pleasant gentleman who was found to have anemia and abnormal M spike. He had further workup including blood work, urine test, skeletal survey as well as bone marrow aspirate and biopsy. He was placed on observation  for IgG kappa MGUS. He also has abnormal lung nodule, observed with serial imaging CT scan. Last CT scan of the chest dated June 2016, confirmed benign lung nodules with no further follow-up recommended  REVIEW OF SYSTEMS:   Constitutional: Denies fevers, chills or abnormal weight loss Eyes: Denies blurriness of vision Ears, nose, mouth, throat, and face: Denies mucositis or sore throat Respiratory: Denies cough, dyspnea or wheezes Cardiovascular: Denies palpitation, chest discomfort or lower extremity swelling Gastrointestinal:  Denies nausea, heartburn or change in bowel habits Skin: Denies abnormal skin rashes Lymphatics: Denies new lymphadenopathy or easy bruising Behavioral/Psych: Mood is stable, no new changes  All other systems were reviewed with the patient and are negative.  I have reviewed the past medical history, past surgical history, social history and family history with the patient and they are unchanged from previous note.  ALLERGIES:  is allergic to cortisone.  MEDICATIONS:  Current Outpatient Medications  Medication Sig Dispense Refill  . amLODipine (NORVASC) 2.5 MG tablet Take 2.5 mg by mouth daily.    . Ascorbic Acid (VITAMIN C) 100 MG tablet Take 100 mg by mouth 2 (two) times daily.     Marland Kitchen aspirin 81 MG tablet Take 81 mg by mouth daily.    Marland Kitchen atenolol (TENORMIN) 25 MG tablet Take 25 mg by mouth daily.     Marland Kitchen atorvastatin (LIPITOR) 20 MG tablet Take 20 mg by mouth daily.     . B Complex Vitamins (VITAMIN B COMPLEX PO) Take 1 tablet by mouth daily.    . Cholecalciferol (VITAMIN D PO) Take 1,000 Units by mouth daily.     Marland Kitchen ezetimibe (ZETIA) 10 MG tablet Take 10 mg  by mouth daily.      Marland Kitchen gabapentin (NEURONTIN) 300 MG capsule Take 300 mg by mouth daily as needed.    Marland Kitchen HYDROcodone-acetaminophen (NORCO) 7.5-325 MG tablet Take 1 tablet by mouth 3 (three) times daily as needed for moderate pain. 40 tablet 0  . levothyroxine (SYNTHROID, LEVOTHROID) 50 MCG tablet Take 50 mcg by  mouth daily before breakfast.    . quinapril (ACCUPRIL) 20 MG tablet Take 20 mg by mouth every morning.      No current facility-administered medications for this visit.     PHYSICAL EXAMINATION: ECOG PERFORMANCE STATUS: 1 - Symptomatic but completely ambulatory  Vitals:   03/12/18 1022  BP: (!) 145/91  Pulse: 63  Resp: 18  Temp: 97.8 F (36.6 C)  SpO2: 98%   Filed Weights   03/12/18 1022  Weight: 161 lb 12.8 oz (73.4 kg)    GENERAL:alert, no distress and comfortable SKIN: skin color, texture, turgor are normal, no rashes or significant lesions Musculoskeletal:no cyanosis of digits and no clubbing  NEURO: alert & oriented x 3 with fluent speech,   LABORATORY DATA:  I have reviewed the data as listed    Component Value Date/Time   NA 135 (L) 03/23/2017 1316   K 4.5 03/23/2017 1316   CL 94 (L) 03/20/2013 1510   CO2 26 03/23/2017 1316   GLUCOSE 103 03/23/2017 1316   GLUCOSE 122 (H) 03/20/2013 1510   BUN 17.8 03/23/2017 1316   CREATININE 1.3 03/23/2017 1316   CALCIUM 9.6 03/23/2017 1316   PROT 6.5 03/23/2017 1317   PROT 6.8 03/23/2017 1316   ALBUMIN 3.8 03/23/2017 1316   AST 29 03/23/2017 1316   ALT 28 03/23/2017 1316   ALKPHOS 59 03/23/2017 1316   BILITOT 1.00 03/23/2017 1316    No results found for: SPEP, UPEP  Lab Results  Component Value Date   WBC 6.8 03/23/2017   NEUTROABS 4.2 03/23/2017   HGB 12.0 (L) 03/23/2017   HCT 34.0 (L) 03/23/2017   MCV 95.2 03/23/2017   PLT 213 03/23/2017      Chemistry      Component Value Date/Time   NA 135 (L) 03/23/2017 1316   K 4.5 03/23/2017 1316   CL 94 (L) 03/20/2013 1510   CO2 26 03/23/2017 1316   BUN 17.8 03/23/2017 1316   CREATININE 1.3 03/23/2017 1316      Component Value Date/Time   CALCIUM 9.6 03/23/2017 1316   ALKPHOS 59 03/23/2017 1316   AST 29 03/23/2017 1316   ALT 28 03/23/2017 1316   BILITOT 1.00 03/23/2017 1316      All questions were answered. The patient knows to call the clinic with any  problems, questions or concerns. No barriers to learning was detected.  I spent 15 minutes counseling the patient face to face. The total time spent in the appointment was 20 minutes and more than 50% was on counseling and review of test results  Heath Lark, MD 03/12/2018 1:32 PM

## 2018-03-22 ENCOUNTER — Other Ambulatory Visit: Payer: Medicare Other

## 2018-03-29 ENCOUNTER — Ambulatory Visit: Payer: Medicare Other | Admitting: Hematology and Oncology

## 2018-09-03 IMAGING — MR MR LUMBAR SPINE W/O CM
5 series · 41 of 48 positions shown · non-contrast
Comparison: Two views lumbar spine from [REDACTED]
09/20/2017. MRI lumbar spine 05/22/2012.

CLINICAL DATA: Low back pain radiating into the right hip, buttock
and leg for 6 months. No known injury. History of prior lumbar
surgery, last in 3338.

EXAM:
MRI LUMBAR SPINE WITHOUT CONTRAST
TECHNIQUE: Multiplanar, multisequence MR imaging of the lumbar spine was
performed. No intravenous contrast was administered.

[Series 3: T2 post-contrast · sagittal · 4.0mm · 0.88mm/px · 6 of 13 slices shown]
[im 1/13]
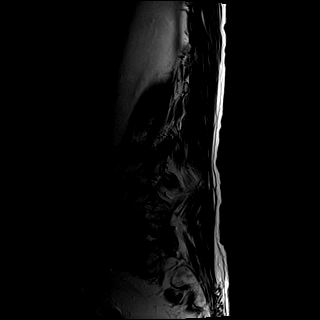
[im 3/13]
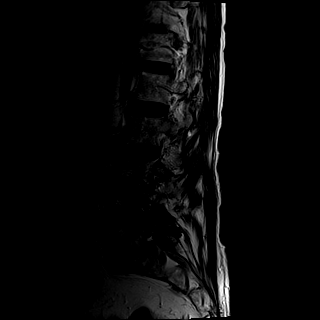
[im 5/13]
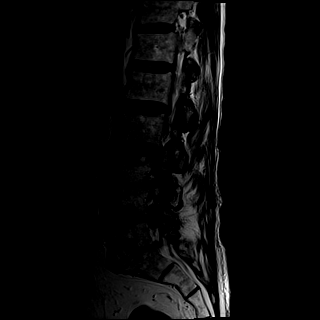
[im 8/13]
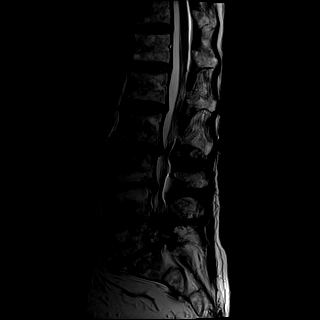
[im 10/13]
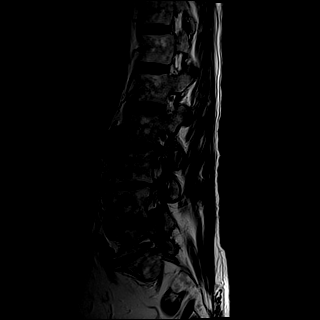
[im 13/13]
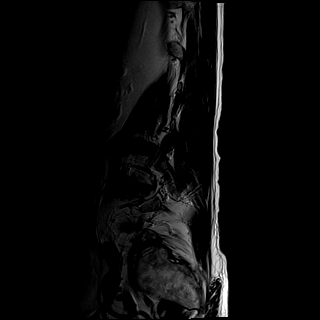

[Series 4: T1 · sagittal · 4.0mm · 1.09mm/px · 6 of 13 slices shown (1 of 2)]
[im 1/13]
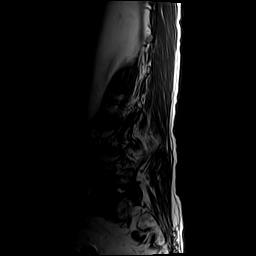
[im 3/13]
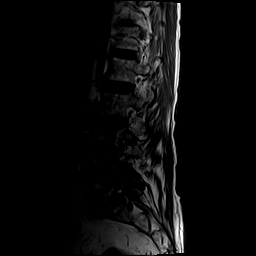
[im 5/13]
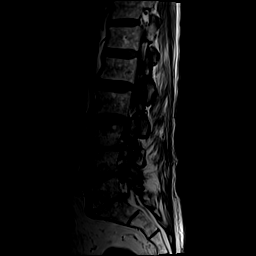
[im 8/13]
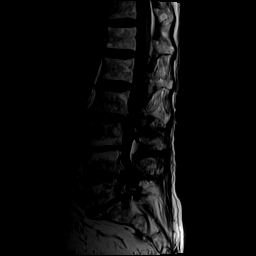
[im 10/13]
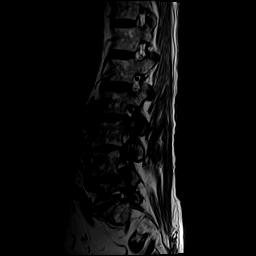
[im 13/13]
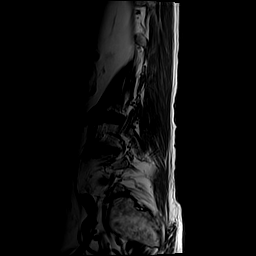

[Series 5: STIR · sagittal · 4.0mm · 0.55mm/px · 6 of 13 slices shown]
[im 1/13]
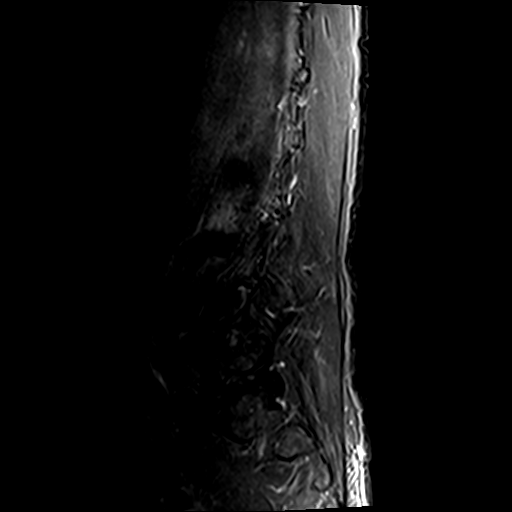
[im 3/13]
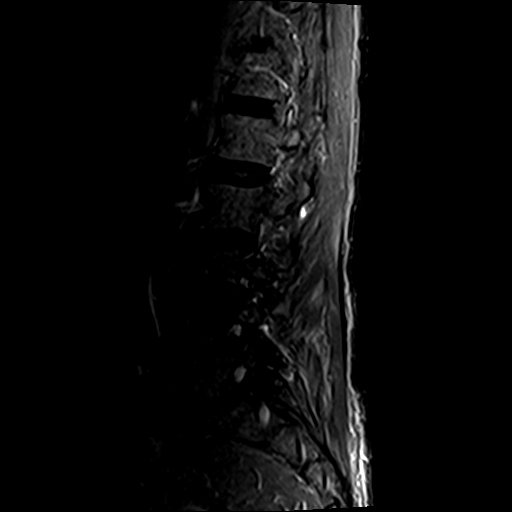
[im 5/13]
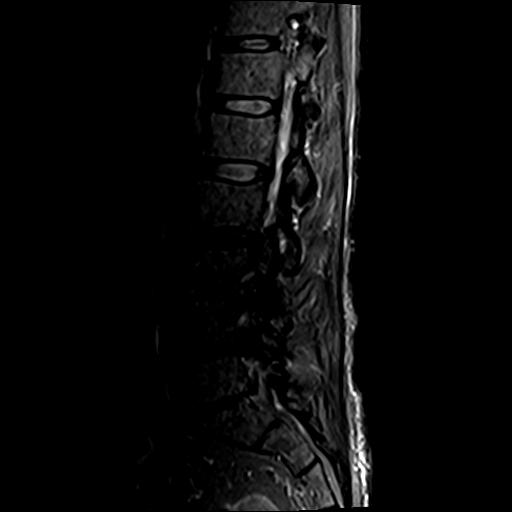
[im 8/13]
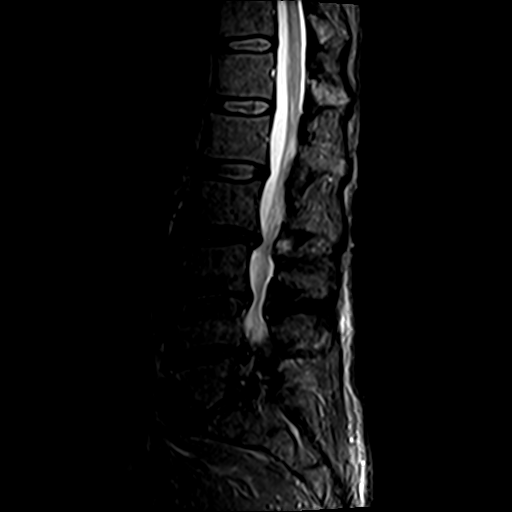
[im 10/13]
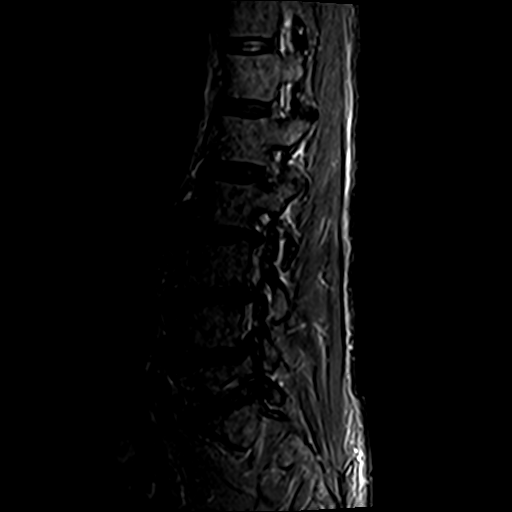
[im 13/13]
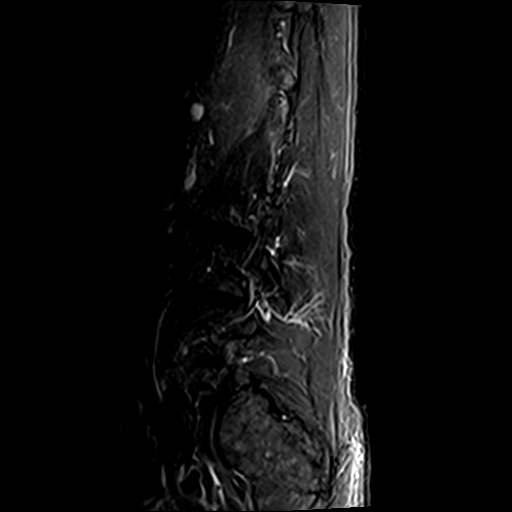

[Series 6: T1 · axial · 4.0mm · 0.78mm/px · z∈[+32,+225]mm · 9 of 30 slices shown (2 of 2)]
[im 1/30]
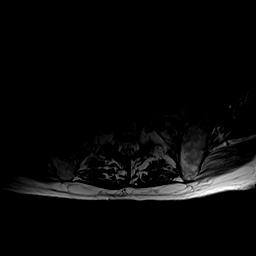
[im 5/30]
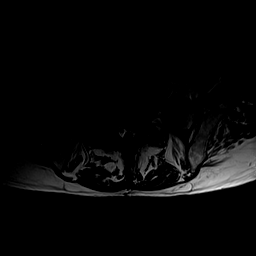
[im 9/30]
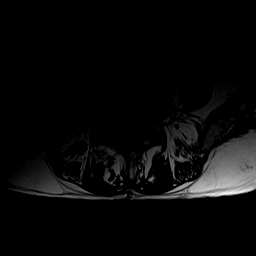
[im 13/30]
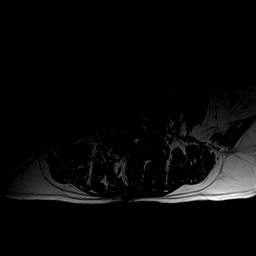
[im 15/30]
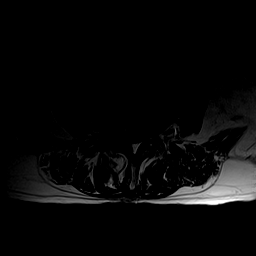
[im 17/30]
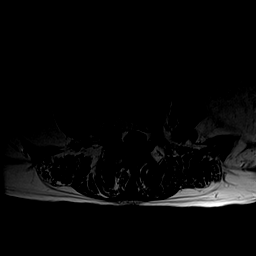
[im 21/30]
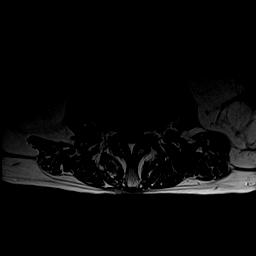
[im 25/30]
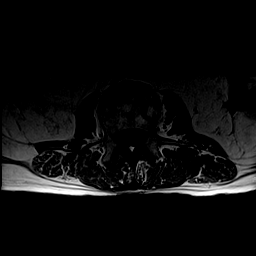
[im 30/30]
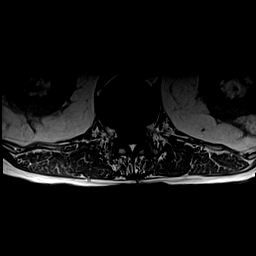

[Series 7: T2 · axial · 4.0mm · 0.78mm/px · z∈[+32,+225]mm · 14 of 30 slices shown]
[im 1/30]
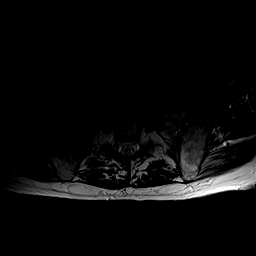
[im 3/30]
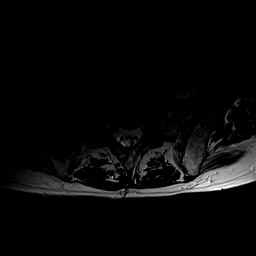
[im 5/30]
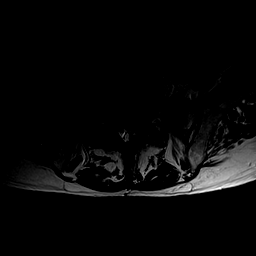
[im 7/30]
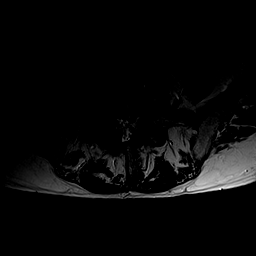
[im 9/30]
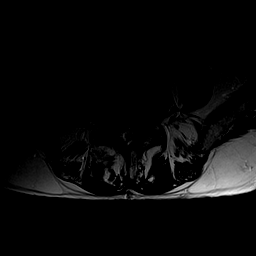
[im 11/30]
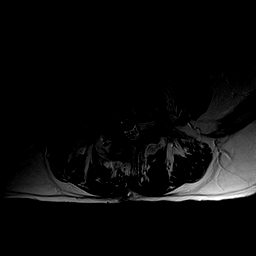
[im 13/30]
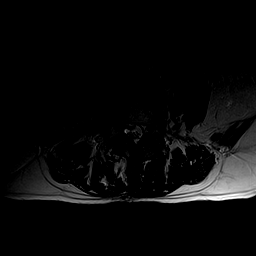
[im 15/30]
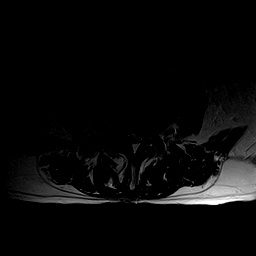
[im 17/30]
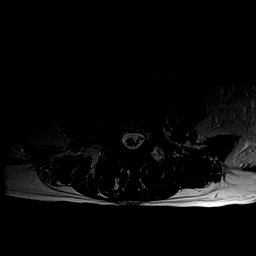
[im 19/30]
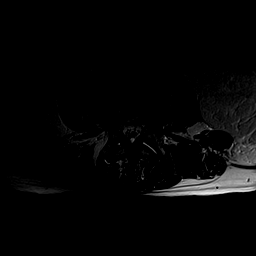
[im 21/30]
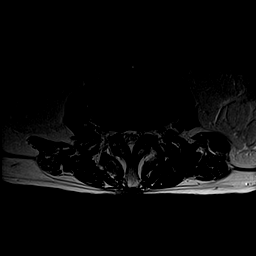
[im 23/30]
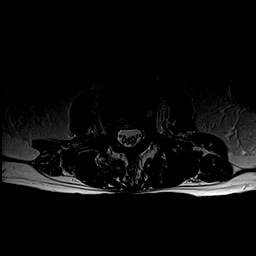
[im 25/30]
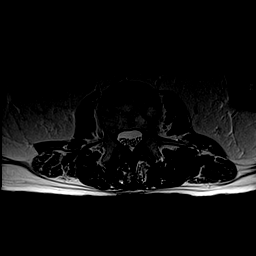
[im 30/30]
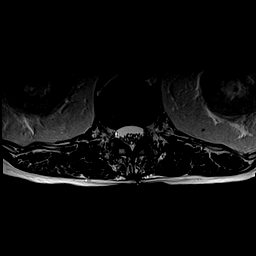

[41 of 48 positions shown; findings below may reference images not displayed]

FINDINGS: Segmentation:  Standard.

Alignment: Convex left scoliosis with the apex at L3 is identified.
0.2 cm degenerative retrolisthesis L2 on L3 and trace degenerative
retrolisthesis L3 on L4 is unchanged.

Vertebrae: No fracture or worrisome lesion. Degenerative endplate
signal change appears worst at L3-4 and L4-5. Congenitally narrow
central canal in the lower lumbar segments noted.

Conus medullaris and cauda equina: Conus extends to the T12-L1
level. Conus and cauda equina appear normal.

Paraspinal and other soft tissues: Negative.

Disc levels:

T11-12 is imaged in the sagittal plane only and negative.

T12-L1:  Negative.

L1-2: Very shallow disc bulge and mild facet degenerative disease
without central canal or foraminal stenosis, unchanged.

L2-3: There has been progression of disease at this level. The
patient has a diffuse broad-based disc bulge with some ligamentum
flavum thickening and facet arthropathy. Epidural fat is somewhat
prominent. There is moderate central canal stenosis with narrowing
in the subarticular recesses bilaterally which could impact either
descending L3 root. Mild bilateral foraminal narrowing is present.

L3-4: Marked loss of disc space height is worse than on the prior
exam. There is a shallow broad-based disc bulge with endplate spur,
right worse than left facet arthropathy and some ligamentum flavum
thickening. Moderate to moderately severe central canal stenosis is
present with narrowing of both subarticular recesses which could
impact either descending L4 root. Moderate to moderately severe
foraminal narrowing is worse on the right.

L4-5: Right paracentral and foraminal protrusion with cephalad
extension has increased in size since the prior examination. The
protrusion is superimposed on a disc bulge. Marked narrowing is
present in the subarticular recesses bilaterally with encroachment
on the descending L5 roots. Moderate to moderately severe foraminal
narrowing is worse on the right. Right laminotomy defect at this
level is identified as on the prior study.

L5-S1: Broad-based left paracentral left disc protrusion with
endplate spur extends into the foramen. Left laminotomy defect is
identified. There is narrowing in the left subarticular recess and
severe left foraminal narrowing. Mild to moderate right foraminal
narrowing is present. The appearance of this level is unchanged.
IMPRESSION: Congenitally narrow central spinal canal.

Progression of spondylosis at L4-5 where a right paracentral and
foraminal protrusion with cephalad extension has increased in size.
The disc encroaches on the exiting right L4 and descending right L5
roots. There is also marked narrowing in the left subarticular
recess at this level with encroachment on the descending left L5
root. The patient is status post right laminotomy at this level.

No change in moderate central canal stenosis and narrowing in the
subarticular recesses at L2-3 with encroachment on the descending L3
roots.

No change in left subarticular recess and foraminal narrowing at
L5-S1.

Progressive loss of disc space height at L3-4 without worsening of
central canal, subarticular recess and foraminal narrowing as
described above.

## 2018-09-19 ENCOUNTER — Ambulatory Visit (INDEPENDENT_AMBULATORY_CARE_PROVIDER_SITE_OTHER): Payer: Medicare Other | Admitting: Orthopaedic Surgery

## 2018-09-19 ENCOUNTER — Encounter (INDEPENDENT_AMBULATORY_CARE_PROVIDER_SITE_OTHER): Payer: Self-pay | Admitting: Orthopaedic Surgery

## 2018-09-19 DIAGNOSIS — G8929 Other chronic pain: Secondary | ICD-10-CM

## 2018-09-19 DIAGNOSIS — M25512 Pain in left shoulder: Secondary | ICD-10-CM

## 2018-09-19 MED ORDER — LIDOCAINE HCL 1 % IJ SOLN
3.0000 mL | INTRAMUSCULAR | Status: AC | PRN
  Administered 2018-09-19: 3 mL

## 2018-09-19 MED ORDER — METHYLPREDNISOLONE ACETATE 40 MG/ML IJ SUSP
40.0000 mg | INTRAMUSCULAR | Status: AC | PRN
  Administered 2018-09-19: 40 mg via INTRA_ARTICULAR

## 2018-09-19 MED ORDER — HYDROCODONE-ACETAMINOPHEN 7.5-325 MG PO TABS: 1.0000 | ORAL_TABLET | Freq: Four times a day (QID) | ORAL | 0 refills | Status: DC | PRN

## 2018-09-19 NOTE — Progress Notes (Signed)
Office Visit Note   Patient: Mitchell Fields           Date of Birth: 06/25/1936           MRN: [id number] Visit Date: 09/19/2018              Requested by: Marton Redwood, MD [address] [place], [place] [address] PCP: Marton Redwood, MD   Assessment & Plan: Visit Diagnoses:  1. Chronic left shoulder pain     Plan: Per his wishes I did provide a steroid injection in his left shoulder subacromial space.  He understands fully the risk and benefits of injections having had them before.  He tolerated very well.  Follow-up will be as needed.  I did send in some hydrocodone for him.  Follow-Up Instructions: Return if symptoms worsen or fail to improve.   Orders:  Orders Placed This Encounter  Procedures  . Large Joint Inj   Meds ordered this encounter  Medications  . HYDROcodone-acetaminophen (NORCO) 7.5-325 MG tablet    Sig: Take 1 tablet by mouth every 6 (six) hours as needed for moderate pain.    Dispense:  30 tablet    Refill:  0      Procedures: Large Joint Inj: L subacromial bursa on 09/19/2018 1:06 PM Indications: pain and diagnostic evaluation Details: 22 G 1.5 in needle  Arthrogram: No  Medications: 3 mL lidocaine 1 %; 40 mg methylPREDNISolone acetate 40 MG/ML Outcome: tolerated well, no immediate complications Procedure, treatment alternatives, risks and benefits explained, specific risks discussed. Consent was given by the patient. Immediately prior to procedure a time out was called to verify the correct patient, procedure, equipment, support staff and site/side marked as required. Patient was prepped and draped in the usual sterile fashion.       Clinical Data: No additional findings.   Subjective: Chief Complaint  Patient presents with  . Left Shoulder - Pain  The patient comes in today with significant left shoulder pain.  He is a 82 year old gentleman who is heading on a cruise next week and would like to have a shoulder injection today.  Has had  this in the past on his right side.  Denies any specific injury.  It hurts with overhead activities and reaching behind him.  He denies any neck pain denies any numbness tingling in his hand.  HPI  Review of Systems He currently denies any headache, chest pain, shortness of breath, fever, chills, nausea, vomiting  Objective: Vital Signs: There were no vitals taken for this visit.  Physical Exam He is alert and orient x3 and in no acute distress Ortho Exam Examination of his left shoulder shows no deficit in the rotator cuff but positive Neer and Hawkins sign with pain with range of motion and pain with reaching behind him but no deficits. Specialty Comments:  No specialty comments available.  Imaging: No results found.   PMFS History: Patient Active Problem List   Diagnosis Date Noted  . Alcoholic peripheral neuropathy ([place]) 03/12/2018  . Sciatica, right side 10/11/2017  . Acute pain of right shoulder 03/02/2017  . Impingement syndrome of right shoulder 03/02/2017  . Anemia in neoplastic disease 03/13/2014  . Monoclonal gammopathy   . Lung nodules 03/11/2013  . Abnormal weight loss 01/28/2013  . CAD (coronary artery disease) 08/03/2011  . HTN (hypertension) 08/03/2011  . Hyperlipidemia 08/03/2011  . Valvular heart disease 08/03/2011   Past Medical History:  Diagnosis Date  . Anemia   . Arthritis   .  Back pain   . Bursitis of left hip   . Coronary artery disease    TWO VESSEL BYPASS SURGERY in January 1995  . Diverticulosis   . Hyperlipidemia   . Hypertension   . Monoclonal gammopathy   . Neuropathy     Family History  Problem Relation Age of Onset  . Lung cancer Father 49       smoker  . Diabetes Father   . Cancer Sister        breast in one; "bone" cancer in other    Past Surgical History:  Procedure Laterality Date  . ADENOIDECTOMY    . [place], 2002   X3  . CORONARY ARTERY BYPASS GRAFT     X2. LIMA GRAFT AND A RIMA GRAFT.  Marland Kitchen  INGUINAL HERNIA REPAIR Left   . Negative stress echo  08/11/2009  . US ECHOCARDIOGRAPHY  June 2012   Normal EF, mild aortic regurgitation, mild MR, LAE, mildly dilated RV, RAE, increased left atrial pressure, moderate to severe TR and mildly increased  PA systolic pressure.    Social History   Occupational History  . 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  [employer]  Tobacco Use  . Smoking status: Former Smoker    Packs/day: 2.00    Years: 5.00    Pack years: 10.00    Last attempt to quit: 10/04/1967    Years since quitting: 50.9  . Smokeless tobacco: Never Used  Substance and Sexual Activity  . Alcohol use: Yes    Alcohol/week: 7.0 standard drinks    Types: 7 Shots of liquor per week  . Drug use: No  . Sexual activity: Not on file

## 2018-09-20 ENCOUNTER — Ambulatory Visit (INDEPENDENT_AMBULATORY_CARE_PROVIDER_SITE_OTHER): Payer: Medicare Other | Admitting: Orthopaedic Surgery

## 2018-11-12 NOTE — Progress Notes (Signed)
Cardiology Office Note    Date:  11/15/2018   ID:  Mitchell Fields, DOB 26-Jul-1936, MRN [id number]  PCP:  Marton Redwood, MD  Cardiologist:   [place], MD    History of Present Illness:  Mitchell Fields is a 83 y.o. male seen for follow up CAD. He has a known history of remote MI, remote CABG 1995 in [place], also has HTN and hyperlipidemia. He had an echo in June 2012 showing normal EF, mild aortic regurgitation, mild MR, mild to moderate LAE, mildly dilated RV, moderate RAE, moderate to severe TR and redundant atrial septum consistent with increased left atrial pressure. Echo in 2014 showed mild LV dysfunction with EF 45-50%. Mild AI and MR. CABG in 1995 included  LIMA and RIMA grafts. He has been diagnosed with monoclonal gammopathy and anemia of chronic disease.   On follow up today he reports he is doing very well from a cardiac standpoint. He is fairly inactive during the winter but is usually very active in the warmer months. He is travelling extensively. He denies any chest pain, dyspnea, dizziness, edema. He does have chronic weakness in his legs that he states has been present for years.     Past Medical History:  Diagnosis Date  . Anemia   . Arthritis   . Back pain   . Bursitis of left hip   . Coronary artery disease    TWO VESSEL BYPASS SURGERY in January 1995  . Diverticulosis   . Hyperlipidemia   . Hypertension   . Monoclonal gammopathy   . Neuropathy     Past Surgical History:  Procedure Laterality Date  . ADENOIDECTOMY    . [place], 2002   X3  . CORONARY ARTERY BYPASS GRAFT     X2. LIMA GRAFT AND A RIMA GRAFT.  Marland Kitchen INGUINAL HERNIA REPAIR Left   . Negative stress echo  08/11/2009  . US ECHOCARDIOGRAPHY  June 2012   Normal EF, mild aortic regurgitation, mild MR, LAE, mildly dilated RV, RAE, increased left atrial pressure, moderate to severe TR and mildly increased  PA systolic pressure.     Current Medications: Outpatient Medications Prior  to Visit  Medication Sig Dispense Refill  . amLODipine (NORVASC) 2.5 MG tablet Take 2.5 mg by mouth daily.    . Ascorbic Acid (VITAMIN C) 100 MG tablet Take 100 mg by mouth 2 (two) times daily.     Marland Kitchen aspirin 81 MG tablet Take 81 mg by mouth daily.    Marland Kitchen atenolol (TENORMIN) 25 MG tablet Take 25 mg by mouth daily.     Marland Kitchen atorvastatin (LIPITOR) 20 MG tablet Take 20 mg by mouth daily.     . B Complex Vitamins (VITAMIN B COMPLEX PO) Take 1 tablet by mouth daily.    . Cholecalciferol (VITAMIN D PO) Take 1,000 Units by mouth daily.     Marland Kitchen ezetimibe (ZETIA) 10 MG tablet Take 10 mg by mouth daily.      Marland Kitchen gabapentin (NEURONTIN) 300 MG capsule Take 300 mg by mouth daily as needed.    Marland Kitchen HYDROcodone-acetaminophen (NORCO) 7.5-325 MG tablet Take 1 tablet by mouth every 6 (six) hours as needed for moderate pain. 30 tablet 0  . levothyroxine (SYNTHROID, LEVOTHROID) 50 MCG tablet Take 50 mcg by mouth daily before breakfast.    . quinapril (ACCUPRIL) 20 MG tablet Take 20 mg by mouth every morning.      No facility-administered medications prior to visit.  Allergies:   Cortisone   Social History   Socioeconomic History  . Marital status: Married    Spouse name: Not on file  . Number of children: 2  . Years of education: Not on file  . Highest education level: Not on file  Occupational History  . 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  [employer]  Social Needs  . Financial resource strain: Not on file  . Food insecurity:    Worry: Not on file    Inability: Not on file  . Transportation needs:    Medical: Not on file    Non-medical: Not on file  Tobacco Use  . Smoking status: Former Smoker    Packs/day: 2.00    Years: 5.00    Pack years: 10.00    Last attempt to quit: 10/04/1967    Years since quitting: 51.1  . Smokeless tobacco: Never Used  Substance and Sexual Activity  . Alcohol use: Yes    Alcohol/week: 7.0 standard drinks    Types: 7 Shots of liquor per  week  . Drug use: No  . Sexual activity: Not on file  Lifestyle  . Physical activity:    Days per week: Not on file    Minutes per session: Not on file  . Stress: Not on file  Relationships  . Social connections:    Talks on phone: Not on file    Gets together: Not on file    Attends religious service: Not on file    Active member of club or organization: Not on file    Attends meetings of clubs or organizations: Not on file    Relationship status: Not on file  Other Topics Concern  . Not on file  Social History Narrative  . Not on file     Family History:  The patient's family history includes Cancer in his sister; Diabetes in his father; Lung cancer (age of onset: 70) in his father.   ROS:   Please see the history of present illness.    ROS All other systems reviewed and are negative.   PHYSICAL EXAM:   VS:  BP 110/66   Pulse (!) 57   Ht 5\' 10"  (1.778 m)   Wt 160 lb (72.6 kg)   BMI 22.96 kg/m    GENERAL:  Well appearing WM in NADHEENT:  PERRL, EOMI, sclera are clear. Oropharynx is clear. NECK:  No jugular venous distention, carotid upstroke brisk and symmetric, no bruits, no thyromegaly or adenopathy LUNGS:  Clear to auscultation bilaterally CHEST:  Unremarkable HEART:  RRR,  PMI not displaced or sustained,S1 and S2 within normal limits, no S3, no S4: no clicks, no rubs, no murmurs ABD:  Soft, nontender. BS +, no masses or bruits. No hepatomegaly, no splenomegaly EXT:  2 + pulses throughout, no edema, no cyanosis no clubbing SKIN:  Warm and dry.  No rashes NEURO:  Alert and oriented x 3. Cranial nerves II through XII intact. PSYCH:  Cognitively intact   Wt Readings from Last 3 Encounters:  11/15/18 160 lb (72.6 kg)  03/12/18 161 lb 12.8 oz (73.4 kg)  11/15/17 161 lb (73 kg)      Studies/Labs Reviewed:   EKG:  EKG is ordered today.  The ekg ordered today demonstrates NSR with rate 57. LAFB. RBBB. LVH. Otherwise normal. I have personally reviewed and  interpreted this study.   Labs dated 03/09/16: cholesterol 129, triglycerides 148, HDL 50, LDL 49.  Dated  10/04/16: creatinine 1.5. Other chemistries normal.  Dated 05/17/17: cholesterol 134, triglycerides 150, HDL 48, LDL 56. CBC, CMET, TSH normal.  Dated 03/01/18: cholesterol 149, triglycerides 82, HDL 56, LDL 77. CBC, CMET and TFTs normal.  Recent Labs: No results found for requested labs within last 8760 hours.   Lipid Panel No results found for: CHOL, TRIG, HDL, CHOLHDL, VLDL, LDLCALC, LDLDIRECT  Additional studies/ records that were reviewed today include:  Echo 03/26/13:- Left ventricle: The cavity size was normal. Wall thickness was normal. Systolic function was mildly reduced. The estimated ejection fraction was in the range of 45% to 50%. There is hypokinesis of the basal-mid inferior posterior myocardium. Doppler parameters are consistent with abnormal left ventricular relaxation (grade 1 diastolic dysfunction). - Aortic valve: Mild regurgitation. - Mitral valve: Mild regurgitation. - Pulmonary arteries: PA peak pressure: 71mm Hg (S).  ASSESSMENT:    1. Coronary artery disease involving coronary bypass graft of native heart without angina pectoris   2. Essential hypertension   3. Pure hypercholesterolemia      PLAN:  In order of problems listed above:  1. Patient continues to do very well now 25 years post CABG with RIMA and LIMA grafts. He is asymptomatic. Recommend continued medical therapy with risk factor modification.  2. BP is under excellent control. Continue Norvasc, atenolol, accupril. 3. On statin therapy with good control  I will follow up in one year.    Medication Adjustments/Labs and Tests Ordered: Current medicines are reviewed at length with the patient today.  Concerns regarding medicines are outlined above.  Medication changes, Labs and Tests ordered today are listed in the Patient Instructions below. There are no Patient Instructions  on file for this visit.   Signed,  [place], MD  11/15/2018 10:09 AM    [facility] [address] [place], [place], [address] [phone]

## 2018-11-15 ENCOUNTER — Encounter: Payer: Self-pay | Admitting: Cardiology

## 2018-11-15 ENCOUNTER — Ambulatory Visit: Payer: Medicare Other | Admitting: Cardiology

## 2018-11-15 VITALS — BP 110/66 | HR 57 | Ht 70.0 in | Wt 160.0 lb

## 2018-11-15 DIAGNOSIS — I25810 Atherosclerosis of coronary artery bypass graft(s) without angina pectoris: Secondary | ICD-10-CM

## 2018-11-15 DIAGNOSIS — E7800 Pure hypercholesterolemia, unspecified: Secondary | ICD-10-CM

## 2018-11-15 DIAGNOSIS — I10 Essential (primary) hypertension: Secondary | ICD-10-CM

## 2019-02-27 ENCOUNTER — Telehealth: Payer: Self-pay

## 2019-02-27 NOTE — Telephone Encounter (Signed)
It's similar to prior scanned report from last year CBC with Diff CMP Myeloma panel Free light chains

## 2019-02-27 NOTE — Telephone Encounter (Signed)
Dr. Raul Del office called and left message. He has labs scheduled at there office on 6/3. What labs would you like them to get to save him a trip?

## 2019-02-27 NOTE — Telephone Encounter (Signed)
Called and given below message to [facility]. They will draw labs and send results to office.

## 2019-03-06 ENCOUNTER — Other Ambulatory Visit: Payer: Self-pay | Admitting: Hematology and Oncology

## 2019-03-06 ENCOUNTER — Telehealth: Payer: Self-pay | Admitting: *Deleted

## 2019-03-06 DIAGNOSIS — D472 Monoclonal gammopathy: Secondary | ICD-10-CM

## 2019-03-06 DIAGNOSIS — E7800 Pure hypercholesterolemia, unspecified: Secondary | ICD-10-CM

## 2019-03-06 DIAGNOSIS — R5383 Other fatigue: Secondary | ICD-10-CM | POA: Insufficient documentation

## 2019-03-06 NOTE — Telephone Encounter (Signed)
[facility] called to request labs be added to his appointment on 6/18. They need TSH and Free T4 drawn along with a lipid panel. Patient will get a copy of results at his visit with Dr. Alvy Bimler on 6/25 and deliver them when he goes for his PCP appointment on July 22.  Dr. Alvy Bimler can we order these labs for him?

## 2019-03-06 NOTE — Telephone Encounter (Signed)
1) I suggest moving his lab appt to earlier since he has to fast for lipid. I ordered them 2) can he come in sooner on his appt on 6/25? If he insist on later, I can see him later on 6/29

## 2019-03-06 NOTE — Telephone Encounter (Signed)
Telephone call to patient- he agrees to come in earlier for both appointments.

## 2019-03-20 ENCOUNTER — Encounter: Payer: Self-pay | Admitting: Gastroenterology

## 2019-03-21 ENCOUNTER — Other Ambulatory Visit: Payer: Self-pay

## 2019-03-21 ENCOUNTER — Inpatient Hospital Stay: Payer: Medicare Other | Attending: Hematology and Oncology

## 2019-03-21 DIAGNOSIS — E7800 Pure hypercholesterolemia, unspecified: Secondary | ICD-10-CM

## 2019-03-21 DIAGNOSIS — R5383 Other fatigue: Secondary | ICD-10-CM

## 2019-03-21 DIAGNOSIS — D472 Monoclonal gammopathy: Secondary | ICD-10-CM

## 2019-03-21 DIAGNOSIS — D6489 Other specified anemias: Secondary | ICD-10-CM | POA: Insufficient documentation

## 2019-03-21 DIAGNOSIS — G621 Alcoholic polyneuropathy: Secondary | ICD-10-CM | POA: Diagnosis not present

## 2019-03-21 DIAGNOSIS — Z7982 Long term (current) use of aspirin: Secondary | ICD-10-CM | POA: Insufficient documentation

## 2019-03-21 DIAGNOSIS — Z79899 Other long term (current) drug therapy: Secondary | ICD-10-CM | POA: Diagnosis not present

## 2019-03-21 LAB — CBC WITH DIFFERENTIAL/PLATELET
Abs Immature Granulocytes: 0.03 10*3/uL (ref 0.00–0.07)
Basophils Absolute: 0 10*3/uL (ref 0.0–0.1)
Basophils Relative: 0 %
Eosinophils Absolute: 0.2 10*3/uL (ref 0.0–0.5)
Eosinophils Relative: 4 %
HCT: 37 % — ABNORMAL LOW (ref 39.0–52.0)
Hemoglobin: 12.2 g/dL — ABNORMAL LOW (ref 13.0–17.0)
Immature Granulocytes: 0 %
Lymphocytes Relative: 29 %
Lymphs Abs: 2 10*3/uL (ref 0.7–4.0)
MCH: 31 pg (ref 26.0–34.0)
MCHC: 33 g/dL (ref 30.0–36.0)
MCV: 93.9 fL (ref 80.0–100.0)
Monocytes Absolute: 0.7 10*3/uL (ref 0.1–1.0)
Monocytes Relative: 10 %
Neutro Abs: 3.9 10*3/uL (ref 1.7–7.7)
Neutrophils Relative %: 57 %
Platelets: 259 10*3/uL (ref 150–400)
RBC: 3.94 MIL/uL — ABNORMAL LOW (ref 4.22–5.81)
RDW: 12.8 % (ref 11.5–15.5)
WBC: 6.8 10*3/uL (ref 4.0–10.5)
nRBC: 0 % (ref 0.0–0.2)

## 2019-03-21 LAB — COMPREHENSIVE METABOLIC PANEL
ALT: 21 U/L (ref 0–44)
AST: 18 U/L (ref 15–41)
Albumin: 4 g/dL (ref 3.5–5.0)
Alkaline Phosphatase: 54 U/L (ref 38–126)
Anion gap: 7 (ref 5–15)
BUN: 18 mg/dL (ref 8–23)
CO2: 28 mmol/L (ref 22–32)
Calcium: 9 mg/dL (ref 8.9–10.3)
Chloride: 103 mmol/L (ref 98–111)
Creatinine, Ser: 1.29 mg/dL — ABNORMAL HIGH (ref 0.61–1.24)
GFR calc Af Amer: >60 mL/min (ref >60)
GFR calc non Af Amer: 52 mL/min — ABNORMAL LOW (ref >60)
Glucose, Bld: 98 mg/dL (ref 70–99)
Potassium: 4.3 mmol/L (ref 3.5–5.1)
Sodium: 138 mmol/L (ref 135–145)
Total Bilirubin: 0.4 mg/dL (ref 0.3–1.2)
Total Protein: 7.3 g/dL (ref 6.5–8.1)

## 2019-03-21 LAB — LIPID PANEL
Cholesterol: 131 mg/dL (ref 0–200)
HDL: 43 mg/dL (ref >40)
LDL Cholesterol: 67 mg/dL (ref 0–99)
Total CHOL/HDL Ratio: 3 RATIO
Triglycerides: 103 mg/dL (ref <150)
VLDL: 21 mg/dL (ref 0–40)

## 2019-03-21 LAB — TSH: TSH: 3.814 u[IU]/mL (ref 0.320–4.118)

## 2019-03-21 LAB — T4, FREE: Free T4: 0.93 ng/dL (ref 0.61–1.12)

## 2019-03-22 LAB — BETA 2 MICROGLOBULIN, SERUM: Beta-2 Microglobulin: 2.5 mg/L — ABNORMAL HIGH (ref 0.6–2.4)

## 2019-03-22 LAB — KAPPA/LAMBDA LIGHT CHAINS
Kappa free light chain: 52.4 mg/L — ABNORMAL HIGH (ref 3.3–19.4)
Kappa, lambda light chain ratio: 3.49 — ABNORMAL HIGH (ref 0.26–1.65)
Lambda free light chains: 15 mg/L (ref 5.7–26.3)

## 2019-03-25 LAB — MULTIPLE MYELOMA PANEL, SERUM
Albumin SerPl Elph-Mcnc: 3.7 g/dL (ref 2.9–4.4)
Albumin/Glob SerPl: 1.2 (ref 0.7–1.7)
Alpha 1: 0.2 g/dL (ref 0.0–0.4)
Alpha2 Glob SerPl Elph-Mcnc: 0.9 g/dL (ref 0.4–1.0)
B-Globulin SerPl Elph-Mcnc: 0.9 g/dL (ref 0.7–1.3)
Gamma Glob SerPl Elph-Mcnc: 1.1 g/dL (ref 0.4–1.8)
Globulin, Total: 3.1 g/dL (ref 2.2–3.9)
IgA: 222 mg/dL (ref 61–437)
IgG (Immunoglobin G), Serum: 1104 mg/dL (ref 603–1613)
IgM (Immunoglobulin M), Srm: 21 mg/dL (ref 15–143)
M Protein SerPl Elph-Mcnc: 0.6 g/dL — ABNORMAL HIGH
Total Protein ELP: 6.8 g/dL (ref 6.0–8.5)

## 2019-03-28 ENCOUNTER — Other Ambulatory Visit: Payer: Self-pay

## 2019-03-28 ENCOUNTER — Inpatient Hospital Stay (HOSPITAL_BASED_OUTPATIENT_CLINIC_OR_DEPARTMENT_OTHER): Payer: Medicare Other | Admitting: Hematology and Oncology

## 2019-03-28 DIAGNOSIS — G621 Alcoholic polyneuropathy: Secondary | ICD-10-CM

## 2019-03-28 DIAGNOSIS — Z79899 Other long term (current) drug therapy: Secondary | ICD-10-CM

## 2019-03-28 DIAGNOSIS — D472 Monoclonal gammopathy: Secondary | ICD-10-CM

## 2019-03-28 DIAGNOSIS — D6489 Other specified anemias: Secondary | ICD-10-CM

## 2019-03-28 DIAGNOSIS — D638 Anemia in other chronic diseases classified elsewhere: Secondary | ICD-10-CM

## 2019-03-28 DIAGNOSIS — Z7982 Long term (current) use of aspirin: Secondary | ICD-10-CM | POA: Diagnosis not present

## 2019-03-29 ENCOUNTER — Encounter: Payer: Self-pay | Admitting: Hematology and Oncology

## 2019-03-29 ENCOUNTER — Telehealth: Payer: Self-pay | Admitting: Hematology and Oncology

## 2019-03-29 NOTE — Assessment & Plan Note (Signed)
He described 2 separate locations of neuropathy The neuropathy affecting the right thumb, second and third digit likely due to median nerve compression from degenerative arthritis versus carpal tunnel syndrome The neuropathy affecting his feet could be due to alcoholism The patient has been drinking scotch daily for many years; the patient felt that since he quit alcohol intake in the past year, his neuropathy is worse Serum vitamin B12 was within normal limits I recommend further follow-up with neurologist for further evaluation and management

## 2019-03-29 NOTE — Progress Notes (Signed)
[facility] OFFICE PROGRESS NOTE  Patient Care Team: Marton Redwood, MD as PCP - General (Internal Medicine) Inda Castle, MD (Inactive) as Attending Physician (Gastroenterology) Martinique, Peter M, MD as Attending Physician (Cardiology) Jarome Matin, MD as Consulting Physician (Dermatology) System, Provider Not In Heath Lark, MD as Consulting Physician (Hematology and Oncology)  ASSESSMENT & PLAN:  Monoclonal gammopathy I have review all the blood work with the patient and family There is no significant change on his recent myeloma panel I do not believe his peripheral neuropathy is caused by this I will continue to see him once a year He will continue to get blood work done through his primary care doctor's office next year  Alcoholic peripheral neuropathy ([place]) He described 2 separate locations of neuropathy The neuropathy affecting the right thumb, second and third digit likely due to median nerve compression from degenerative arthritis versus carpal tunnel syndrome The neuropathy affecting his feet could be due to alcoholism The patient has been drinking scotch daily for many years; the patient felt that since he quit alcohol intake in the past year, his neuropathy is worse Serum vitamin B12 was within normal limits I recommend further follow-up with neurologist for further evaluation and management  Anemia due to chronic illness This is likely anemia of chronic disease. The patient denies recent history of bleeding such as epistaxis, hematuria or hematochezia. He is asymptomatic from the anemia. We will observe for now.     No orders of the defined types were placed in this encounter.   INTERVAL HISTORY: Please see below for problem oriented charting. Returns for further follow-up and to discuss test results He has quit drinking and felt that his neuropathy is worse He has not undergone any kind of procedure for carpal tunnel release on his right hand No  recent infection, fever or chills No new bone pain  SUMMARY OF ONCOLOGIC HISTORY:  This is a pleasant gentleman who was found to have anemia and abnormal M spike. He had further workup including blood work, urine test, skeletal survey as well as bone marrow aspirate and biopsy. 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 He also has abnormal lung nodule, observed with serial imaging CT scan. Last CT scan of the chest dated June 2016, confirmed benign lung nodules with no further follow-up recommended  REVIEW OF SYSTEMS:   Constitutional: Denies fevers, chills or abnormal weight loss Eyes: Denies blurriness of vision Ears, nose, mouth, throat, and face: Denies mucositis or sore throat Respiratory: Denies cough, dyspnea or wheezes Cardiovascular: Denies palpitation, chest discomfort or lower extremity swelling Gastrointestinal:  Denies nausea, heartburn or change in bowel habits Skin: Denies abnormal skin rashes Lymphatics: Denies new lymphadenopathy or easy bruising Behavioral/Psych: Mood is stable, no new changes  All other systems were reviewed with the patient and are negative.  I have reviewed the past medical history, past surgical history, social history and family history with the patient and they are unchanged from previous note.  ALLERGIES:  is allergic to cortisone.  MEDICATIONS:  Current Outpatient Medications  Medication Sig Dispense Refill  . amLODipine (NORVASC) 2.5 MG tablet Take 2.5 mg by mouth daily.    . Ascorbic Acid (VITAMIN C) 100 MG tablet Take 100 mg by mouth 2 (two) times daily.     Marland Kitchen aspirin 81 MG tablet Take 81 mg by mouth daily.    Marland Kitchen atenolol (TENORMIN) 25 MG tablet Take 25 mg by mouth daily.     Marland Kitchen atorvastatin (  LIPITOR) 20 MG tablet Take 20 mg by mouth daily.     . B Complex Vitamins (VITAMIN B COMPLEX PO) Take 1 tablet by mouth daily.    . Cholecalciferol (VITAMIN D PO) Take 1,000 Units by mouth daily.     Marland Kitchen ezetimibe (ZETIA) 10 MG tablet Take 10 mg  by mouth daily.      Marland Kitchen gabapentin (NEURONTIN) 300 MG capsule Take 300 mg by mouth daily as needed.    Marland Kitchen HYDROcodone-acetaminophen (NORCO) 7.5-325 MG tablet Take 1 tablet by mouth every 6 (six) hours as needed for moderate pain. 30 tablet 0  . levothyroxine (SYNTHROID, LEVOTHROID) 50 MCG tablet Take 50 mcg by mouth daily before breakfast.    . quinapril (ACCUPRIL) 20 MG tablet Take 20 mg by mouth every morning.      No current facility-administered medications for this visit.     PHYSICAL EXAMINATION: ECOG PERFORMANCE STATUS: 1 - Symptomatic but completely ambulatory  Vitals:   03/28/19 1120  BP: 117/60  Pulse: 66  Resp: 18  Temp: 98.7 F (37.1 C)  SpO2: 98%   Filed Weights   03/28/19 1120  Weight: 157 lb 9.6 oz (71.5 kg)    GENERAL:alert, no distress and comfortable Musculoskeletal:no cyanosis of digits and no clubbing  NEURO: alert & oriented x 3 with fluent speech, no focal motor/sensory deficits  LABORATORY DATA:  I have reviewed the data as listed    Component Value Date/Time   NA 138 03/21/2019 1111   NA 135 (L) 03/23/2017 1316   K 4.3 03/21/2019 1111   K 4.5 03/23/2017 1316   CL 103 03/21/2019 1111   CL 94 (L) 03/20/2013 1510   CO2 28 03/21/2019 1111   CO2 26 03/23/2017 1316   GLUCOSE 98 03/21/2019 1111   GLUCOSE 103 03/23/2017 1316   GLUCOSE 122 (H) 03/20/2013 1510   BUN 18 03/21/2019 1111   BUN 17.8 03/23/2017 1316   CREATININE 1.29 (H) 03/21/2019 1111   CREATININE 1.3 03/23/2017 1316   CALCIUM 9.0 03/21/2019 1111   CALCIUM 9.6 03/23/2017 1316   PROT 7.3 03/21/2019 1111   PROT 6.5 03/23/2017 1317   PROT 6.8 03/23/2017 1316   ALBUMIN 4.0 03/21/2019 1111   ALBUMIN 3.8 03/23/2017 1316   AST 18 03/21/2019 1111   AST 29 03/23/2017 1316   ALT 21 03/21/2019 1111   ALT 28 03/23/2017 1316   ALKPHOS 54 03/21/2019 1111   ALKPHOS 59 03/23/2017 1316   BILITOT 0.4 03/21/2019 1111   BILITOT 1.00 03/23/2017 1316   GFRNONAA 52 (L) 03/21/2019 1111   GFRAA >60  03/21/2019 1111    No results found for: SPEP, UPEP  Lab Results  Component Value Date   WBC 6.8 03/21/2019   NEUTROABS 3.9 03/21/2019   HGB 12.2 (L) 03/21/2019   HCT 37.0 (L) 03/21/2019   MCV 93.9 03/21/2019   PLT 259 03/21/2019      Chemistry      Component Value Date/Time   NA 138 03/21/2019 1111   NA 135 (L) 03/23/2017 1316   K 4.3 03/21/2019 1111   K 4.5 03/23/2017 1316   CL 103 03/21/2019 1111   CL 94 (L) 03/20/2013 1510   CO2 28 03/21/2019 1111   CO2 26 03/23/2017 1316   BUN 18 03/21/2019 1111   BUN 17.8 03/23/2017 1316   CREATININE 1.29 (H) 03/21/2019 1111   CREATININE 1.3 03/23/2017 1316      Component Value Date/Time   CALCIUM 9.0 03/21/2019 1111  CALCIUM 9.6 03/23/2017 1316   ALKPHOS 54 03/21/2019 1111   ALKPHOS 59 03/23/2017 1316   AST 18 03/21/2019 1111   AST 29 03/23/2017 1316   ALT 21 03/21/2019 1111   ALT 28 03/23/2017 1316   BILITOT 0.4 03/21/2019 1111   BILITOT 1.00 03/23/2017 1316       All questions were answered. The patient knows to call the clinic with any problems, questions or concerns. No barriers to learning was detected.  I spent 15 minutes counseling the patient face to face. The total time spent in the appointment was 20 minutes and more than 50% was on counseling and review of test results  Heath Lark, MD 03/29/2019 7:15 AM

## 2019-03-29 NOTE — Telephone Encounter (Signed)
I talk with patients wife regarding schedule 

## 2019-03-29 NOTE — Assessment & Plan Note (Signed)
I have review all the blood work with the patient and family There is no significant change on his recent myeloma panel I do not believe his peripheral neuropathy is caused by this I will continue to see him once a year He will continue to get blood work done through his primary care doctor's office next year

## 2019-03-29 NOTE — Assessment & Plan Note (Signed)
This is likely anemia of chronic disease. The patient denies recent history of bleeding such as epistaxis, hematuria or hematochezia. He is asymptomatic from the anemia. We will observe for now.  

## 2019-04-02 ENCOUNTER — Other Ambulatory Visit: Payer: Self-pay

## 2019-04-02 ENCOUNTER — Encounter: Payer: Self-pay | Admitting: Orthopaedic Surgery

## 2019-04-02 ENCOUNTER — Ambulatory Visit (INDEPENDENT_AMBULATORY_CARE_PROVIDER_SITE_OTHER): Payer: Medicare Other | Admitting: Orthopaedic Surgery

## 2019-04-02 DIAGNOSIS — G8929 Other chronic pain: Secondary | ICD-10-CM | POA: Diagnosis not present

## 2019-04-02 DIAGNOSIS — M25512 Pain in left shoulder: Secondary | ICD-10-CM

## 2019-04-02 MED ORDER — LIDOCAINE HCL 1 % IJ SOLN
3.0000 mL | INTRAMUSCULAR | Status: AC | PRN
  Administered 2019-04-02: 10:00:00 3 mL

## 2019-04-02 MED ORDER — METHYLPREDNISOLONE ACETATE 40 MG/ML IJ SUSP
40.0000 mg | INTRAMUSCULAR | Status: AC | PRN
  Administered 2019-04-02: 40 mg via INTRA_ARTICULAR

## 2019-04-02 NOTE — Progress Notes (Signed)
Office Visit Note   Patient: Mitchell Fields           Date of Birth: January 24, 1936           MRN: [id number] Visit Date: 04/02/2019              Requested by: Marton Redwood, MD [address] [place],  [place] [address] PCP: Marton Redwood, MD   Assessment & Plan: Visit Diagnoses:  1. Chronic left shoulder pain     Plan: I do feel that is reasonable to try a steroid injection in his left shoulder as does he.  He understands the risk and benefits of this having had it done before.  He tolerated it well.  All question concerns were answered and addressed.  Follow-up is as needed.  Follow-Up Instructions: Return if symptoms worsen or fail to improve.   Orders:  Orders Placed This Encounter  Procedures  . Large Joint Inj   No orders of the defined types were placed in this encounter.     Procedures: Large Joint Inj: L subacromial bursa on 04/02/2019 10:24 AM Indications: pain and diagnostic evaluation Details: 22 G 1.5 in needle  Arthrogram: No  Medications: 3 mL lidocaine 1 %; 40 mg methylPREDNISolone acetate 40 MG/ML Outcome: tolerated well, no immediate complications Procedure, treatment alternatives, risks and benefits explained, specific risks discussed. Consent was given by the patient. Immediately prior to procedure a time out was called to verify the correct patient, procedure, equipment, support staff and site/side marked as required. Patient was prepped and draped in the usual sterile fashion.       Clinical Data: No additional findings.   Subjective: Chief Complaint  Patient presents with  . Left Shoulder - Follow-up  The patient is well-known to me.  He has significant left shoulder pain that is chronic with impingement syndrome.  He does get a steroid injection from time to time.  His last steroid injection in his left shoulder was in December of last year.  He is 83 years old and very active.  He would like to have a steroid injection again today in his left  shoulder.  He denies any active and acute changes in medical status.  He reports his left shoulder pain.  Denies any numbness and tingling in his hand or any neck issues.  HPI  Review of Systems He currently denies any headache, chest pain, shortness of breath, fever, chills, nausea, vomiting  Objective: Vital Signs: There were no vitals taken for this visit.  Physical Exam He is alert and orient x3 and in no acute distress Ortho Exam Examination of his left shoulder does show positive Neer and Hawkins signs with signs of impingement but good motion overall. Specialty Comments:  No specialty comments available.  Imaging: No results found.   PMFS History: Patient Active Problem List   Diagnosis Date Noted  . Other fatigue 03/06/2019  . Alcoholic peripheral neuropathy ([place]) 03/12/2018  . Sciatica, right side 10/11/2017  . Acute pain of right shoulder 03/02/2017  . Impingement syndrome of right shoulder 03/02/2017  . Anemia due to chronic illness 03/13/2014  . Monoclonal gammopathy   . Lung nodules 03/11/2013  . Abnormal weight loss 01/28/2013  . CAD (coronary artery disease) 08/03/2011  . HTN (hypertension) 08/03/2011  . Hyperlipidemia 08/03/2011  . Valvular heart disease 08/03/2011   Past Medical History:  Diagnosis Date  . Anemia   . Arthritis   . Back pain   . Bursitis of left hip   .  Coronary artery disease    TWO VESSEL BYPASS SURGERY in January 1995  . Diverticulosis   . Hyperlipidemia   . Hypertension   . Monoclonal gammopathy   . Neuropathy     Family History  Problem Relation Age of Onset  . Lung cancer Father 45       smoker  . Diabetes Father   . Cancer Sister        breast in one; "bone" cancer in other    Past Surgical History:  Procedure Laterality Date  . ADENOIDECTOMY    . [place], 2002   X3  . CORONARY ARTERY BYPASS GRAFT     X2. LIMA GRAFT AND A RIMA GRAFT.  Marland Kitchen INGUINAL HERNIA REPAIR Left   . Negative stress echo   08/11/2009  . US ECHOCARDIOGRAPHY  June 2012   Normal EF, mild aortic regurgitation, mild MR, LAE, mildly dilated RV, RAE, increased left atrial pressure, moderate to severe TR and mildly increased  PA systolic pressure.    Social History   Occupational History  . 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  [employer]  Tobacco Use  . Smoking status: Former Smoker    Packs/day: 2.00    Years: 5.00    Pack years: 10.00    Quit date: 10/04/1967    Years since quitting: 51.5  . Smokeless tobacco: Never Used  Substance and Sexual Activity  . Alcohol use: Yes    Alcohol/week: 7.0 standard drinks    Types: 7 Shots of liquor per week  . Drug use: No  . Sexual activity: Not on file

## 2019-07-10 ENCOUNTER — Ambulatory Visit (INDEPENDENT_AMBULATORY_CARE_PROVIDER_SITE_OTHER): Payer: Medicare Other

## 2019-07-10 ENCOUNTER — Encounter: Payer: Self-pay | Admitting: Podiatry

## 2019-07-10 ENCOUNTER — Other Ambulatory Visit: Payer: Self-pay

## 2019-07-10 ENCOUNTER — Ambulatory Visit: Payer: Medicare Other | Admitting: Podiatry

## 2019-07-10 VITALS — BP 131/68 | HR 74 | Resp 16

## 2019-07-10 DIAGNOSIS — T148XXA Other injury of unspecified body region, initial encounter: Secondary | ICD-10-CM

## 2019-07-10 DIAGNOSIS — M2041 Other hammer toe(s) (acquired), right foot: Secondary | ICD-10-CM

## 2019-07-10 DIAGNOSIS — M2042 Other hammer toe(s) (acquired), left foot: Secondary | ICD-10-CM

## 2019-07-10 NOTE — Progress Notes (Signed)
   Subjective:    Patient ID: Mitchell Fields, male    DOB: 1935-12-01, 83 y.o.   MRN: [id number]  HPI    Review of Systems  All other systems reviewed and are negative.      Objective:   Physical Exam        Assessment & Plan:

## 2019-07-10 NOTE — Patient Instructions (Signed)
Hammer Toe  Hammer toe is a change in the shape (a deformity) of your toe. The deformity causes the middle joint of your toe to stay bent. This causes pain, especially when you are wearing shoes. Hammer toe starts gradually. At first, the toe can be straightened. Gradually over time, the deformity becomes stiff and permanent. Early treatments to keep the toe straight may relieve pain. As the deformity becomes stiff and permanent, surgery may be needed to straighten the toe. What are the causes? Hammer toe is caused by abnormal bending of the toe joint that is closest to your foot. It happens gradually over time. This pulls on the muscles and connections (tendons) of the toe joint, making them weak and stiff. It is often related to wearing shoes that are too short or narrow and do not let your toes straighten. What increases the risk? You may be at greater risk for hammer toe if you:  Are male.  Are older.  Wear shoes that are too small.  Wear high-heeled shoes that pinch your toes.  Are a ballet dancer.  Have a second toe that is longer than your big toe (first toe).  Injure your foot or toe.  Have arthritis.  Have a family history of hammer toe.  Have a nerve or muscle disorder. What are the signs or symptoms? The main symptoms of this condition are pain and deformity of the toe. The pain is worse when wearing shoes, walking, or running. Other symptoms may include:  Corns or calluses over the bent part of the toe or between the toes.  Redness and a burning feeling on the toe.  An open sore that forms on the top of the toe.  Not being able to straighten the toe. How is this diagnosed? This condition is diagnosed based on your symptoms and a physical exam. During the exam, your health care provider will try to straighten your toe to see how stiff the deformity is. You may also have tests, such as:  A blood test to check for rheumatoid arthritis.  An X-ray to show how  severe the deformity is. How is this treated? Treatment for this condition will depend on how stiff the deformity is. Surgery is often needed. However, sometimes a hammer toe can be straightened without surgery. Treatments that do not involve surgery include:  Taping the toe into a straightened position.  Using pads and cushions to protect the toe (orthotics).  Wearing shoes that provide enough room for the toes.  Doing toe-stretching exercises at home.  Taking an NSAID to reduce pain and swelling. If these treatments do not help or the toe cannot be straightened, surgery is the next option. The most common surgeries used to straighten a hammer toe include:  Arthroplasty. In this procedure, part of the joint is removed, and that allows the toe to straighten.  Fusion. In this procedure, cartilage between the two bones of the joint is taken out and the bones are fused together into one longer bone.  Implantation. In this procedure, part of the bone is removed and replaced with an implant to let the toe move again.  Flexor tendon transfer. In this procedure, the tendons that curl the toes down (flexor tendons) are repositioned. Follow these instructions at home:  Take over-the-counter and prescription medicines only as told by your health care provider.  Do toe straightening and stretching exercises as told by your health care provider.  Keep all follow-up visits as told by your health care   provider. This is important. How is this prevented?  Wear shoes that give your toes enough room and do not cause pain.  Do not wear high-heeled shoes. Contact a health care provider if:  Your pain gets worse.  Your toe becomes red or swollen.  You develop an open sore on your toe. This information is not intended to replace advice given to you by your health care provider. Make sure you discuss any questions you have with your health care provider. Document Released: 09/16/2000 Document  Revised: 09/01/2017 Document Reviewed: 01/13/2016 Elsevier Patient Education  2020 Elsevier Inc.  

## 2019-07-14 NOTE — Progress Notes (Signed)
Subjective:   Patient ID: Mitchell Fields, male   DOB: 83 y.o.   MRN: [id number]   HPI Patient presents stating he has trouble with his toes on his left foot with a history of neuropathy and also on his right the front part of the foot is bruised and is worried about bone injury but does not remember specific injury.  Patient does not smoke likes to be active   Review of Systems  All other systems reviewed and are negative.       Objective:  Physical Exam Vitals signs and nursing note reviewed.  Constitutional:      Appearance: He is well-developed.  Pulmonary:     Effort: Pulmonary effort is normal.  Musculoskeletal: Normal range of motion.  Skin:    General: Skin is warm.  Neurological:     Mental Status: He is alert.     Neurovascular status found to be intact muscle strength was found to be adequate with range of motion within normal limits.  Patient is noted to have moderate depression of the lesser digits on the left foot with elevation of the toes noted and on the right foot there is bruising with discoloration that is mildly tender when palpated with good digital perfusion noted bilateral.  Patient has diminishment of sharp dull vibratory      Assessment:  Mild to moderate neuropathic-like symptomatology bilateral with patient noted to have digital deformities left that are localized and on the right foot bruising which appears to be more localized     Plan:  Reviewed condition and recommended at this time H&P which was done and no previous or further treatment except for wider shoes and padding.  If any redness drainage breakdown wart occur he will be seen back immediately  X-rays indicated digital deformities left with moderate contraction and on the right no indication of fracture secondary to bruise pattern

## 2019-08-26 ENCOUNTER — Telehealth: Payer: Self-pay | Admitting: Orthopaedic Surgery

## 2019-08-26 NOTE — Telephone Encounter (Signed)
Patient called about a cortisone inj. Left vm for pt to call us back No answer continuous ringing

## 2019-09-11 ENCOUNTER — Ambulatory Visit: Payer: Medicare Other | Admitting: Orthopaedic Surgery

## 2019-09-16 ENCOUNTER — Ambulatory Visit: Payer: Medicare Other | Admitting: Orthopaedic Surgery

## 2019-09-16 ENCOUNTER — Other Ambulatory Visit: Payer: Self-pay

## 2019-09-16 ENCOUNTER — Encounter: Payer: Self-pay | Admitting: Orthopaedic Surgery

## 2019-09-16 ENCOUNTER — Ambulatory Visit: Payer: Self-pay

## 2019-09-16 DIAGNOSIS — M75102 Unspecified rotator cuff tear or rupture of left shoulder, not specified as traumatic: Secondary | ICD-10-CM | POA: Diagnosis not present

## 2019-09-16 DIAGNOSIS — M25512 Pain in left shoulder: Secondary | ICD-10-CM | POA: Diagnosis not present

## 2019-09-16 DIAGNOSIS — M12812 Other specific arthropathies, not elsewhere classified, left shoulder: Secondary | ICD-10-CM | POA: Diagnosis not present

## 2019-09-16 MED ORDER — METHYLPREDNISOLONE ACETATE 40 MG/ML IJ SUSP
40.0000 mg | INTRAMUSCULAR | Status: AC | PRN
  Administered 2019-09-16: 10:00:00 40 mg via INTRA_ARTICULAR

## 2019-09-16 MED ORDER — HYDROCODONE-ACETAMINOPHEN 5-325 MG PO TABS: 1.0000 | ORAL_TABLET | Freq: Four times a day (QID) | ORAL | 0 refills | Status: DC | PRN

## 2019-09-16 MED ORDER — LIDOCAINE HCL 1 % IJ SOLN
3.0000 mL | INTRAMUSCULAR | Status: AC | PRN
  Administered 2019-09-16: 3 mL

## 2019-09-16 NOTE — Progress Notes (Signed)
Office Visit Note   Patient: Mitchell Fields           Date of Birth: [birth year]           MRN: [id number] Visit Date: 09/16/2019              Requested by: Marton Redwood, MD [address] [place],  [place] [address] PCP: Marton Redwood, MD   Assessment & Plan: Visit Diagnoses:  1. Left shoulder pain, unspecified chronicity   2. Left rotator cuff tear arthropathy     Plan: I did agree with his wishes and provide a steroid injection in his left shoulder subacromial space.  I did show him his shoulder x-rays and described what rotator cuff arthropathy means.  He does wish to have a prescription for hydrocodone sent in his pharmacy and I told him that I would do so and to use it sparingly.  I can always refill this 1 more time if needed.  All question concerns were answered and addressed.  Follow-up is as needed.  Follow-Up Instructions: Return if symptoms worsen or fail to improve.   Orders:  Orders Placed This Encounter  Procedures  . Large Joint Inj  . XR Shoulder Left   Meds ordered this encounter  Medications  . HYDROcodone-acetaminophen (NORCO/VICODIN) 5-325 MG tablet    Sig: Take 1 tablet by mouth every 6 (six) hours as needed for moderate pain.    Dispense:  40 tablet    Refill:  0      Procedures: Large Joint Inj: L subacromial bursa on 09/16/2019 10:16 AM Indications: pain and diagnostic evaluation Details: 22 G 1.5 in needle  Arthrogram: No  Medications: 3 mL lidocaine 1 %; 40 mg methylPREDNISolone acetate 40 MG/ML Outcome: tolerated well, no immediate complications Procedure, treatment alternatives, risks and benefits explained, specific risks discussed. Consent was given by the patient. Immediately prior to procedure a time out was called to verify the correct patient, procedure, equipment, support staff and site/side marked as required. Patient was prepped and draped in the usual sterile fashion.       Clinical Data: No additional  findings.   Subjective: Chief Complaint  Patient presents with  . Left Shoulder - Pain  The patient comes in today with chronic left shoulder pain.  He says is coming out of place but as he talks to me it is not coming out of place he is having a lot of grinding with his shoulder and weakness when he reaches across and overhead.  He is 83 years old and very active.  He last had a shoulder injection in June of this year.  We have not x-rayed the shoulder before.  HPI  Review of Systems He currently denies any headache, chest pain, shortness of breath, fever, chills, nausea, vomiting  Objective: Vital Signs: There were no vitals taken for this visit.  Physical Exam He is alert and orient x3 and in no acute distress Ortho Exam Examination of his left shoulder does show weakness in the rotator cuff.  There is grinding at the glenohumeral joint.  I do feel that the humeral head is high riding and he likely has rotator cuff arthropathy. Specialty Comments:  No specialty comments available.  Imaging: XR Shoulder Left  Result Date: 09/16/2019 3 views left shoulder consistent with rotator cuff arthropathy.  The humeral head is high riding in the glenohumeral joint.    PMFS History: Patient Active Problem List   Diagnosis Date Noted  . Left rotator cuff  tear arthropathy 09/16/2019  . Other fatigue 03/06/2019  . Alcoholic peripheral neuropathy ([place]) 03/12/2018  . Sciatica, right side 10/11/2017  . Acute pain of right shoulder 03/02/2017  . Impingement syndrome of right shoulder 03/02/2017  . Anemia due to chronic illness 03/13/2014  . Monoclonal gammopathy   . Lung nodules 03/11/2013  . Abnormal weight loss 01/28/2013  . CAD (coronary artery disease) 08/03/2011  . HTN (hypertension) 08/03/2011  . Hyperlipidemia 08/03/2011  . Valvular heart disease 08/03/2011   Past Medical History:  Diagnosis Date  . Anemia   . Arthritis   . Back pain   . Bursitis of left hip   . Coronary  artery disease    TWO VESSEL BYPASS SURGERY in January 1995  . Diverticulosis   . Hyperlipidemia   . Hypertension   . Monoclonal gammopathy   . Neuropathy     Family History  Problem Relation Age of Onset  . Lung cancer Father 41       smoker  . Diabetes Father   . Cancer Sister        breast in one; "bone" cancer in other    Past Surgical History:  Procedure Laterality Date  . ADENOIDECTOMY    . [place], 2002   X3  . CORONARY ARTERY BYPASS GRAFT     X2. LIMA GRAFT AND A RIMA GRAFT.  Marland Kitchen INGUINAL HERNIA REPAIR Left   . Negative stress echo  08/11/2009  . US ECHOCARDIOGRAPHY  June 2012   Normal EF, mild aortic regurgitation, mild MR, LAE, mildly dilated RV, RAE, increased left atrial pressure, moderate to severe TR and mildly increased  PA systolic pressure.    Social History   Occupational History  . 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  [employer]  Tobacco Use  . Smoking status: Former Smoker    Packs/day: 2.00    Years: 5.00    Pack years: 10.00    Quit date: 10/04/1967    Years since quitting: 51.9  . Smokeless tobacco: Never Used  Substance and Sexual Activity  . Alcohol use: Yes    Alcohol/week: 7.0 standard drinks    Types: 7 Shots of liquor per week  . Drug use: No  . Sexual activity: Not on file

## 2019-12-23 NOTE — Progress Notes (Signed)
Cardiology Office Note    Date:  12/26/2019   ID:  Mitchell Fields, DOB [birth year], MRN [id number]  PCP:  Mitchell Redwood, MD  Cardiologist:  Mitchell Hoaglund [place], MD    History of Present Illness:  Mitchell Fields is a 84 y.o. male seen for follow up CAD. He has a known history of remote MI, remote CABG 1995 in [place], also has HTN and hyperlipidemia. He had an echo in June 2012 showing normal EF, mild aortic regurgitation, mild MR, mild to moderate LAE, mildly dilated RV, moderate RAE, moderate to severe TR and redundant atrial septum consistent with increased left atrial pressure. Echo in 2014 showed mild LV dysfunction with EF 45-50%. Mild AI and MR. CABG in 1995 included  LIMA and RIMA grafts. He has been diagnosed with monoclonal gammopathy and anemia of chronic disease.   On follow up today he reports he is doing very well from a cardiac standpoint. He states he does run out of gas easily. He has been fairly inactive this winter. Now trying to walk one mile a day and does some yard work. likes playing golf. He wants to get off some of his meds and has reduced his amlodipine to every other day. reduced lipitor to 10 mg daily and Zetia to every third day. He denies any chest pain, dyspnea, dizziness, edema.    Past Medical History:  Diagnosis Date  . Anemia   . Arthritis   . Back pain   . Bursitis of left hip   . Coronary artery disease    TWO VESSEL BYPASS SURGERY in January 1995  . Diverticulosis   . Hyperlipidemia   . Hypertension   . Monoclonal gammopathy   . Neuropathy     Past Surgical History:  Procedure Laterality Date  . ADENOIDECTOMY    . [place], 2002   X3  . CORONARY ARTERY BYPASS GRAFT     X2. LIMA GRAFT AND A RIMA GRAFT.  Marland Kitchen INGUINAL HERNIA REPAIR Left   . Negative stress echo  08/11/2009  . US ECHOCARDIOGRAPHY  June 2012   Normal EF, mild aortic regurgitation, mild MR, LAE, mildly dilated RV, RAE, increased left atrial pressure, moderate to severe  TR and mildly increased  PA systolic pressure.     Current Medications: Outpatient Medications Prior to Visit  Medication Sig Dispense Refill  . amLODipine (NORVASC) 2.5 MG tablet Take 2.5 mg by mouth daily.    . Ascorbic Acid (VITAMIN C) 100 MG tablet Take 100 mg by mouth 2 (two) times daily.     Marland Kitchen aspirin 81 MG tablet Take 81 mg by mouth daily.    Marland Kitchen atorvastatin (LIPITOR) 20 MG tablet Take 10 mg by mouth daily.     . B Complex Vitamins (VITAMIN B COMPLEX PO) Take 1 tablet by mouth daily.    . Cholecalciferol (VITAMIN D PO) Take 1,000 Units by mouth daily.     Marland Kitchen ezetimibe (ZETIA) 10 MG tablet Take 10 mg by mouth daily.      Marland Kitchen gabapentin (NEURONTIN) 300 MG capsule Take 300 mg by mouth daily as needed.    Marland Kitchen HYDROcodone-acetaminophen (NORCO/VICODIN) 5-325 MG tablet Take 1 tablet by mouth every 6 (six) hours as needed for moderate pain. 40 tablet 0  . LYRICA 75 MG capsule     . metoprolol succinate (TOPROL-XL) 25 MG 24 hr tablet     . quinapril (ACCUPRIL) 20 MG tablet Take 20 mg by mouth every morning.     Marland Kitchen  SYNTHROID 75 MCG tablet     . atenolol (TENORMIN) 25 MG tablet Take 25 mg by mouth daily.     Marland Kitchen levothyroxine (SYNTHROID, LEVOTHROID) 50 MCG tablet Take 50 mcg by mouth daily before breakfast.    . ofloxacin (OCUFLOX) 0.3 % ophthalmic solution     . prednisoLONE acetate (PRED FORTE) 1 % ophthalmic suspension      No facility-administered medications prior to visit.     Allergies:   Cortisone   Social History   Socioeconomic History  . Marital status: Married    Spouse name: Not on file  . Number of children: 2  . Years of education: Not on file  . Highest education level: Not on file  Occupational History  . 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  [employer]  Tobacco Use  . Smoking status: Former Smoker    Packs/day: 2.00    Years: 5.00    Pack years: 10.00    Quit date: 10/04/1967    Years since quitting: 52.2  . Smokeless tobacco: Never  Used  Substance and Sexual Activity  . Alcohol use: Yes    Alcohol/week: 7.0 standard drinks    Types: 7 Shots of liquor per week  . Drug use: No  . Sexual activity: Not on file  Other Topics Concern  . Not on file  Social History Narrative  . Not on file   Social Determinants of Health   Financial Resource Strain:   . Difficulty of Paying Living Expenses:   Food Insecurity:   . Worried About Charity fundraiser in the Last Year:   . Arboriculturist in the Last Year:   Transportation Needs:   . Film/video editor (Medical):   Marland Kitchen Lack of Transportation (Non-Medical):   Physical Activity:   . Days of Exercise per Week:   . Minutes of Exercise per Session:   Stress:   . Feeling of Stress :   Social Connections:   . Frequency of Communication with Friends and Family:   . Frequency of Social Gatherings with Friends and Family:   . Attends Religious Services:   . Active Member of Clubs or Organizations:   . Attends Archivist Meetings:   Marland Kitchen Marital Status:      Family History:  The patient's family history includes Cancer in his sister; Diabetes in his father; Lung cancer (age of onset: 73) in his father.   ROS:   Please see the history of present illness.    ROS All other systems reviewed and are negative.   PHYSICAL EXAM:   VS:  BP 118/60   Pulse 62   Temp (!) 97 F (36.1 C)   Ht 5\' 10"  (1.778 m)   Wt 160 lb 6.4 oz (72.8 kg)   SpO2 96%   BMI 23.02 kg/m    GENERAL:  Well appearing WM in NAD HEENT:  PERRL, EOMI, sclera are clear. Oropharynx is clear. NECK:  No jugular venous distention, carotid upstroke brisk and symmetric, no bruits, no thyromegaly or adenopathy LUNGS:  Clear to auscultation bilaterally CHEST:  Unremarkable HEART:  RRR,  PMI not displaced or sustained,S1 and S2 within normal limits, no S3, no S4: no clicks, no rubs, no murmurs ABD:  Soft, nontender. BS +, no masses or bruits. No hepatomegaly, no splenomegaly EXT:  2 + pulses  throughout, no edema, no cyanosis no clubbing SKIN:  Warm and dry.  No rashes NEURO:  Alert and oriented x 3. Cranial nerves II through XII intact. PSYCH:  Cognitively intact   Wt Readings from Last 3 Encounters:  12/26/19 160 lb 6.4 oz (72.8 kg)  03/28/19 157 lb 9.6 oz (71.5 kg)  11/15/18 160 lb (72.6 kg)      Studies/Labs Reviewed:   EKG:  EKG is ordered today.  The ekg ordered today demonstrates NSR with rate 62. LAFB. RBBB. LVH. Otherwise normal. I have personally reviewed and interpreted this study.   Labs dated 03/09/16: cholesterol 129, triglycerides 148, HDL 50, LDL 49.  Dated 10/04/16: creatinine 1.5. Other chemistries normal.  Dated 05/17/17: cholesterol 134, triglycerides 150, HDL 48, LDL 56. CBC, CMET, TSH normal.  Dated 03/01/18: cholesterol 149, triglycerides 82, HDL 56, LDL 77. CBC, CMET and TFTs normal. Dated 04/17/19: cholesterol 135, triglycerides 119, HDL 40, LDL 71.   Recent Labs: 03/21/2019: ALT 21; BUN 18; Creatinine, Ser 1.29; Hemoglobin 12.2; Platelets 259; Potassium 4.3; Sodium 138; TSH 3.814   Lipid Panel    Component Value Date/Time   CHOL 131 03/21/2019 1111   TRIG 103 03/21/2019 1111   HDL 43 03/21/2019 1111   CHOLHDL 3.0 03/21/2019 1111   VLDL 21 03/21/2019 1111   LDLCALC 67 03/21/2019 1111    Additional studies/ records that were reviewed today include:  Echo 03/26/13:- Left ventricle: The cavity size was normal. Wall thickness was normal. Systolic function was mildly reduced. The estimated ejection fraction was in the range of 45% to 50%. There is hypokinesis of the basal-mid inferior posterior myocardium. Doppler parameters are consistent with abnormal left ventricular relaxation (grade 1 diastolic dysfunction). - Aortic valve: Mild regurgitation. - Mitral valve: Mild regurgitation. - Pulmonary arteries: PA peak pressure: 55mm Hg (S).  ASSESSMENT:    1. Coronary artery disease involving coronary bypass graft of native heart  without angina pectoris   2. Essential hypertension   3. Pure hypercholesterolemia      PLAN:  In order of problems listed above:  1. Patient continues to do very well now 25 years post CABG with RIMA and LIMA grafts. He is asymptomatic. Recommend continued medical therapy with risk factor modification.  2. BP is under excellent control. I think it is Fields to try and come off amlodipine as long as BP is controlled. Still on  ACEi and Toprol. 3. On statin therapy with good control. I expect that his LDL will rise with reduction in meds but he wants to try it and we will wait and see what labs show this summer. If it does goes higher he is willing to resume.   I will follow up in one year.    Medication Adjustments/Labs and Tests Ordered: Current medicines are reviewed at length with the patient today.  Concerns regarding medicines are outlined above.  Medication changes, Labs and Tests ordered today are listed in the Patient Instructions below. There are no Patient Instructions on file for this visit.   Signed, Tamer Baughman Martin[place], MD  12/26/2019 4:43 PM    [facility] [address] [place], [place], [address] [phone]

## 2019-12-26 ENCOUNTER — Encounter: Payer: Self-pay | Admitting: Cardiology

## 2019-12-26 ENCOUNTER — Other Ambulatory Visit: Payer: Self-pay

## 2019-12-26 ENCOUNTER — Ambulatory Visit: Payer: Medicare Other | Admitting: Cardiology

## 2019-12-26 VITALS — BP 118/60 | HR 62 | Temp 97.0°F | Ht 70.0 in | Wt 160.4 lb

## 2019-12-26 DIAGNOSIS — I25810 Atherosclerosis of coronary artery bypass graft(s) without angina pectoris: Secondary | ICD-10-CM

## 2019-12-26 DIAGNOSIS — E7800 Pure hypercholesterolemia, unspecified: Secondary | ICD-10-CM | POA: Diagnosis not present

## 2019-12-26 DIAGNOSIS — I10 Essential (primary) hypertension: Secondary | ICD-10-CM

## 2020-02-18 ENCOUNTER — Ambulatory Visit: Payer: Medicare Other | Admitting: Orthopaedic Surgery

## 2020-02-18 ENCOUNTER — Encounter: Payer: Self-pay | Admitting: Orthopaedic Surgery

## 2020-02-18 ENCOUNTER — Other Ambulatory Visit: Payer: Self-pay

## 2020-02-18 DIAGNOSIS — M7061 Trochanteric bursitis, right hip: Secondary | ICD-10-CM

## 2020-02-18 MED ORDER — HYDROCODONE-ACETAMINOPHEN 5-325 MG PO TABS: 1.0000 | ORAL_TABLET | Freq: Four times a day (QID) | ORAL | 0 refills | Status: DC | PRN

## 2020-02-18 MED ORDER — METHYLPREDNISOLONE ACETATE 40 MG/ML IJ SUSP
40.0000 mg | INTRAMUSCULAR | Status: AC | PRN
  Administered 2020-02-18: 40 mg via INTRA_ARTICULAR

## 2020-02-18 MED ORDER — LIDOCAINE HCL 1 % IJ SOLN
3.0000 mL | INTRAMUSCULAR | Status: AC | PRN
  Administered 2020-02-18: 3 mL

## 2020-02-18 NOTE — Progress Notes (Signed)
Office Visit Note   Patient: Mitchell Fields           Date of Birth: 02/12/1936           MRN: [id number] Visit Date: 02/18/2020              Requested by: Marton Redwood, MD [address] [place],  [place] [address] PCP: Marton Redwood, MD   Assessment & Plan: Visit Diagnoses:  1. Trochanteric bursitis, right hip     Plan: Per the patient's request I did provide a steroid over the right hip trochanteric area.  I will also refill his hydrocodone.  All question concerns were answered and addressed.  Follow-up as otherwise as needed.  Follow-Up Instructions: Return if symptoms worsen or fail to improve.   Orders:  Orders Placed This Encounter  Procedures  . Large Joint Inj   Meds ordered this encounter  Medications  . HYDROcodone-acetaminophen (NORCO/VICODIN) 5-325 MG tablet    Sig: Take 1 tablet by mouth every 6 (six) hours as needed for moderate pain.    Dispense:  30 tablet    Refill:  0      Procedures: Large Joint Inj: R greater trochanter on 02/18/2020 10:28 AM Indications: pain and diagnostic evaluation Details: 22 G 1.5 in needle, lateral approach  Arthrogram: No  Medications: 3 mL lidocaine 1 %; 40 mg methylPREDNISolone acetate 40 MG/ML Outcome: tolerated well, no immediate complications Procedure, treatment alternatives, risks and benefits explained, specific risks discussed. Consent was given by the patient. Immediately prior to procedure a time out was called to verify the correct patient, procedure, equipment, support staff and site/side marked as required. Patient was prepped and draped in the usual sterile fashion.       Clinical Data: No additional findings.   Subjective: Chief Complaint  Patient presents with  . Right Hip - Pain  The patient is well-known to me.  He comes in with right hip trochanteric bursitis and is requesting an injection today.  It is appropriate for him to have this.  He has had a before and has been a long period of time.   He is also requesting a refill of hydrocodone.  We have not refilled in a long period time.  He was hoping to have more pills than just 30 but he understands that narcotics laws limit that.  He has had no other acute changes in medical status.  He says his hip pops quite a bit but is over the trochanteric area is where he hurts.  He is not a diabetic.  HPI  Review of Systems He currently denies any headache, chest pain, shortness of breath, fever, chills, nausea, vomiting  Objective: Vital Signs: There were no vitals taken for this visit.  Physical Exam He is alert and orient x3 and in no acute distress Ortho Exam Examination of his right hip shows pain only over the trochanteric area.  It moves smoothly otherwise. Specialty Comments:  No specialty comments available.  Imaging: No results found.   PMFS History: Patient Active Problem List   Diagnosis Date Noted  . Left rotator cuff tear arthropathy 09/16/2019  . Other fatigue 03/06/2019  . Alcoholic peripheral neuropathy ([place]) 03/12/2018  . Sciatica, right side 10/11/2017  . Acute pain of right shoulder 03/02/2017  . Impingement syndrome of right shoulder 03/02/2017  . Anemia due to chronic illness 03/13/2014  . Monoclonal gammopathy   . Lung nodules 03/11/2013  . Abnormal weight loss 01/28/2013  . CAD (coronary artery  disease) 08/03/2011  . HTN (hypertension) 08/03/2011  . Hyperlipidemia 08/03/2011  . Valvular heart disease 08/03/2011   Past Medical History:  Diagnosis Date  . Anemia   . Arthritis   . Back pain   . Bursitis of left hip   . Coronary artery disease    TWO VESSEL BYPASS SURGERY in January 1995  . Diverticulosis   . Hyperlipidemia   . Hypertension   . Monoclonal gammopathy   . Neuropathy     Family History  Problem Relation Age of Onset  . Lung cancer Father 47       smoker  . Diabetes Father   . Cancer Sister        breast in one; "bone" cancer in other    Past Surgical History:  Procedure  Laterality Date  . ADENOIDECTOMY    . [place], 2002   X3  . CORONARY ARTERY BYPASS GRAFT     X2. LIMA GRAFT AND A RIMA GRAFT.  Marland Kitchen INGUINAL HERNIA REPAIR Left   . Negative stress echo  08/11/2009  . US ECHOCARDIOGRAPHY  June 2012   Normal EF, mild aortic regurgitation, mild MR, LAE, mildly dilated RV, RAE, increased left atrial pressure, moderate to severe TR and mildly increased  PA systolic pressure.    Social History   Occupational History  . 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  [employer]  Tobacco Use  . Smoking status: Former Smoker    Packs/day: 2.00    Years: 5.00    Pack years: 10.00    Quit date: 10/04/1967    Years since quitting: 52.4  . Smokeless tobacco: Never Used  Substance and Sexual Activity  . Alcohol use: Yes    Alcohol/week: 7.0 standard drinks    Types: 7 Shots of liquor per week  . Drug use: No  . Sexual activity: Not on file

## 2020-02-19 ENCOUNTER — Ambulatory Visit: Payer: Medicare Other | Admitting: Orthopaedic Surgery

## 2020-03-05 ENCOUNTER — Other Ambulatory Visit: Payer: Self-pay | Admitting: Hematology and Oncology

## 2020-03-05 ENCOUNTER — Telehealth: Payer: Self-pay

## 2020-03-05 DIAGNOSIS — D472 Monoclonal gammopathy: Secondary | ICD-10-CM

## 2020-03-05 NOTE — Telephone Encounter (Signed)
Called back and given below message. He verbalized understanding. Lab appt scheduled for 6/21, he is aware of time. PCP is not until later this summer.

## 2020-03-05 NOTE — Telephone Encounter (Signed)
In the past he has been getting labs with PCP to save money If he is not able to get it done there, schedule lab appt 1 week before appt to see me

## 2020-03-05 NOTE — Telephone Encounter (Signed)
He called and left a message regarding 6/28 appt with Dr. Alvy Bimler. Does he need a lab appt prior to appt?

## 2020-03-13 ENCOUNTER — Other Ambulatory Visit: Payer: Self-pay | Admitting: Hematology and Oncology

## 2020-03-13 ENCOUNTER — Telehealth: Payer: Self-pay

## 2020-03-13 DIAGNOSIS — E7800 Pure hypercholesterolemia, unspecified: Secondary | ICD-10-CM

## 2020-03-13 NOTE — Telephone Encounter (Signed)
Called and given below message. Moved appt to 1220. He is aware of time.  He ask if you could order Lipid panel with labs and save him a trip to see PCP?

## 2020-03-13 NOTE — Telephone Encounter (Signed)
-----   Message from Heath Lark, MD sent at 03/13/2020  8:22 AM EDT ----- Regarding: can you call and move his appt a bit later the day in the afternoon or later in the week, 6/28 is overbooked in the am

## 2020-03-13 NOTE — Telephone Encounter (Signed)
Ok I ordered it

## 2020-03-23 ENCOUNTER — Inpatient Hospital Stay: Payer: Medicare Other | Attending: Hematology and Oncology

## 2020-03-23 ENCOUNTER — Other Ambulatory Visit: Payer: Self-pay

## 2020-03-23 DIAGNOSIS — Z7982 Long term (current) use of aspirin: Secondary | ICD-10-CM | POA: Insufficient documentation

## 2020-03-23 DIAGNOSIS — D472 Monoclonal gammopathy: Secondary | ICD-10-CM | POA: Insufficient documentation

## 2020-03-23 DIAGNOSIS — E7800 Pure hypercholesterolemia, unspecified: Secondary | ICD-10-CM

## 2020-03-23 DIAGNOSIS — Z79899 Other long term (current) drug therapy: Secondary | ICD-10-CM | POA: Diagnosis not present

## 2020-03-23 DIAGNOSIS — D649 Anemia, unspecified: Secondary | ICD-10-CM | POA: Diagnosis not present

## 2020-03-23 DIAGNOSIS — I10 Essential (primary) hypertension: Secondary | ICD-10-CM | POA: Diagnosis not present

## 2020-03-23 LAB — CBC WITH DIFFERENTIAL/PLATELET
Abs Immature Granulocytes: 0.01 10*3/uL (ref 0.00–0.07)
Basophils Absolute: 0 10*3/uL (ref 0.0–0.1)
Basophils Relative: 0 %
Eosinophils Absolute: 0.1 10*3/uL (ref 0.0–0.5)
Eosinophils Relative: 2 %
HCT: 34.8 % — ABNORMAL LOW (ref 39.0–52.0)
Hemoglobin: 11.7 g/dL — ABNORMAL LOW (ref 13.0–17.0)
Immature Granulocytes: 0 %
Lymphocytes Relative: 25 %
Lymphs Abs: 1.9 10*3/uL (ref 0.7–4.0)
MCH: 31 pg (ref 26.0–34.0)
MCHC: 33.6 g/dL (ref 30.0–36.0)
MCV: 92.3 fL (ref 80.0–100.0)
Monocytes Absolute: 0.7 10*3/uL (ref 0.1–1.0)
Monocytes Relative: 10 %
Neutro Abs: 4.9 10*3/uL (ref 1.7–7.7)
Neutrophils Relative %: 63 %
Platelets: 232 10*3/uL (ref 150–400)
RBC: 3.77 MIL/uL — ABNORMAL LOW (ref 4.22–5.81)
RDW: 13.6 % (ref 11.5–15.5)
WBC: 7.7 10*3/uL (ref 4.0–10.5)
nRBC: 0 % (ref 0.0–0.2)

## 2020-03-23 LAB — LIPID PANEL
Cholesterol: 178 mg/dL (ref 0–200)
HDL: 54 mg/dL (ref >40)
LDL Cholesterol: 96 mg/dL (ref 0–99)
Total CHOL/HDL Ratio: 3.3 RATIO
Triglycerides: 142 mg/dL (ref <150)
VLDL: 28 mg/dL (ref 0–40)

## 2020-03-23 LAB — COMPREHENSIVE METABOLIC PANEL
ALT: 21 U/L (ref 0–44)
AST: 21 U/L (ref 15–41)
Albumin: 3.9 g/dL (ref 3.5–5.0)
Alkaline Phosphatase: 57 U/L (ref 38–126)
Anion gap: 8 (ref 5–15)
BUN: 20 mg/dL (ref 8–23)
CO2: 25 mmol/L (ref 22–32)
Calcium: 8.6 mg/dL — ABNORMAL LOW (ref 8.9–10.3)
Chloride: 106 mmol/L (ref 98–111)
Creatinine, Ser: 1.19 mg/dL (ref 0.61–1.24)
GFR calc Af Amer: >60 mL/min (ref >60)
GFR calc non Af Amer: 57 mL/min — ABNORMAL LOW (ref >60)
Glucose, Bld: 101 mg/dL — ABNORMAL HIGH (ref 70–99)
Potassium: 4.5 mmol/L (ref 3.5–5.1)
Sodium: 139 mmol/L (ref 135–145)
Total Bilirubin: 0.6 mg/dL (ref 0.3–1.2)
Total Protein: 7 g/dL (ref 6.5–8.1)

## 2020-03-24 LAB — KAPPA/LAMBDA LIGHT CHAINS
Kappa free light chain: 40.9 mg/L — ABNORMAL HIGH (ref 3.3–19.4)
Kappa, lambda light chain ratio: 2.71 — ABNORMAL HIGH (ref 0.26–1.65)
Lambda free light chains: 15.1 mg/L (ref 5.7–26.3)

## 2020-03-27 LAB — MULTIPLE MYELOMA PANEL, SERUM
Albumin SerPl Elph-Mcnc: 3.6 g/dL (ref 2.9–4.4)
Albumin/Glob SerPl: 1.3 (ref 0.7–1.7)
Alpha 1: 0.2 g/dL (ref 0.0–0.4)
Alpha2 Glob SerPl Elph-Mcnc: 0.8 g/dL (ref 0.4–1.0)
B-Globulin SerPl Elph-Mcnc: 0.9 g/dL (ref 0.7–1.3)
Gamma Glob SerPl Elph-Mcnc: 1 g/dL (ref 0.4–1.8)
Globulin, Total: 2.9 g/dL (ref 2.2–3.9)
IgA: 222 mg/dL (ref 61–437)
IgG (Immunoglobin G), Serum: 1011 mg/dL (ref 603–1613)
IgM (Immunoglobulin M), Srm: 15 mg/dL (ref 15–143)
M Protein SerPl Elph-Mcnc: 0.5 g/dL — ABNORMAL HIGH
Total Protein ELP: 6.5 g/dL (ref 6.0–8.5)

## 2020-03-30 ENCOUNTER — Ambulatory Visit: Payer: Medicare Other | Admitting: Hematology and Oncology

## 2020-03-30 ENCOUNTER — Inpatient Hospital Stay: Payer: Medicare Other | Admitting: Hematology and Oncology

## 2020-03-30 ENCOUNTER — Other Ambulatory Visit: Payer: Self-pay

## 2020-03-30 ENCOUNTER — Encounter: Payer: Self-pay | Admitting: Hematology and Oncology

## 2020-03-30 VITALS — BP 112/64 | HR 63 | Temp 97.7°F | Resp 18 | Ht 70.0 in | Wt 155.8 lb

## 2020-03-30 DIAGNOSIS — I10 Essential (primary) hypertension: Secondary | ICD-10-CM

## 2020-03-30 DIAGNOSIS — D472 Monoclonal gammopathy: Secondary | ICD-10-CM

## 2020-03-30 NOTE — Assessment & Plan Note (Signed)
I have review all the blood work with the patient  There is no significant change on his recent myeloma panel I do not believe his peripheral neuropathy is caused by this I will continue to see him once a year

## 2020-03-30 NOTE — Assessment & Plan Note (Signed)
He has successfully lost some weight through dietary modification I encouraged his efforts The patient feels healthier

## 2020-03-30 NOTE — Progress Notes (Signed)
[facility] OFFICE PROGRESS NOTE  Patient Care Team: Marton Redwood, MD as PCP - General (Internal Medicine) Inda Castle, MD (Inactive) as Attending Physician (Gastroenterology) Martinique, Peter M, MD as Attending Physician (Cardiology) Jarome Matin, MD as Consulting Physician (Dermatology) System, Provider Not In [place], MD as Consulting Physician (Hematology and Oncology)  ASSESSMENT & PLAN:  Monoclonal gammopathy I have review all the blood work with the patient  There is no significant change on his recent myeloma panel I do not believe his peripheral neuropathy is caused by this I will continue to see him once a year  HTN (hypertension) He has successfully lost some weight through dietary modification I encouraged his efforts The patient feels healthier   Orders Placed This Encounter  Procedures  . Comprehensive metabolic panel    Standing Status:   Standing    Number of Occurrences:   33    Standing Expiration Date:   03/30/2021  . CBC with Differential/Platelet    Standing Status:   Standing    Number of Occurrences:   22    Standing Expiration Date:   03/30/2021  . Kappa/lambda light chains    Standing Status:   Standing    Number of Occurrences:   22    Standing Expiration Date:   03/30/2021  . Multiple Myeloma Panel (SPEP&IFE w/QIG)    Standing Status:   Standing    Number of Occurrences:   22    Standing Expiration Date:   03/30/2021    All questions were answered. The patient knows to call the clinic with any problems, questions or concerns. The total time spent in the appointment was 15 minutes encounter with patients including review of chart and various tests results, discussions about plan of care and coordination of care plan   Heath Lark, MD 03/30/2020 2:46 PM  INTERVAL HISTORY: Please see below for problem oriented charting. He returns for further follow-up He has stopped taking 2 medications since he have successfully lost  weight He feels healthier He also cut back on his alcohol intake No recent infection, fever or chills  SUMMARY OF ONCOLOGIC HISTORY:  This is a pleasant gentleman who was found to have anemia and abnormal M spike. He had further workup including blood work, urine test, skeletal survey as well as bone marrow aspirate and biopsy. 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 He also has abnormal lung nodule, observed with serial imaging CT scan. Last CT scan of the chest dated June 2016, confirmed benign lung nodules with no further follow-up recommended  REVIEW OF SYSTEMS:   Constitutional: Denies fevers, chills or abnormal weight loss Eyes: Denies blurriness of vision Ears, nose, mouth, throat, and face: Denies mucositis or sore throat Respiratory: Denies cough, dyspnea or wheezes Cardiovascular: Denies palpitation, chest discomfort or lower extremity swelling Gastrointestinal:  Denies nausea, heartburn or change in bowel habits Skin: Denies abnormal skin rashes Lymphatics: Denies new lymphadenopathy or easy bruising Neurological:Denies numbness, tingling or new weaknesses Behavioral/Psych: Mood is stable, no new changes  All other systems were reviewed with the patient and are negative.  I have reviewed the past medical history, past surgical history, social history and family history with the patient and they are unchanged from previous note.  ALLERGIES:  is allergic to cortisone.  MEDICATIONS:  Current Outpatient Medications  Medication Sig Dispense Refill  . Ascorbic Acid (VITAMIN C) 100 MG tablet Take 100 mg by mouth 2 (two) times daily.     Marland Kitchen  aspirin 81 MG tablet Take 81 mg by mouth daily.    Marland Kitchen atorvastatin (LIPITOR) 20 MG tablet Take 10 mg by mouth daily.     . B Complex Vitamins (VITAMIN B COMPLEX PO) Take 1 tablet by mouth daily.    . Cholecalciferol (VITAMIN D PO) Take 1,000 Units by mouth daily.     Marland Kitchen gabapentin (NEURONTIN) 300 MG capsule Take 300 mg by mouth daily as  needed.    Marland Kitchen HYDROcodone-acetaminophen (NORCO/VICODIN) 5-325 MG tablet Take 1 tablet by mouth every 6 (six) hours as needed for moderate pain. 30 tablet 0  . LYRICA 75 MG capsule     . metoprolol succinate (TOPROL-XL) 25 MG 24 hr tablet     . quinapril (ACCUPRIL) 20 MG tablet Take 20 mg by mouth every morning.     Marland Kitchen SYNTHROID 75 MCG tablet      No current facility-administered medications for this visit.    PHYSICAL EXAMINATION: ECOG PERFORMANCE STATUS: 0 - Asymptomatic  Vitals:   03/30/20 1233  BP: 112/64  Pulse: 63  Resp: 18  Temp: 97.7 F (36.5 C)  SpO2: 94%   Filed Weights   03/30/20 1233  Weight: 155 lb 12.8 oz (70.7 kg)    GENERAL:alert, no distress and comfortable NEURO: alert & oriented x 3 with fluent speech, no focal motor/sensory deficits  LABORATORY DATA:  I have reviewed the data as listed    Component Value Date/Time   NA 139 03/23/2020 1333   NA 135 (L) 03/23/2017 1316   K 4.5 03/23/2020 1333   K 4.5 03/23/2017 1316   CL 106 03/23/2020 1333   CL 94 (L) 03/20/2013 1510   CO2 25 03/23/2020 1333   CO2 26 03/23/2017 1316   GLUCOSE 101 (H) 03/23/2020 1333   GLUCOSE 103 03/23/2017 1316   GLUCOSE 122 (H) 03/20/2013 1510   BUN 20 03/23/2020 1333   BUN 17.8 03/23/2017 1316   CREATININE 1.19 03/23/2020 1333   CREATININE 1.3 03/23/2017 1316   CALCIUM 8.6 (L) 03/23/2020 1333   CALCIUM 9.6 03/23/2017 1316   PROT 7.0 03/23/2020 1333   PROT 6.5 03/23/2017 1317   PROT 6.8 03/23/2017 1316   ALBUMIN 3.9 03/23/2020 1333   ALBUMIN 3.8 03/23/2017 1316   AST 21 03/23/2020 1333   AST 29 03/23/2017 1316   ALT 21 03/23/2020 1333   ALT 28 03/23/2017 1316   ALKPHOS 57 03/23/2020 1333   ALKPHOS 59 03/23/2017 1316   BILITOT 0.6 03/23/2020 1333   BILITOT 1.00 03/23/2017 1316   GFRNONAA 57 (L) 03/23/2020 1333   GFRAA >60 03/23/2020 1333    No results found for: SPEP, UPEP  Lab Results  Component Value Date   WBC 7.7 03/23/2020   NEUTROABS 4.9 03/23/2020   HGB  11.7 (L) 03/23/2020   HCT 34.8 (L) 03/23/2020   MCV 92.3 03/23/2020   PLT 232 03/23/2020      Chemistry      Component Value Date/Time   NA 139 03/23/2020 1333   NA 135 (L) 03/23/2017 1316   K 4.5 03/23/2020 1333   K 4.5 03/23/2017 1316   CL 106 03/23/2020 1333   CL 94 (L) 03/20/2013 1510   CO2 25 03/23/2020 1333   CO2 26 03/23/2017 1316   BUN 20 03/23/2020 1333   BUN 17.8 03/23/2017 1316   CREATININE 1.19 03/23/2020 1333   CREATININE 1.3 03/23/2017 1316      Component Value Date/Time   CALCIUM 8.6 (L) 03/23/2020 1333  CALCIUM 9.6 03/23/2017 1316   ALKPHOS 57 03/23/2020 1333   ALKPHOS 59 03/23/2017 1316   AST 21 03/23/2020 1333   AST 29 03/23/2017 1316   ALT 21 03/23/2020 1333   ALT 28 03/23/2017 1316   BILITOT 0.6 03/23/2020 1333   BILITOT 1.00 03/23/2017 1316

## 2020-06-10 ENCOUNTER — Encounter: Payer: Self-pay | Admitting: Orthopaedic Surgery

## 2020-06-10 ENCOUNTER — Ambulatory Visit: Payer: Medicare Other | Admitting: Orthopaedic Surgery

## 2020-06-10 DIAGNOSIS — M7061 Trochanteric bursitis, right hip: Secondary | ICD-10-CM | POA: Diagnosis not present

## 2020-06-10 MED ORDER — LIDOCAINE HCL 1 % IJ SOLN
3.0000 mL | INTRAMUSCULAR | Status: AC | PRN
  Administered 2020-06-10: 3 mL

## 2020-06-10 MED ORDER — METHYLPREDNISOLONE ACETATE 40 MG/ML IJ SUSP
40.0000 mg | INTRAMUSCULAR | Status: AC | PRN
  Administered 2020-06-10: 40 mg via INTRA_ARTICULAR

## 2020-06-10 NOTE — Progress Notes (Signed)
Office Visit Note   Patient: Mitchell Fields           Date of Birth: 1936-09-05           MRN: [id number] Visit Date: 06/10/2020              Requested by: Marton Redwood, MD [address] [place],  [place] [address] PCP: Marton Redwood, MD   Assessment & Plan: Visit Diagnoses:  1. Trochanteric bursitis, right hip     Plan: Per the patient's request I did place a steroid injection over the right hip trochanteric area which he tolerated well.  All questions and concerns were answered and addressed.  Follow-up will be as needed.  Follow-Up Instructions: Return if symptoms worsen or fail to improve.   Orders:  Orders Placed This Encounter  Procedures  . Large Joint Inj   No orders of the defined types were placed in this encounter.     Procedures: Large Joint Inj: R greater trochanter on 06/10/2020 1:02 PM Indications: pain and diagnostic evaluation Details: 22 G 1.5 in needle, lateral approach  Arthrogram: No  Medications: 3 mL lidocaine 1 %; 40 mg methylPREDNISolone acetate 40 MG/ML Outcome: tolerated well, no immediate complications Procedure, treatment alternatives, risks and benefits explained, specific risks discussed. Consent was given by the patient. Immediately prior to procedure a time out was called to verify the correct patient, procedure, equipment, support staff and site/side marked as required. Patient was prepped and draped in the usual sterile fashion.       Clinical Data: No additional findings.   Subjective: Chief Complaint  Patient presents with  . Right Hip - Pain  The patient comes in today requesting a steroid injection in his right hip trochanteric area.  He has had injections in the past.  He has had no acute change in medical status.  It has been sometime since we have injected him.  He is requesting this since they are getting ready to travel out of the country and will do a lot of hiking.  He is 84 years old.  He denies any acute medical  issues otherwise.  HPI  Review of Systems He currently denies any headache, chest pain, shortness of breath, fever, chills, nausea, vomiting  Objective: Vital Signs: There were no vitals taken for this visit.  Physical Exam He is alert and orient x3 and in no acute distress Ortho Exam Examination of his right hip shows pain of the trochanteric area and sciatic area but it does not radiate.  His range of motion of his hip is normal. Specialty Comments:  No specialty comments available.  Imaging: No results found.   PMFS History: Patient Active Problem List   Diagnosis Date Noted  . Left rotator cuff tear arthropathy 09/16/2019  . Other fatigue 03/06/2019  . Alcoholic peripheral neuropathy ([place]) 03/12/2018  . Sciatica, right side 10/11/2017  . Acute pain of right shoulder 03/02/2017  . Impingement syndrome of right shoulder 03/02/2017  . Anemia due to chronic illness 03/13/2014  . Monoclonal gammopathy   . Lung nodules 03/11/2013  . Abnormal weight loss 01/28/2013  . CAD (coronary artery disease) 08/03/2011  . HTN (hypertension) 08/03/2011  . Hyperlipidemia 08/03/2011  . Valvular heart disease 08/03/2011   Past Medical History:  Diagnosis Date  . Anemia   . Arthritis   . Back pain   . Bursitis of left hip   . Coronary artery disease    TWO VESSEL BYPASS SURGERY in January 1995  .  Diverticulosis   . Hyperlipidemia   . Hypertension   . Monoclonal gammopathy   . Neuropathy     Family History  Problem Relation Age of Onset  . Lung cancer Father 7       smoker  . Diabetes Father   . Cancer Sister        breast in one; "bone" cancer in other    Past Surgical History:  Procedure Laterality Date  . ADENOIDECTOMY    . [place], 2002   X3  . CORONARY ARTERY BYPASS GRAFT     X2. LIMA GRAFT AND A RIMA GRAFT.  Marland Kitchen INGUINAL HERNIA REPAIR Left   . Negative stress echo  08/11/2009  . US ECHOCARDIOGRAPHY  June 2012   Normal EF, mild aortic  regurgitation, mild MR, LAE, mildly dilated RV, RAE, increased left atrial pressure, moderate to severe TR and mildly increased  PA systolic pressure.    Social History   Occupational History  . 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  [employer]  Tobacco Use  . Smoking status: Former Smoker    Packs/day: 2.00    Years: 5.00    Pack years: 10.00    Quit date: 10/04/1967    Years since quitting: 52.7  . Smokeless tobacco: Never Used  Substance and Sexual Activity  . Alcohol use: Yes    Alcohol/week: 7.0 standard drinks    Types: 7 Shots of liquor per week  . Drug use: No  . Sexual activity: Not on file

## 2020-08-03 ENCOUNTER — Telehealth: Payer: Self-pay | Admitting: Cardiology

## 2020-08-03 NOTE — Telephone Encounter (Signed)
Left message for patient to call back  

## 2020-08-03 NOTE — Telephone Encounter (Signed)
Attempted to return call to patient but there was no answer and VM did not pick up.

## 2020-08-03 NOTE — Telephone Encounter (Signed)
See other phone note

## 2020-08-03 NOTE — Telephone Encounter (Signed)
Pt c/o Shortness Of Breath: STAT if SOB developed within the last 24 hours or pt is noticeably SOB on the phone  1. Are you currently SOB (can you hear that pt is SOB on the phone)? no  2. How long have you been experiencing SOB? About a month ago.  3. Are you SOB when sitting or when up moving around? Moving around. States when he goes walking in his neighborhood up the hill he notices he gets more SOB which is very unlike him.   4. Are you currently experiencing any other symptoms? Patient states that he is very tired and has lost weight in the past month. He states he used to weigh 153 and now weighs 148. He says this is unusual for him because he never gets below 150. He knows his heart is working at 45% efficacy and wants to know if this could be a cause for all of these new symptoms that have come about in the last month. States his BP is still normal. Please advise.

## 2020-08-03 NOTE — Telephone Encounter (Signed)
Patient was returning phone call 

## 2020-08-04 NOTE — Telephone Encounter (Signed)
Patient of Dr. Martinique last seen 12/2019 H/O CABG, last EF in 2014 45-50% Patient reports "substantial fatigue" He states his PCP did labs - iron low, low Na - been treated per patient Patient reports many times over the years his iron and Na have been low so this should not be causing his new onset fatigue/SOB Patient denies chest discomfort Patient reports SOB when doing activities - like walking up the hill in his neighborhood, was able to do this before He reports weight loss but no appetite loss  No MD or APP visits in the immediate future at [facility] Scheduled for OV with Eldred PA @ 1215pm on 08/05/20

## 2020-08-04 NOTE — Telephone Encounter (Signed)
Pt is returning call.  

## 2020-08-04 NOTE — Telephone Encounter (Signed)
Rescheduled patient for DOD spot on 08/07/20 with Dr. Margaretann Loveless as [name] PA has virtual clinic ONLY tomorrow. Patient agreeable to new appointment.

## 2020-08-05 ENCOUNTER — Ambulatory Visit: Payer: Medicare Other | Admitting: Physician Assistant

## 2020-08-07 ENCOUNTER — Ambulatory Visit: Payer: Medicare Other | Admitting: Internal Medicine

## 2020-08-07 ENCOUNTER — Encounter: Payer: Self-pay | Admitting: Internal Medicine

## 2020-08-07 ENCOUNTER — Other Ambulatory Visit: Payer: Self-pay

## 2020-08-07 VITALS — BP 100/60 | HR 66 | Ht 70.0 in | Wt 149.8 lb

## 2020-08-07 DIAGNOSIS — R5383 Other fatigue: Secondary | ICD-10-CM | POA: Diagnosis not present

## 2020-08-07 DIAGNOSIS — D472 Monoclonal gammopathy: Secondary | ICD-10-CM

## 2020-08-07 DIAGNOSIS — I25810 Atherosclerosis of coronary artery bypass graft(s) without angina pectoris: Secondary | ICD-10-CM

## 2020-08-07 NOTE — Progress Notes (Addendum)
Cardiology Office Note:    Date:  08/07/2020   ID:  Mitchell Fields, DOB Jan 24, 1936, MRN [id number]  PCP:  Mitchell Redwood, MD  Cardiologist:  No primary care provider on file.  Electrophysiologist:  None   Referring MD: Mitchell Redwood, MD   Chief Complaint/Reason for Referral: Fatigue, Hx of CAD s/p CABG   History of Present Illness:    Mitchell Fields is a 84 y.o. male who is a patient of my partner, Dr. Peter Fields. I am gladly seeing the patient for an acute care visit. Patient has a history of CABG in 1995, as well as HTN, HLD.  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  He presents for evaluation of fatigue.  His wife Mitchell Fields is present for the visit today and provides additional history.  He has had fatigue for several years and has been addressing chronic hyponatremia as well as iron deficiency anemia with primary care and hematology.  However he notes that over the last several months his fatigue has worsened without an explanation.  He categorically denies chest pain or shortness of breath with his usual activities.  He has no lower extremity swelling.  He does endorse a dry cough.  He has been on an ACE inhibitor for many years without issue.  He has had a normal TSH recently.  He denies snoring.  His last echocardiogram was in 2014, demonstrating a mildly reduced ejection fraction with regional wall motion abnormalities, grade 1 diastolic dysfunction, mild aortic valve regurgitation and mild mitral valve regurgitation.  He is noted to have pulmonary artery systolic pressure of 35 mmHg.  Over the summer his atenolol was transitioned to metoprolol succinate given age-related renal clearance and mildly reduced ejection fraction.  I let him know that I agree with this change.  I did question whether his fatigue temporally correlates with this transition, he cannot correlate the two precisely.  We discussed fatigue with  beta-blocker as a possibility.  Past Medical History:  Diagnosis Date  . Anemia   . Arthritis   . Back pain   . Bursitis of left hip   . Coronary artery disease    TWO VESSEL BYPASS SURGERY in January 1995  . Diverticulosis   . Hyperlipidemia   . Hypertension   . Monoclonal gammopathy   . Neuropathy     Past Surgical History:  Procedure Laterality Date  . ADENOIDECTOMY    . [place], 2002   X3  . CORONARY ARTERY BYPASS GRAFT     X2. LIMA GRAFT AND A RIMA GRAFT.  Marland Kitchen INGUINAL HERNIA REPAIR Left   . Negative stress echo  08/11/2009  . US ECHOCARDIOGRAPHY  June 2012   Normal EF, mild aortic regurgitation, mild MR, LAE, mildly dilated RV, RAE, increased left atrial pressure, moderate to severe TR and mildly increased  PA systolic pressure.     Current Medications: Current Meds  Medication Sig  . Ascorbic Acid (VITAMIN C) 100 MG tablet Take 100 mg by mouth 2 (two) times daily.   Marland Kitchen aspirin 81 MG tablet Take 81 mg by mouth daily.  Marland Kitchen atorvastatin (LIPITOR) 20 MG tablet Take 10 mg by mouth daily.   . B Complex Vitamins (VITAMIN B COMPLEX PO) Take 1 tablet by mouth daily.  . Cholecalciferol (VITAMIN D PO) Take 1,000 Units by mouth daily.   Marland Kitchen gabapentin (NEURONTIN) 300 MG capsule Take 300 mg by mouth daily as needed.  Marland Kitchen  LYRICA 75 MG capsule   . metoprolol succinate (TOPROL-XL) 25 MG 24 hr tablet Take 75 mg by mouth. Pt takes 12.5 mg every other day and 25 mg on opposite days.  . quinapril (ACCUPRIL) 20 MG tablet Take 20 mg by mouth every morning.   Marland Kitchen SYNTHROID 75 MCG tablet Take 75 mcg by mouth daily before breakfast.      Allergies:   Cortisone   Social History   Tobacco Use  . Smoking status: Former Smoker    Packs/day: 2.00    Years: 5.00    Pack years: 10.00    Quit date: 10/04/1967    Years since quitting: 52.8  . Smokeless tobacco: Never Used  Substance Use Topics  . Alcohol use: Yes    Alcohol/week: 7.0 standard drinks    Types: 7 Shots of liquor per  week  . Drug use: No     Family History: The patient's family history includes Cancer in his sister; Diabetes in his father; Lung cancer (age of onset: 75) in his father.  ROS:   Please see the history of present illness.    All other systems reviewed and are negative.  EKGs/Labs/Other Studies Reviewed:    The following studies were reviewed today:  EKG: Normal sinus rhythm, right bundle branch block, left anterior fascicular block.  QRS duration 122 ms, heart rate 66 bpm. Compared to prior EKGs, no significant worsening of conduction system disease noted.  Recent Labs: 03/23/2020: ALT 21; BUN 20; Creatinine, Ser 1.19; Hemoglobin 11.7; Platelets 232; Potassium 4.5; Sodium 139  Recent Lipid Panel    Component Value Date/Time   CHOL 178 03/23/2020 1333   TRIG 142 03/23/2020 1333   HDL 54 03/23/2020 1333   CHOLHDL 3.3 03/23/2020 1333   VLDL 28 03/23/2020 1333   LDLCALC 96 03/23/2020 1333    Physical Exam:    VS:  BP 100/60 (BP Location: Left Arm, Patient Position: Sitting)   Pulse 66   Ht 5\' 10"  (1.778 m)   Wt 149 lb 12.8 oz (67.9 kg)   SpO2 98%   BMI 21.49 kg/m     Wt Readings from Last 5 Encounters:  08/07/20 149 lb 12.8 oz (67.9 kg)  03/30/20 155 lb 12.8 oz (70.7 kg)  12/26/19 160 lb 6.4 oz (72.8 kg)  03/28/19 157 lb 9.6 oz (71.5 kg)  11/15/18 160 lb (72.6 kg)    Constitutional: No acute distress Eyes: sclera non-icteric, normal conjunctiva and lids ENMT: normal dentition, moist mucous membranes Cardiovascular: regular rhythm, normal rate, no murmurs. S1 and S2 normal. Radial pulses normal bilaterally. No jugular venous distention.  Respiratory: clear to auscultation bilaterally GI : normal bowel sounds, soft and nontender. No distention.   MSK: extremities warm, well perfused. No edema.  NEURO: grossly nonfocal exam, moves all extremities. PSYCH: alert and oriented x 3, normal mood and affect.   ASSESSMENT:    1. Fatigue, unspecified type   2. Monoclonal  gammopathy   3. Coronary artery disease involving coronary bypass graft of native heart without angina pectoris    PLAN:    He presents with acute on chronic fatigue, and is concerned given no clear etiology.  We discussed that this may be related to transition of beta-blocker, though he cannot be sure about the timing in relation to the worsening of his fatigue.  He has a monoclonal gammopathy with abnormal M spike, and with new technology on echocardiography, I would like to perform strain imaging on his echo images to  evaluate for any evidence of cardiac amyloidosis that may be a contributing factor.  In addition he has a mildly reduced EF from prior.  He has no specific cardiopulmonary symptoms, so I suspect this will not have changed from 2014, but would be reasonable to assess.  I have discussed this with the patient and his wife and participated in shared decision making, they would like to proceed with echo. Will repeat echo to see if EF has dropped and get strain in setting of monoclonal gammopathy and assess for cardiac amyloidosis.   After echocardiogram, we will make a follow-up appointment with Dr. Martinique to review with patient.  He has bifascicular block on ECG, but this does not appear to have changed significantly from prior.  If echocardiogram shows no significant change, could consider exercise treadmill test for chronotropic incompetence, and could consider cardiac monitor for any episodes of advanced conduction system disease contributing to symptoms.  For CAD, continue aspirin, beta-blocker, statin.  Hypertension-continue quinapril and metoprolol  Hyperlipidemia-continue statin.  Total time of encounter: 30 minutes total time of encounter, including 25 minutes spent in face-to-face patient care on the date of this encounter. This time includes coordination of care and counseling regarding above mentioned problem list. Remainder of non-face-to-face time involved reviewing  chart documents/testing relevant to the patient encounter and documentation in the medical record. I have independently reviewed documentation from referring provider.   Cherlynn Kaiser, MD [place]  [facility]    Medication Adjustments/Labs and Tests Ordered: Current medicines are reviewed at length with the patient today.  Concerns regarding medicines are outlined above.   Orders Placed This Encounter  Procedures  . EKG 12-Lead  . ECHOCARDIOGRAM COMPLETE    No orders of the defined types were placed in this encounter.   Patient Instructions  Medication Instructions:  No Changes In Medications at this time.  *If you need a refill on your cardiac medications before your next appointment, please call your pharmacy*  Lab Work: None Ordered At This Time.  If you have labs (blood work) drawn today and your tests are completely normal, you will receive your results only by: Marland Kitchen MyChart Message (if you have MyChart) OR . A paper copy in the mail If you have any lab test that is abnormal or we need to change your treatment, we will call you to review the results.  Testing/Procedures: Your physician has requested that you have an echocardiogram. Echocardiography is a painless test that uses sound waves to create images of your heart. It provides your doctor with information about the size and shape of your heart and how well your heart's chambers and valves are working. You may receive an ultrasound enhancing agent through an IV if needed to better visualize your heart during the echo.This procedure takes approximately one hour. There are no restrictions for this procedure. This will take place at the [address].    Follow-Up: At [facility], you and your health needs are our priority.  As part of our continuing mission to provide you with exceptional heart care, we have created designated Provider Care Teams.  These Care Teams include your primary Cardiologist  (physician) and Advanced Practice Providers (APPs -  Physician Assistants and Nurse Practitioners) who all work together to provide you with the care you need, when you need it.  Your next appointment:   1 month(s)  The format for your next appointment:   In Person  Provider:   Peter Martinique,  MD

## 2020-08-07 NOTE — Patient Instructions (Signed)
Medication Instructions:  No Changes In Medications at this time.  *If you need a refill on your cardiac medications before your next appointment, please call your pharmacy*  Lab Work: None Ordered At This Time.  If you have labs (blood work) drawn today and your tests are completely normal, you will receive your results only by:  [place] (if you have MyChart) OR  A paper copy in the mail If you have any lab test that is abnormal or we need to change your treatment, we will call you to review the results.  Testing/Procedures: Your physician has requested that you have an echocardiogram. Echocardiography is a painless test that uses sound waves to create images of your heart. It provides your doctor with information about the size and shape of your heart and how well your hearts chambers and valves are working. You may receive an ultrasound enhancing agent through an IV if needed to better visualize your heart during the echo.This procedure takes approximately one hour. There are no restrictions for this procedure. This will take place at the [address].    Follow-Up: At [facility], you and your health needs are our priority.  As part of our continuing mission to provide you with exceptional heart care, we have created designated Provider Care Teams.  These Care Teams include your primary Cardiologist (physician) and Advanced Practice Providers (APPs -  Physician Assistants and Nurse Practitioners) who all work together to provide you with the care you need, when you need it.  Your next appointment:   1 month(s)  The format for your next appointment:   In Person  Provider:   Peter Martinique, MD

## 2020-08-13 ENCOUNTER — Telehealth: Payer: Self-pay

## 2020-08-13 NOTE — Telephone Encounter (Signed)
Called and left a message asking him to call the office back. 

## 2020-08-13 NOTE — Telephone Encounter (Signed)
I can see him tomorrow at 840 am, no labs Arrive early

## 2020-08-13 NOTE — Telephone Encounter (Signed)
Called and scheduled appt for 8:40 am tomorrow. Instructed to arrive early. He verbalized understanding.

## 2020-08-13 NOTE — Telephone Encounter (Signed)
He called and left a message. He feels that he is having increased symptoms of cancer. Complaining of weakness and he has lost 7-8 lbs in the last 2 months. He saw Dr. Brigitte Pulse recently with labs. He is concerned and asking for appt.

## 2020-08-14 ENCOUNTER — Encounter: Payer: Self-pay | Admitting: Hematology and Oncology

## 2020-08-14 ENCOUNTER — Telehealth: Payer: Self-pay | Admitting: Hematology and Oncology

## 2020-08-14 ENCOUNTER — Inpatient Hospital Stay: Payer: Medicare Other | Attending: Hematology and Oncology | Admitting: Hematology and Oncology

## 2020-08-14 ENCOUNTER — Other Ambulatory Visit: Payer: Self-pay

## 2020-08-14 VITALS — BP 138/71 | HR 77 | Temp 97.6°F | Resp 18 | Ht 70.0 in | Wt 150.6 lb

## 2020-08-14 DIAGNOSIS — G621 Alcoholic polyneuropathy: Secondary | ICD-10-CM

## 2020-08-14 DIAGNOSIS — Z87891 Personal history of nicotine dependence: Secondary | ICD-10-CM | POA: Insufficient documentation

## 2020-08-14 DIAGNOSIS — K921 Melena: Secondary | ICD-10-CM | POA: Diagnosis not present

## 2020-08-14 DIAGNOSIS — D472 Monoclonal gammopathy: Secondary | ICD-10-CM

## 2020-08-14 DIAGNOSIS — D649 Anemia, unspecified: Secondary | ICD-10-CM | POA: Insufficient documentation

## 2020-08-14 DIAGNOSIS — R059 Cough, unspecified: Secondary | ICD-10-CM

## 2020-08-14 DIAGNOSIS — J8410 Pulmonary fibrosis, unspecified: Secondary | ICD-10-CM | POA: Diagnosis not present

## 2020-08-14 DIAGNOSIS — R918 Other nonspecific abnormal finding of lung field: Secondary | ICD-10-CM

## 2020-08-14 DIAGNOSIS — Z79899 Other long term (current) drug therapy: Secondary | ICD-10-CM | POA: Insufficient documentation

## 2020-08-14 DIAGNOSIS — K5730 Diverticulosis of large intestine without perforation or abscess without bleeding: Secondary | ICD-10-CM | POA: Insufficient documentation

## 2020-08-14 DIAGNOSIS — Z7982 Long term (current) use of aspirin: Secondary | ICD-10-CM | POA: Insufficient documentation

## 2020-08-14 DIAGNOSIS — R634 Abnormal weight loss: Secondary | ICD-10-CM

## 2020-08-14 MED ORDER — AMOXICILLIN 500 MG PO TABS
500.0000 mg | ORAL_TABLET | Freq: Two times a day (BID) | ORAL | 0 refills | Status: DC
Start: 2020-08-14 — End: 2020-10-08

## 2020-08-14 NOTE — Assessment & Plan Note (Addendum)
He has unintentional weight loss that is profound According to the patient's, he has his blood work checked at primary care doctor's office for metabolic causes We will get a copy of the results Given history of smoking and recent passage of black stool, I recommend CT imaging of the abdomen and pelvis for evaluation to rule out abdominal malignancy and he is in agreement In the meantime, I recommend frequent small meals

## 2020-08-14 NOTE — Assessment & Plan Note (Signed)
He has unresolved cough for 2 weeks Examination revealed bilateral crackles more on the left compared to the right I will put him on a course of antibiotic therapy He is fully vaccinated against COVID-19 and influenza vaccination

## 2020-08-14 NOTE — Progress Notes (Signed)
[place] OFFICE PROGRESS NOTE  Patient Care Team: Marton Redwood, MD as PCP - General (Internal Medicine) Inda Castle, MD (Inactive) as Attending Physician (Gastroenterology) Martinique, Peter M, MD as Attending Physician (Cardiology) Jarome Matin, MD as Consulting Physician (Dermatology) System, Provider Not In Heath Lark, MD as Consulting Physician (Hematology and Oncology)  ASSESSMENT & PLAN:  Monoclonal gammopathy His recent myeloma panel was stable I do not believe this is the cause of his unexplained weight loss   Weight loss, non-intentional He has unintentional weight loss that is profound According to the patient's, he has his blood work checked at primary care doctor's office for metabolic causes We will get a copy of the results Given history of smoking and recent passage of black stool, I recommend CT imaging of the abdomen and pelvis for evaluation to rule out abdominal malignancy and he is in agreement In the meantime, I recommend frequent small meals  Lung nodules He was a smoker He has unintentional weight loss His prior imaging studies show lung nodules He has crackles on exam I will order a CT imaging of the chest to rule out malignancy  Cough in adult He has unresolved cough for 2 weeks Examination revealed bilateral crackles more on the left compared to the right I will put him on a course of antibiotic therapy He is fully vaccinated against COVID-19 and influenza vaccination   Orders Placed This Encounter  Procedures  . CT CHEST ABDOMEN PELVIS W CONTRAST    Standing Status:   Future    Standing Expiration Date:   08/14/2021    Order Specific Question:   Preferred imaging location?    Answer:   [facility]    Order Specific Question:   Radiology Contrast Protocol - do NOT remove file path    Answer:   [email or web address]    All questions were answered. The patient knows to call the  clinic with any problems, questions or concerns. The total time spent in the appointment was 30 minutes encounter with patients including review of chart and various tests results, discussions about plan of care and coordination of care plan   Heath Lark, MD 08/14/2020 9:54 AM  INTERVAL HISTORY: Please see below for problem oriented charting. He is seen urgently per patient request He has progressive weight loss, recent unresolved cough and slight changes in bowel habits He quit drinking since last time I saw him He went for a trip in September to [place] and since he returned, has not been feeling well Denies recent fever or chills He is concerned because multiple neighbors was diagnosed with cancer recently  SUMMARY OF ONCOLOGIC HISTORY:  This is a pleasant gentleman who was found to have anemia and abnormal M spike. He had further workup including blood work, urine test, skeletal survey as well as bone marrow aspirate and biopsy. 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 He also has abnormal lung nodule, observed with serial imaging CT scan. Last CT scan of the chest dated June 2016, confirmed benign lung nodules with no further follow-up recommended  REVIEW OF SYSTEMS:   Constitutional: Denies fevers, chills  Eyes: Denies blurriness of vision Ears, nose, mouth, throat, and face: Denies mucositis or sore throat Cardiovascular: Denies palpitation, chest discomfort or lower extremity swelling Gastrointestinal:  Denies nausea, heartburn or change in bowel habits Skin: Denies abnormal skin rashes Lymphatics: Denies new lymphadenopathy or easy bruising Neurological:Denies numbness, tingling or new weaknesses Behavioral/Psych: Mood is stable, no  new changes  All other systems were reviewed with the patient and are negative.  I have reviewed the past medical history, past surgical history, social history and family history with the patient and they are unchanged from previous  note.  ALLERGIES:  is allergic to cortisone.  MEDICATIONS:  Current Outpatient Medications  Medication Sig Dispense Refill  . amoxicillin (AMOXIL) 500 MG tablet Take 1 tablet (500 mg total) by mouth 2 (two) times daily. 10 tablet 0  . Ascorbic Acid (VITAMIN C) 100 MG tablet Take 100 mg by mouth 2 (two) times daily.     Marland Kitchen aspirin 81 MG tablet Take 81 mg by mouth daily.    Marland Kitchen atorvastatin (LIPITOR) 20 MG tablet Take 10 mg by mouth daily.     . B Complex Vitamins (VITAMIN B COMPLEX PO) Take 1 tablet by mouth daily.    . Cholecalciferol (VITAMIN D PO) Take 1,000 Units by mouth daily.     Marland Kitchen gabapentin (NEURONTIN) 300 MG capsule Take 300 mg by mouth daily as needed.    Marland Kitchen HYDROcodone-acetaminophen (NORCO/VICODIN) 5-325 MG tablet Take 1 tablet by mouth every 6 (six) hours as needed for moderate pain. (Patient not taking: Reported on 08/07/2020) 30 tablet 0  . LYRICA 75 MG capsule     . metoprolol succinate (TOPROL-XL) 25 MG 24 hr tablet Take 75 mg by mouth. Pt takes 12.5 mg every other day and 25 mg on opposite days.    . quinapril (ACCUPRIL) 20 MG tablet Take 20 mg by mouth every morning.     Marland Kitchen SYNTHROID 75 MCG tablet Take 75 mcg by mouth daily before breakfast.      No current facility-administered medications for this visit.    PHYSICAL EXAMINATION: ECOG PERFORMANCE STATUS: 1 - Symptomatic but completely ambulatory  Vitals:   08/14/20 0841  BP: 138/71  Pulse: 77  Resp: 18  Temp: 97.6 F (36.4 C)  SpO2: 98%   Filed Weights   08/14/20 0841  Weight: 150 lb 9.6 oz (68.3 kg)    GENERAL:alert, no distress and comfortable SKIN: skin color, texture, turgor are normal, no rashes or significant lesions EYES: normal, Conjunctiva are pink and non-injected, sclera clear OROPHARYNX:no exudate, no erythema and lips, buccal mucosa, and tongue normal  NECK: supple, thyroid normal size, non-tender, without nodularity LYMPH:  no palpable lymphadenopathy in the cervical, axillary or  inguinal LUNGS: He has bilateral crackles, left greater than right  HEART: regular rate & rhythm and no murmurs and no lower extremity edema ABDOMEN:abdomen soft, non-tender and normal bowel sounds Musculoskeletal:no cyanosis of digits and no clubbing  NEURO: alert & oriented x 3 with fluent speech, no focal motor/sensory deficits  LABORATORY DATA:  I have reviewed the data as listed    Component Value Date/Time   NA 139 03/23/2020 1333   NA 135 (L) 03/23/2017 1316   K 4.5 03/23/2020 1333   K 4.5 03/23/2017 1316   CL 106 03/23/2020 1333   CL 94 (L) 03/20/2013 1510   CO2 25 03/23/2020 1333   CO2 26 03/23/2017 1316   GLUCOSE 101 (H) 03/23/2020 1333   GLUCOSE 103 03/23/2017 1316   GLUCOSE 122 (H) 03/20/2013 1510   BUN 20 03/23/2020 1333   BUN 17.8 03/23/2017 1316   CREATININE 1.19 03/23/2020 1333   CREATININE 1.3 03/23/2017 1316   CALCIUM 8.6 (L) 03/23/2020 1333   CALCIUM 9.6 03/23/2017 1316   PROT 7.0 03/23/2020 1333   PROT 6.5 03/23/2017 1317   PROT 6.8 03/23/2017  1316   ALBUMIN 3.9 03/23/2020 1333   ALBUMIN 3.8 03/23/2017 1316   AST 21 03/23/2020 1333   AST 29 03/23/2017 1316   ALT 21 03/23/2020 1333   ALT 28 03/23/2017 1316   ALKPHOS 57 03/23/2020 1333   ALKPHOS 59 03/23/2017 1316   BILITOT 0.6 03/23/2020 1333   BILITOT 1.00 03/23/2017 1316   GFRNONAA 57 (L) 03/23/2020 1333   GFRAA >60 03/23/2020 1333    No results found for: SPEP, UPEP  Lab Results  Component Value Date   WBC 7.7 03/23/2020   NEUTROABS 4.9 03/23/2020   HGB 11.7 (L) 03/23/2020   HCT 34.8 (L) 03/23/2020   MCV 92.3 03/23/2020   PLT 232 03/23/2020      Chemistry      Component Value Date/Time   NA 139 03/23/2020 1333   NA 135 (L) 03/23/2017 1316   K 4.5 03/23/2020 1333   K 4.5 03/23/2017 1316   CL 106 03/23/2020 1333   CL 94 (L) 03/20/2013 1510   CO2 25 03/23/2020 1333   CO2 26 03/23/2017 1316   BUN 20 03/23/2020 1333   BUN 17.8 03/23/2017 1316   CREATININE 1.19 03/23/2020 1333    CREATININE 1.3 03/23/2017 1316      Component Value Date/Time   CALCIUM 8.6 (L) 03/23/2020 1333   CALCIUM 9.6 03/23/2017 1316   ALKPHOS 57 03/23/2020 1333   ALKPHOS 59 03/23/2017 1316   AST 21 03/23/2020 1333   AST 29 03/23/2017 1316   ALT 21 03/23/2020 1333   ALT 28 03/23/2017 1316   BILITOT 0.6 03/23/2020 1333   BILITOT 1.00 03/23/2017 1316

## 2020-08-14 NOTE — Assessment & Plan Note (Signed)
He was a smoker He has unintentional weight loss His prior imaging studies show lung nodules He has crackles on exam I will order a CT imaging of the chest to rule out malignancy

## 2020-08-14 NOTE — Assessment & Plan Note (Signed)
His recent myeloma panel was stable I do not believe this is the cause of his unexplained weight loss

## 2020-08-14 NOTE — Telephone Encounter (Signed)
Scheduled appointments per 11/12 sch msg. Spoke to patient who is aware of appointment date and time.  

## 2020-08-21 ENCOUNTER — Telehealth: Payer: Self-pay

## 2020-08-21 ENCOUNTER — Other Ambulatory Visit: Payer: Self-pay

## 2020-08-21 ENCOUNTER — Ambulatory Visit (HOSPITAL_BASED_OUTPATIENT_CLINIC_OR_DEPARTMENT_OTHER): Payer: Medicare Other

## 2020-08-21 ENCOUNTER — Inpatient Hospital Stay: Payer: Medicare Other

## 2020-08-21 ENCOUNTER — Ambulatory Visit (HOSPITAL_BASED_OUTPATIENT_CLINIC_OR_DEPARTMENT_OTHER)
Admission: RE | Admit: 2020-08-21 | Discharge: 2020-08-21 | Disposition: A | Discharge status: Home / Self Care | Payer: Medicare Other | Source: Ambulatory Visit | Attending: Hematology and Oncology | Admitting: Hematology and Oncology

## 2020-08-21 DIAGNOSIS — D472 Monoclonal gammopathy: Secondary | ICD-10-CM | POA: Diagnosis not present

## 2020-08-21 DIAGNOSIS — R634 Abnormal weight loss: Secondary | ICD-10-CM | POA: Diagnosis present

## 2020-08-21 DIAGNOSIS — R918 Other nonspecific abnormal finding of lung field: Secondary | ICD-10-CM | POA: Diagnosis not present

## 2020-08-21 DIAGNOSIS — K921 Melena: Secondary | ICD-10-CM

## 2020-08-21 LAB — CBC WITH DIFFERENTIAL/PLATELET
Abs Immature Granulocytes: 0.02 10*3/uL (ref 0.00–0.07)
Basophils Absolute: 0.1 10*3/uL (ref 0.0–0.1)
Basophils Relative: 1 %
Eosinophils Absolute: 0.3 10*3/uL (ref 0.0–0.5)
Eosinophils Relative: 4 %
HCT: 35.5 % — ABNORMAL LOW (ref 39.0–52.0)
Hemoglobin: 11.9 g/dL — ABNORMAL LOW (ref 13.0–17.0)
Immature Granulocytes: 0 %
Lymphocytes Relative: 28 %
Lymphs Abs: 2 10*3/uL (ref 0.7–4.0)
MCH: 31.2 pg (ref 26.0–34.0)
MCHC: 33.5 g/dL (ref 30.0–36.0)
MCV: 93.2 fL (ref 80.0–100.0)
Monocytes Absolute: 0.7 10*3/uL (ref 0.1–1.0)
Monocytes Relative: 10 %
Neutro Abs: 4.1 10*3/uL (ref 1.7–7.7)
Neutrophils Relative %: 57 %
Platelets: 285 10*3/uL (ref 150–400)
RBC: 3.81 MIL/uL — ABNORMAL LOW (ref 4.22–5.81)
RDW: 13.2 % (ref 11.5–15.5)
WBC: 7.2 10*3/uL (ref 4.0–10.5)
nRBC: 0 % (ref 0.0–0.2)

## 2020-08-21 LAB — COMPREHENSIVE METABOLIC PANEL
ALT: 17 U/L (ref 0–44)
AST: 19 U/L (ref 15–41)
Albumin: 3.8 g/dL (ref 3.5–5.0)
Alkaline Phosphatase: 61 U/L (ref 38–126)
Anion gap: 8 (ref 5–15)
BUN: 21 mg/dL (ref 8–23)
CO2: 28 mmol/L (ref 22–32)
Calcium: 9.2 mg/dL (ref 8.9–10.3)
Chloride: 102 mmol/L (ref 98–111)
Creatinine, Ser: 1.22 mg/dL (ref 0.61–1.24)
GFR, Estimated: 60 mL/min — ABNORMAL LOW (ref >60)
Glucose, Bld: 118 mg/dL — ABNORMAL HIGH (ref 70–99)
Potassium: 4.5 mmol/L (ref 3.5–5.1)
Sodium: 138 mmol/L (ref 135–145)
Total Bilirubin: 0.5 mg/dL (ref 0.3–1.2)
Total Protein: 7.5 g/dL (ref 6.5–8.1)

## 2020-08-21 MED ORDER — IOHEXOL 300 MG/ML  SOLN
100.0000 mL | Freq: Once | INTRAMUSCULAR | Status: AC | PRN
  Administered 2020-08-21: 100 mL via INTRAVENOUS

## 2020-08-21 NOTE — Telephone Encounter (Signed)
Called and scheduled lab appt today at 1030. He is aware of the time. Received a secure chat from [facility] that they are canceling CT scan today at 1:30. He was upset that no one had called him and he will be out of town next week. Will contact radiology to reschedule.

## 2020-08-21 NOTE — Telephone Encounter (Signed)
Called back. [facility] added the scan back at 1330. He is aware of CT appt at 1330.

## 2020-08-24 ENCOUNTER — Other Ambulatory Visit: Payer: Self-pay

## 2020-08-24 ENCOUNTER — Encounter: Payer: Self-pay | Admitting: Hematology and Oncology

## 2020-08-24 ENCOUNTER — Inpatient Hospital Stay (HOSPITAL_BASED_OUTPATIENT_CLINIC_OR_DEPARTMENT_OTHER): Payer: Medicare Other | Admitting: Hematology and Oncology

## 2020-08-24 DIAGNOSIS — R634 Abnormal weight loss: Secondary | ICD-10-CM | POA: Diagnosis not present

## 2020-08-24 DIAGNOSIS — J8410 Pulmonary fibrosis, unspecified: Secondary | ICD-10-CM

## 2020-08-24 DIAGNOSIS — K5730 Diverticulosis of large intestine without perforation or abscess without bleeding: Secondary | ICD-10-CM

## 2020-08-24 DIAGNOSIS — D472 Monoclonal gammopathy: Secondary | ICD-10-CM | POA: Diagnosis not present

## 2020-08-24 NOTE — Assessment & Plan Note (Signed)
I reviewed his current CT imaging and compared with the one from 5 years ago There are clear signs of progression of pulmonary infiltrate, detectable on physical exam The patient was exposed to a lot of chemicals when he was younger I recommend pulmonary evaluation He is mildly symptomatic with occasional cough and shortness of breath on minimal exertion

## 2020-08-24 NOTE — Assessment & Plan Note (Signed)
I reviewed multiple CT imaging with the patient extensively There is no definitive signs of malignancy I recommend we pursue further work-up with pulmonology evaluation and repeat colonoscopy In the meantime, I recommend increase oral intake as tolerated and gradual exercise as tolerated

## 2020-08-24 NOTE — Progress Notes (Signed)
[place] OFFICE PROGRESS NOTE  Patient Care Team: Marton Redwood, MD as PCP - General (Internal Medicine) Inda Castle, MD (Inactive) as Attending Physician (Gastroenterology) Martinique, Peter M, MD as Attending Physician (Cardiology) Jarome Matin, MD as Consulting Physician (Dermatology) System, Provider Not In Heath Lark, MD as Consulting Physician (Hematology and Oncology)  ASSESSMENT & PLAN:  Weight loss, non-intentional I reviewed multiple CT imaging with the patient extensively There is no definitive signs of malignancy I recommend we pursue further work-up with pulmonology evaluation and repeat colonoscopy In the meantime, I recommend increase oral intake as tolerated and gradual exercise as tolerated  Pulmonary fibrosis ([place]) I reviewed his current CT imaging and compared with the one from 5 years ago There are clear signs of progression of pulmonary infiltrate, detectable on physical exam The patient was exposed to a lot of chemicals when he was younger I recommend pulmonary evaluation He is mildly symptomatic with occasional cough and shortness of breath on minimal exertion  Diverticulosis of colon His last colonoscopy was 7 years ago With recent findings of abnormal weight loss, I recommend repeat colonoscopy for further evaluation I told him while CT showed no definitive signs of malignancy, it would not exclude early signs of malignancy and the best way to evaluate this would be colonoscopy and he agreed for referral   Orders Placed This Encounter  Procedures  . Ambulatory referral to Pulmonology    Referral Priority:   Routine    Referral Type:   Consultation    Referral Reason:   Specialty Services Required    Requested Specialty:   Pulmonary Disease    Number of Visits Requested:   1  . Ambulatory referral to Gastroenterology    Referral Priority:   Routine    Referral Type:   Consultation    Referral Reason:   Specialty Services Required     Number of Visits Requested:   1    All questions were answered. The patient knows to call the clinic with any problems, questions or concerns. The total time spent in the appointment was 30 minutes encounter with patients including review of chart and various tests results, discussions about plan of care and coordination of care plan   Heath Lark, MD 08/24/2020 3:13 PM  INTERVAL HISTORY: Please see below for problem oriented charting. Returns for further follow-up He has noticed some occasional dry cough and shortness of breath on minimal exertion He is attempting to increase oral intake with occasional snacking to keep his weight up  SUMMARY OF ONCOLOGIC HISTORY:  This is a pleasant gentleman who was found to have anemia and abnormal M spike. He had further workup including blood work, urine test, skeletal survey as well as bone marrow aspirate and biopsy. 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 He also has abnormal lung nodule, observed with serial imaging CT scan. Last CT scan of the chest dated June 2016, confirmed benign lung nodules with no further follow-up recommended Due to abnormal weight loss, he underwent CT imaging of the chest, abdomen and pelvis on August 21, 2020 which showed progression of pulmonary fibrosis and diverticulosis of the colon  REVIEW OF SYSTEMS:   Constitutional: Denies fevers, chills  Eyes: Denies blurriness of vision Ears, nose, mouth, throat, and face: Denies mucositis or sore throat Cardiovascular: Denies palpitation, chest discomfort or lower extremity swelling Gastrointestinal:  Denies nausea, heartburn or change in bowel habits Skin: Denies abnormal skin rashes Lymphatics: Denies new lymphadenopathy or easy  bruising Neurological:Denies numbness, tingling or new weaknesses Behavioral/Psych: Mood is stable, no new changes  All other systems were reviewed with the patient and are negative.  I have reviewed the past medical history, past  surgical history, social history and family history with the patient and they are unchanged from previous note.  ALLERGIES:  is allergic to cortisone.  MEDICATIONS:  Current Outpatient Medications  Medication Sig Dispense Refill  . amoxicillin (AMOXIL) 500 MG tablet Take 1 tablet (500 mg total) by mouth 2 (two) times daily. 10 tablet 0  . Ascorbic Acid (VITAMIN C) 100 MG tablet Take 100 mg by mouth 2 (two) times daily.     Marland Kitchen aspirin 81 MG tablet Take 81 mg by mouth daily.    Marland Kitchen atorvastatin (LIPITOR) 20 MG tablet Take 10 mg by mouth daily.     . B Complex Vitamins (VITAMIN B COMPLEX PO) Take 1 tablet by mouth daily.    . Cholecalciferol (VITAMIN D PO) Take 1,000 Units by mouth daily.     Marland Kitchen gabapentin (NEURONTIN) 300 MG capsule Take 300 mg by mouth daily as needed.    Marland Kitchen HYDROcodone-acetaminophen (NORCO/VICODIN) 5-325 MG tablet Take 1 tablet by mouth every 6 (six) hours as needed for moderate pain. (Patient not taking: Reported on 08/07/2020) 30 tablet 0  . LYRICA 75 MG capsule     . metoprolol succinate (TOPROL-XL) 25 MG 24 hr tablet Take 75 mg by mouth. Pt takes 12.5 mg every other day and 25 mg on opposite days.    . quinapril (ACCUPRIL) 20 MG tablet Take 20 mg by mouth every morning.     Marland Kitchen SYNTHROID 75 MCG tablet Take 75 mcg by mouth daily before breakfast.      No current facility-administered medications for this visit.    PHYSICAL EXAMINATION: ECOG PERFORMANCE STATUS: 1 - Symptomatic but completely ambulatory  Vitals:   08/24/20 0841  BP: (!) 110/59  Pulse: 75  Resp: 18  SpO2: 99%   Filed Weights   08/24/20 0841  Weight: 151 lb 3.2 oz (68.6 kg)    GENERAL:alert, no distress and comfortable NEURO: alert & oriented x 3 with fluent speech, no focal motor/sensory deficits  LABORATORY DATA:  I have reviewed the data as listed    Component Value Date/Time   NA 138 08/21/2020 1040   NA 135 (L) 03/23/2017 1316   K 4.5 08/21/2020 1040   K 4.5 03/23/2017 1316   CL 102  08/21/2020 1040   CL 94 (L) 03/20/2013 1510   CO2 28 08/21/2020 1040   CO2 26 03/23/2017 1316   GLUCOSE 118 (H) 08/21/2020 1040   GLUCOSE 103 03/23/2017 1316   GLUCOSE 122 (H) 03/20/2013 1510   BUN 21 08/21/2020 1040   BUN 17.8 03/23/2017 1316   CREATININE 1.22 08/21/2020 1040   CREATININE 1.3 03/23/2017 1316   CALCIUM 9.2 08/21/2020 1040   CALCIUM 9.6 03/23/2017 1316   PROT 7.5 08/21/2020 1040   PROT 6.5 03/23/2017 1317   PROT 6.8 03/23/2017 1316   ALBUMIN 3.8 08/21/2020 1040   ALBUMIN 3.8 03/23/2017 1316   AST 19 08/21/2020 1040   AST 29 03/23/2017 1316   ALT 17 08/21/2020 1040   ALT 28 03/23/2017 1316   ALKPHOS 61 08/21/2020 1040   ALKPHOS 59 03/23/2017 1316   BILITOT 0.5 08/21/2020 1040   BILITOT 1.00 03/23/2017 1316   GFRNONAA 60 (L) 08/21/2020 1040   GFRAA >60 03/23/2020 1333    No results found for: SPEP, UPEP  Lab Results  Component Value Date   WBC 7.2 08/21/2020   NEUTROABS 4.1 08/21/2020   HGB 11.9 (L) 08/21/2020   HCT 35.5 (L) 08/21/2020   MCV 93.2 08/21/2020   PLT 285 08/21/2020      Chemistry      Component Value Date/Time   NA 138 08/21/2020 1040   NA 135 (L) 03/23/2017 1316   K 4.5 08/21/2020 1040   K 4.5 03/23/2017 1316   CL 102 08/21/2020 1040   CL 94 (L) 03/20/2013 1510   CO2 28 08/21/2020 1040   CO2 26 03/23/2017 1316   BUN 21 08/21/2020 1040   BUN 17.8 03/23/2017 1316   CREATININE 1.22 08/21/2020 1040   CREATININE 1.3 03/23/2017 1316      Component Value Date/Time   CALCIUM 9.2 08/21/2020 1040   CALCIUM 9.6 03/23/2017 1316   ALKPHOS 61 08/21/2020 1040   ALKPHOS 59 03/23/2017 1316   AST 19 08/21/2020 1040   AST 29 03/23/2017 1316   ALT 17 08/21/2020 1040   ALT 28 03/23/2017 1316   BILITOT 0.5 08/21/2020 1040   BILITOT 1.00 03/23/2017 1316       RADIOGRAPHIC STUDIES: I have reviewed multiple CT imaging with the patient and his wife I have personally reviewed the radiological images as listed and agreed with the findings in  the report. CT CHEST ABDOMEN PELVIS W CONTRAST  Result Date: 08/23/2020 CLINICAL DATA:  Unintentional weight loss of 8 pounds over 2 months. Melena. History of pulmonary nodules and monoclonal gammopathy. EXAM: CT CHEST, ABDOMEN, AND PELVIS WITH CONTRAST TECHNIQUE: Multidetector CT imaging of the chest, abdomen and pelvis was performed following the standard protocol during bolus administration of intravenous contrast. CONTRAST:  155mL OMNIPAQUE IOHEXOL 300 MG/ML  SOLN COMPARISON:  03/20/2015 chest CT. FINDINGS: CT CHEST FINDINGS Cardiovascular: Normal heart size. No significant pericardial effusion/thickening. Three-vessel coronary atherosclerosis status post CABG. Atherosclerotic nonaneurysmal thoracic aorta. Normal caliber pulmonary arteries. No central pulmonary emboli. Aberrant nonaneurysmal right subclavian artery arising from the distal arch with retroesophageal course. Mediastinum/Nodes: No discrete thyroid nodules. Unremarkable esophagus. No pathologically enlarged axillary, mediastinal or hilar lymph nodes. Lungs/Pleura: No pneumothorax. No pleural effusion. Basilar right lower lobe 6 mm pulmonary nodule (series 6/image 132), stable since 03/23/1915 chest CT, considered benign. No acute consolidative airspace disease, lung masses or new significant pulmonary nodules. There is moderate patchy confluent peripheral reticulation and ground-glass opacity throughout both lungs with associated mild traction bronchiectasis, architectural distortion and volume loss. There is a basilar predominance to these findings, which have clearly progressed in the interval. No frank honeycombing. Musculoskeletal: No aggressive appearing focal osseous lesions. Intact sternotomy wires. Mild thoracic spondylosis. CT ABDOMEN PELVIS FINDINGS Hepatobiliary: Normal liver with no liver mass. Normal gallbladder with no radiopaque cholelithiasis. No biliary ductal dilatation. Pancreas: Normal, with no mass or duct dilation. Spleen:  Normal size. No mass. Adrenals/Urinary Tract: Normal adrenals. No hydronephrosis. No renal masses. Chronic mild renal cortical scarring in posterior lower right kidney. Normal bladder. Stomach/Bowel: Normal non-distended stomach. Normal caliber small bowel with no small bowel wall thickening. Normal appendix. Moderate diverticulosis of terminal ileum and marked diverticulosis of the left colon, without large bowel wall thickening or acute pericolonic fat stranding. Vascular/Lymphatic: Atherosclerotic nonaneurysmal abdominal aorta. Patent portal, splenic, hepatic and renal veins. No pathologically enlarged lymph nodes in the abdomen or pelvis. Reproductive: Normal size prostate. Other: No pneumoperitoneum, ascites or focal fluid collection. Tiny fat containing umbilical hernia is unchanged. Musculoskeletal: No aggressive appearing focal osseous lesions. Marked lumbar spondylosis.  IMPRESSION: 1. Spectrum of findings compatible with basilar predominant fibrotic interstitial lung disease without frank honeycombing, clearly progressed in the interval. Findings are categorized as probable UIP per consensus guidelines: Diagnosis of Idiopathic Pulmonary Fibrosis: An Official ATS/ERS/JRS/ALAT Clinical Practice Guideline. [place], Iss 5, [phone], Jun 03 2017. 2. No lymphadenopathy or other findings suspicious for neoplastic disease. 3. Moderate diverticulosis of the terminal ileum and marked diverticulosis of the left colon, without acute diverticulitis. 4. Aberrant right subclavian artery. 5. Aortic Atherosclerosis (ICD10-I70.0). Electronically Signed   By: Ilona Sorrel M.D.   On: 08/23/2020 11:58

## 2020-08-24 NOTE — Assessment & Plan Note (Signed)
His last colonoscopy was 7 years ago With recent findings of abnormal weight loss, I recommend repeat colonoscopy for further evaluation I told him while CT showed no definitive signs of malignancy, it would not exclude early signs of malignancy and the best way to evaluate this would be colonoscopy and he agreed for referral

## 2020-09-01 ENCOUNTER — Other Ambulatory Visit: Payer: Self-pay

## 2020-09-01 ENCOUNTER — Ambulatory Visit (HOSPITAL_COMMUNITY): Payer: Medicare Other | Attending: Cardiology

## 2020-09-01 DIAGNOSIS — I25810 Atherosclerosis of coronary artery bypass graft(s) without angina pectoris: Secondary | ICD-10-CM | POA: Insufficient documentation

## 2020-09-01 DIAGNOSIS — R5383 Other fatigue: Secondary | ICD-10-CM | POA: Diagnosis present

## 2020-09-01 LAB — ECHOCARDIOGRAM COMPLETE
Area-P 1/2: 2.11 cm2
P 1/2 time: 456 msec
S' Lateral: 3.4 cm

## 2020-09-11 ENCOUNTER — Encounter: Payer: Self-pay | Admitting: Gastroenterology

## 2020-10-06 NOTE — Progress Notes (Signed)
Cardiology Office Note    Date:  10/09/2020   ID:  Mitchell Fields, DOB 09-19-1936, MRN [id number]  PCP:  Marton Redwood, MD  Cardiologist:  Lenzi Marmo Martinique, MD    History of Present Illness:  Mitchell Fields is a 85 y.o. male seen for follow up CAD. He has a known history of remote MI, remote CABG 1995 in [place], also has HTN and hyperlipidemia. He had an echo in June 2012 showing normal EF, mild aortic regurgitation, mild MR, mild to moderate LAE, mildly dilated RV, moderate RAE, moderate to severe TR and redundant atrial septum consistent with increased left atrial pressure. Echo in 2014 showed mild LV dysfunction with EF 45-50%. Mild AI and MR. CABG in 1995 included  LIMA and RIMA grafts. He has been diagnosed with monoclonal gammopathy and anemia of chronic disease.   He was seen in November by Dr Margaretann Loveless for symptoms of fatigue. He had been transitioned from atenolol to Toprol due to decreased EF. Repeat Echo done and was unchanged. He is only taking 12.5 mg daily of metoprolol and this really didn't change his fatigue. He states he went to [place] this past year and really had a tough time at altitude. He lost weight. He had a CT showing IPF. Now being seen by Dr Vaughan Browner. He tries to stay active walking, stretching and traveling. Denies any chest pain. Has no edema.    Past Medical History:  Diagnosis Date  . Anemia   . Arthritis   . Back pain   . Bursitis of left hip   . Coronary artery disease    TWO VESSEL BYPASS SURGERY in January 1995  . Diverticulosis   . Hyperlipidemia   . Hypertension   . Monoclonal gammopathy   . Neuropathy     Past Surgical History:  Procedure Laterality Date  . ADENOIDECTOMY    . [place], 2002   X3  . CORONARY ARTERY BYPASS GRAFT     X2. LIMA GRAFT AND A RIMA GRAFT.  Marland Kitchen INGUINAL HERNIA REPAIR Left   . Negative stress echo  08/11/2009  . US ECHOCARDIOGRAPHY  June 2012   Normal EF, mild aortic regurgitation, mild MR, LAE, mildly  dilated RV, RAE, increased left atrial pressure, moderate to severe TR and mildly increased  PA systolic pressure.     Current Medications: Outpatient Medications Prior to Visit  Medication Sig Dispense Refill  . Ascorbic Acid (VITAMIN C) 100 MG tablet Take 100 mg by mouth 2 (two) times daily.     Marland Kitchen aspirin 81 MG tablet Take 81 mg by mouth daily.    Marland Kitchen atorvastatin (LIPITOR) 20 MG tablet Take 10 mg by mouth daily.     . B Complex Vitamins (VITAMIN B COMPLEX PO) Take 1 tablet by mouth daily.    . Cholecalciferol (VITAMIN D PO) Take 1,000 Units by mouth daily.    Marland Kitchen gabapentin (NEURONTIN) 300 MG capsule Take 300 mg by mouth daily as needed.    Marland Kitchen HYDROcodone-acetaminophen (NORCO/VICODIN) 5-325 MG tablet Take 1 tablet by mouth every 6 (six) hours as needed for moderate pain. 30 tablet 0  . LYRICA 75 MG capsule     . metoprolol succinate (TOPROL-XL) 25 MG 24 hr tablet Take 75 mg by mouth. Pt takes 12.5 mg every other day and 25 mg on opposite days.    Marland Kitchen SYNTHROID 75 MCG tablet Take 75 mcg by mouth daily before breakfast.      No facility-administered medications prior  to visit.     Allergies:   Cortisone   Social History   Socioeconomic History  . Marital status: Married    Spouse name: Not on file  . Number of children: 2  . Years of education: Not on file  . Highest education level: Not on file  Occupational History  . 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  [employer]  Tobacco Use  . Smoking status: Former Smoker    Packs/day: 2.00    Years: 5.00    Pack years: 10.00    Quit date: 10/04/1967    Years since quitting: 53.0  . Smokeless tobacco: Never Used  Substance and Sexual Activity  . Alcohol use: Yes    Alcohol/week: 7.0 standard drinks    Types: 7 Shots of liquor per week  . Drug use: No  . Sexual activity: Not on file  Other Topics Concern  . Not on file  Social History Narrative  . Not on file   Social Determinants of Health    Financial Resource Strain: Not on file  Food Insecurity: Not on file  Transportation Needs: Not on file  Physical Activity: Not on file  Stress: Not on file  Social Connections: Not on file     Family History:  The patient's family history includes Cancer in his sister; Diabetes in his father; Lung cancer (age of onset: 69) in his father.   ROS:   Please see the history of present illness.    ROS All other systems reviewed and are negative.   PHYSICAL EXAM:   VS:  BP (!) 100/58   Pulse 60   Ht 5\' 10"  (1.778 m)   Wt 157 lb 9.6 oz (71.5 kg)   SpO2 96%   BMI 22.61 kg/m    GENERAL:  Well appearing WM in NAD HEENT:  PERRL, EOMI, sclera are clear. Oropharynx is clear. NECK:  No jugular venous distention, carotid upstroke brisk and symmetric, no bruits, no thyromegaly or adenopathy LUNGS:  Clear to auscultation bilaterally CHEST:  Scattered dry crackles.  HEART:  RRR,  PMI not displaced or sustained,S1 and S2 within normal limits, no S3, no S4: no clicks, no rubs, no murmurs ABD:  Soft, nontender. BS +, no masses or bruits. No hepatomegaly, no splenomegaly EXT:  2 + pulses throughout, no edema, no cyanosis no clubbing SKIN:  Warm and dry.  No rashes NEURO:  Alert and oriented x 3. Cranial nerves II through XII intact. PSYCH:  Cognitively intact   Wt Readings from Last 3 Encounters:  10/09/20 157 lb 9.6 oz (71.5 kg)  10/08/20 156 lb 6.4 oz (70.9 kg)  08/24/20 151 lb 3.2 oz (68.6 kg)      Studies/Labs Reviewed:   EKG:  EKG is not ordered today.   Labs dated 03/09/16: cholesterol 129, triglycerides 148, HDL 50, LDL 49.  Dated 10/04/16: creatinine 1.5. Other chemistries normal.  Dated 05/17/17: cholesterol 134, triglycerides 150, HDL 48, LDL 56. CBC, CMET, TSH normal.  Dated 03/01/18: cholesterol 149, triglycerides 82, HDL 56, LDL 77. CBC, CMET and TFTs normal. Dated 04/17/19: cholesterol 135, triglycerides 119, HDL 40, LDL 71.   Recent Labs: 08/21/2020: ALT 17; BUN 21;  Creatinine, Ser 1.22; Hemoglobin 11.9; Platelets 285; Potassium 4.5; Sodium 138   Lipid Panel    Component Value Date/Time   CHOL 178 03/23/2020 1333   TRIG 142 03/23/2020 1333   HDL 54 03/23/2020 1333   CHOLHDL 3.3 03/23/2020 1333  VLDL 28 03/23/2020 1333   LDLCALC 96 03/23/2020 1333    Additional studies/ records that were reviewed today include:  Echo 03/26/13:- Left ventricle: The cavity size was normal. Wall thickness was normal. Systolic function was mildly reduced. The estimated ejection fraction was in the range of 45% to 50%. There is hypokinesis of the basal-mid inferior posterior myocardium. Doppler parameters are consistent with abnormal left ventricular relaxation (grade 1 diastolic dysfunction). - Aortic valve: Mild regurgitation. - Mitral valve: Mild regurgitation. - Pulmonary arteries: PA peak pressure: 57mm Hg (S).  Echo 09/01/20: IMPRESSIONS    1. Left ventricular ejection fraction, by estimation, is 45 to 50%. The  left ventricle has mildly decreased function. The left ventricle  demonstrates regional wall motion abnormalities (see scoring  diagram/findings for description). There is mild  concentric left ventricular hypertrophy. Left ventricular diastolic  parameters are consistent with Grade I diastolic dysfunction (impaired  relaxation). Elevated left atrial pressure.  2. Right ventricular systolic function is normal. The right ventricular  size is mildly enlarged. There is moderately elevated pulmonary artery  systolic pressure. The estimated right ventricular systolic pressure is  46.4 mmHg.  3. Left atrial size was mildly dilated.  4. Right atrial size was moderately dilated.  5. The mitral valve is normal in structure. Mild to moderate mitral valve  regurgitation. No evidence of mitral stenosis.  6. Tricuspid valve regurgitation is moderate.  7. The aortic valve is normal in structure. Aortic valve regurgitation is  mild. Mild  to moderate aortic valve sclerosis/calcification is present,  without any evidence of aortic stenosis.  8. The inferior vena cava is normal in size with <50% respiratory  variability, suggesting right atrial pressure of 8 mmHg.   ASSESSMENT:    1. Coronary artery disease involving coronary bypass graft of native heart without angina pectoris   2. Chronic systolic heart failure (HCC)   3. Pure hypercholesterolemia   4. Essential hypertension      PLAN:  In order of problems listed above:  1. CAD - Patient continues to do very well now 25 years post CABG with RIMA and LIMA grafts. He is asymptomatic. Recommend continued medical therapy with risk factor modification.  2. HTN- BP is under excellent control.  Still on  ACEi and very low dose Toprol. 3. HLD - On statin therapy. Patient wanted to stop his statin but he should continue. Ideally LDL should be < 70.   4. Mild LV dysfunction. EF stable 45-50%. Continue ACEi and low dose metoprolol. No evidence of volume overload. 5. IPF. Now being evaluated by Dr Isaiah Serge.  I will follow up in one year.    Medication Adjustments/Labs and Tests Ordered: Current medicines are reviewed at length with the patient today.  Concerns regarding medicines are outlined above.  Medication changes, Labs and Tests ordered today are listed in the Patient Instructions below. There are no Patient Instructions on file for this visit.   Signed, Payten Hobin [place], MD  10/09/2020 11:08 AM    [facility] [address] [place], [place], [address] [phone]

## 2020-10-08 ENCOUNTER — Encounter: Payer: Self-pay | Admitting: Pulmonary Disease

## 2020-10-08 ENCOUNTER — Other Ambulatory Visit: Payer: Self-pay

## 2020-10-08 ENCOUNTER — Ambulatory Visit: Payer: Medicare Other | Admitting: Pulmonary Disease

## 2020-10-08 VITALS — BP 122/80 | HR 57 | Ht 70.0 in | Wt 156.4 lb

## 2020-10-08 DIAGNOSIS — J84112 Idiopathic pulmonary fibrosis: Secondary | ICD-10-CM

## 2020-10-08 NOTE — Patient Instructions (Signed)
I have reviewed your scan which shows some scarring in the lung.  A condition called pulmonary fibrosis which may progress over time We will get some labs today for further investigation and schedule pulmonary function test We will also give you a questionnaire to check for exposures Return to clinic in 1 month for review and plan for next steps

## 2020-10-08 NOTE — Progress Notes (Signed)
Mitchell Fields    [id number]    [birth year]  Primary Care Physician:Shaw, Chrissie [name], MD  Referring Physician: Artis Delay, MD [address] [place],  [place] [address]  Chief complaint: Consult for interstitial lung disease, pulmonary fibrosis  HPI: 85 year old with history of monoclonal gammopathy, hypertension, hyperlipidemia, coronary artery disease Referred for evaluation of pulmonary fibrosis noted on recent CT chest  He follows with Dr. Bertis Ruddy for monoclonal gammopathy and had a CT chest abdomen pelvis for evaluation of unexplained weight loss.  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  He is scheduled for a colonoscopy later this month for work-up of weight loss.   Chief complaint is fatigue, chronic dyspnea on exertion which is worsening gradually over the past few years.  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  No mold, hot tub, Jacuzzi.  No feather pillows or comforters Smoking history: 10-pack-year smoker.  Quit in 1969 Travel history: Previously lived in [place] and [place]. [place].  No significant recent travel Relevant family history: Dad had lung cancer.  He was a smoker.   Outpatient Encounter Medications as of 10/08/2020  Medication Sig  . Ascorbic Acid (VITAMIN C) 100 MG tablet Take 100 mg by mouth 2 (two) times daily.   Marland Kitchen aspirin 81 MG tablet Take 81 mg by mouth daily.  Marland Kitchen atorvastatin (LIPITOR) 20 MG tablet Take 10 mg by mouth daily.   . B Complex Vitamins (VITAMIN B COMPLEX PO) Take 1 tablet by mouth daily.  . Cholecalciferol (VITAMIN D PO) Take 1,000 Units by mouth daily.  Marland Kitchen gabapentin (NEURONTIN) 300 MG capsule Take 300 mg by mouth daily as needed.  Marland Kitchen HYDROcodone-acetaminophen (NORCO/VICODIN) 5-325 MG tablet Take 1 tablet by mouth every 6 (six) hours as needed for moderate pain.  Marland Kitchen LYRICA 75 MG  capsule   . metoprolol succinate (TOPROL-XL) 25 MG 24 hr tablet Take 75 mg by mouth. Pt takes 12.5 mg every other day and 25 mg on opposite days.  Marland Kitchen SYNTHROID 75 MCG tablet Take 75 mcg by mouth daily before breakfast.   . [DISCONTINUED] amoxicillin (AMOXIL) 500 MG tablet Take 1 tablet (500 mg total) by mouth 2 (two) times daily.  . [DISCONTINUED] quinapril (ACCUPRIL) 20 MG tablet Take 20 mg by mouth every morning.    No facility-administered encounter medications on file as of 10/08/2020.    Allergies as of 10/08/2020 - Review Complete 10/08/2020  Allergen Reaction Noted  . Cortisone Other (See Comments) 02/26/2009    Past Medical History:  Diagnosis Date  . Anemia   . Arthritis   . Back pain   . Bursitis of left hip   . Coronary artery disease    TWO VESSEL BYPASS SURGERY in January 1995  . Diverticulosis   . Hyperlipidemia   . Hypertension   . Monoclonal gammopathy   . Neuropathy     Past Surgical History:  Procedure Laterality Date  . ADENOIDECTOMY    . BACK SURGERY  1985, 1995, 2002   X3  . CORONARY ARTERY BYPASS GRAFT     X2. LIMA GRAFT AND A RIMA GRAFT.  Marland Kitchen INGUINAL HERNIA REPAIR Left   . Negative stress echo  08/11/2009  . US ECHOCARDIOGRAPHY  June 2012   Normal EF, mild aortic regurgitation, mild MR, LAE, mildly dilated RV, RAE, increased left atrial pressure, moderate to severe TR  and mildly increased  PA systolic pressure.     Family History  Problem Relation Age of Onset  . Lung cancer Father 7       smoker  . Diabetes Father   . Cancer Sister        breast in one; "bone" cancer in other    Social History   Socioeconomic History  . Marital status: Married    Spouse name: Not on file  . Number of children: 2  . Years of education: Not on file  . Highest education level: Not on file  Occupational History  . 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  [employer]  Tobacco Use  . Smoking status: Former Smoker    Packs/day:  2.00    Years: 5.00    Pack years: 10.00    Quit date: 10/04/1967    Years since quitting: 53.0  . Smokeless tobacco: Never Used  Substance and Sexual Activity  . Alcohol use: Yes    Alcohol/week: 7.0 standard drinks    Types: 7 Shots of liquor per week  . Drug use: No  . Sexual activity: Not on file  Other Topics Concern  . Not on file  Social History Narrative  . Not on file   Social Determinants of Health   Financial Resource Strain: Not on file  Food Insecurity: Not on file  Transportation Needs: Not on file  Physical Activity: Not on file  Stress: Not on file  Social Connections: Not on file  Intimate Partner Violence: Not on file    Review of systems: Review of Systems  Constitutional: Negative for fever and chills.  HENT: Negative.   Eyes: Negative for blurred vision.  Respiratory: as per HPI  Cardiovascular: Negative for chest pain and palpitations.  Gastrointestinal: Negative for vomiting, diarrhea, blood per rectum. Genitourinary: Negative for dysuria, urgency, frequency and hematuria.  Musculoskeletal: Negative for myalgias, back pain and joint pain.  Skin: Negative for itching and rash.  Neurological: Negative for dizziness, tremors, focal weakness, seizures and loss of consciousness.  Endo/Heme/Allergies: Negative for environmental allergies.  Psychiatric/Behavioral: Negative for depression, suicidal ideas and hallucinations.  All other systems reviewed and are negative.  Physical Exam: Blood pressure 122/80, pulse (!) 57, height 5\' 10"  (1.778 m), weight 156 lb 6.4 oz (70.9 kg), SpO2 99 %. Gen:      No acute distress HEENT:  EOMI, sclera anicteric Neck:     No masses; no thyromegaly Lungs:   Bibasal crackles CV:         Regular rate and rhythm; no murmurs Abd:      + bowel sounds; soft, non-tender; no palpable masses, no distension Ext:    No edema; adequate peripheral perfusion Skin:      Warm and dry; no rash Neuro: alert and oriented x 3 Psych:  normal mood and affect  Data Reviewed: Imaging: CT chest 03/20/2015-emphysema, subpleural reticulation CT chest 08/21/2020-basilar predominant fibrotic ILD, probable UIP pattern.  Progressive since 2016.  Diverticulosis in the abdomen, atherosclerosis. I have reviewed the images personally.  PFTs:  Labs:  Assessment:  Pulmonary fibrosis Progressive in nature with probable UIP pattern.  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

## 2020-10-09 ENCOUNTER — Encounter: Payer: Self-pay | Admitting: Cardiology

## 2020-10-09 ENCOUNTER — Ambulatory Visit (INDEPENDENT_AMBULATORY_CARE_PROVIDER_SITE_OTHER): Payer: Medicare Other | Admitting: Cardiology

## 2020-10-09 VITALS — BP 100/58 | HR 60 | Ht 70.0 in | Wt 157.6 lb

## 2020-10-09 DIAGNOSIS — I10 Essential (primary) hypertension: Secondary | ICD-10-CM | POA: Diagnosis not present

## 2020-10-09 DIAGNOSIS — I25810 Atherosclerosis of coronary artery bypass graft(s) without angina pectoris: Secondary | ICD-10-CM

## 2020-10-09 DIAGNOSIS — E7800 Pure hypercholesterolemia, unspecified: Secondary | ICD-10-CM

## 2020-10-09 DIAGNOSIS — I5022 Chronic systolic (congestive) heart failure: Secondary | ICD-10-CM | POA: Diagnosis not present

## 2020-10-11 LAB — SJOGREN'S SYNDROME ANTIBODS(SSA + SSB)
SSA (Ro) (ENA) Antibody, IgG: <1 AI
SSB (La) (ENA) Antibody, IgG: <1 AI

## 2020-10-11 LAB — ANTI-SCLERODERMA ANTIBODY: Scleroderma (Scl-70) (ENA) Antibody, IgG: <1 AI

## 2020-10-11 LAB — ANA,IFA RA DIAG PNL W/RFLX TIT/PATN
Anti Nuclear Antibody (ANA): NEGATIVE
Cyclic Citrullin Peptide Ab: 120 UNITS — ABNORMAL HIGH
Rheumatoid fact SerPl-aCnc: <14 IU/mL (ref <14)

## 2020-10-11 LAB — ANCA SCREEN W REFLEX TITER: ANCA Screen: NEGATIVE

## 2020-10-12 LAB — HYPERSENSITIVITY PNEUMONITIS
A. Pullulans Abs: NEGATIVE
A.Fumigatus #1 Abs: NEGATIVE
Micropolyspora faeni, IgG: NEGATIVE
Pigeon Serum Abs: NEGATIVE
Thermoact. Saccharii: NEGATIVE
Thermoactinomyces vulgaris, IgG: NEGATIVE

## 2020-10-14 ENCOUNTER — Other Ambulatory Visit: Payer: Self-pay | Admitting: *Deleted

## 2020-10-14 DIAGNOSIS — R768 Other specified abnormal immunological findings in serum: Secondary | ICD-10-CM

## 2020-10-14 DIAGNOSIS — R7989 Other specified abnormal findings of blood chemistry: Secondary | ICD-10-CM

## 2020-10-21 LAB — MYOMARKER 3 PLUS PROFILE (RDL)

## 2020-10-26 ENCOUNTER — Encounter: Payer: Self-pay | Admitting: Gastroenterology

## 2020-10-26 ENCOUNTER — Ambulatory Visit: Payer: Medicare Other | Admitting: Gastroenterology

## 2020-10-26 VITALS — BP 112/72 | HR 55 | Ht 70.0 in | Wt 158.2 lb

## 2020-10-26 DIAGNOSIS — R634 Abnormal weight loss: Secondary | ICD-10-CM

## 2020-10-26 DIAGNOSIS — J84112 Idiopathic pulmonary fibrosis: Secondary | ICD-10-CM | POA: Diagnosis not present

## 2020-10-26 NOTE — Progress Notes (Signed)
Mitchell Fields    [id number]    03-Oct-1936  Primary Care Physician:Shaw, Gwyndolyn Saxon, MD  Referring Physician: Marton Redwood, MD [address] [place],  [place] [address]   Chief complaint:  Weight loss  HPI:  85 year old very pleasant gentleman here for new patient visit to discuss colonoscopy for evaluation of unintentional weight loss.  3-4 months ago after he returned from a trip to [place] in [place], he had significant fatigue and weight loss, he has since gained back the weight.  His weight is currently back at his baseline Denies any nausea, vomiting, abdominal pain, melena or bright red blood per rectum Denies any change in appetite, dysphagia or change in bowel habits  He is undergoing work-up for possible pulmonary fibrosis and also has monoclonal gammopathy and CAD.  CT chest abdomen and pelvis with contrast 08/21/2020: Basilar predominant fibrotic interstitial lung disease with interval progression of idiopathic pulmonary fibrosis. Diverticulosis with no acute diverticulitis.  No mass lesion or lymphadenopathy suspicious for neoplasm EGD 01/04/2013 by Dr. Deatra Ina: Esophageal stricture s/p dilation with 62 French Maloney  Colonoscopy 03/12/2009 by Dr. Deatra Ina for average risk colorectal cancer screening: Sigmoid diverticulosis and internal hemorrhoids otherwise normal exam.  Outpatient Encounter Medications as of 10/26/2020  Medication Sig  . Ascorbic Acid (VITAMIN C) 100 MG tablet Take 100 mg by mouth 2 (two) times daily.   Marland Kitchen aspirin 81 MG tablet Take 81 mg by mouth daily.  Marland Kitchen atorvastatin (LIPITOR) 20 MG tablet Take 10 mg by mouth daily.   . B Complex Vitamins (VITAMIN B COMPLEX PO) Take 1 tablet by mouth daily.  . Cholecalciferol (VITAMIN D PO) Take 1,000 Units by mouth daily.  Marland Kitchen gabapentin (NEURONTIN) 300 MG capsule Take 300 mg by mouth daily as needed.  Marland Kitchen HYDROcodone-acetaminophen (NORCO/VICODIN) 5-325 MG tablet Take 1 tablet by mouth every 6 (six) hours as needed  for moderate pain.  . metoprolol succinate (TOPROL-XL) 25 MG 24 hr tablet Take 75 mg by mouth. Pt takes 12.5 mg every other day and 25 mg on opposite days.  Marland Kitchen SYNTHROID 75 MCG tablet Take 75 mcg by mouth daily before breakfast.   . [DISCONTINUED] LYRICA 75 MG capsule  (Patient not taking: Reported on 10/26/2020)   No facility-administered encounter medications on file as of 10/26/2020.    Allergies as of 10/26/2020 - Review Complete 10/26/2020  Allergen Reaction Noted  . Cortisone Other (See Comments) 02/26/2009    Past Medical History:  Diagnosis Date  . Anemia   . Arthritis   . Back pain   . Bursitis of left hip   . Coronary artery disease    TWO VESSEL BYPASS SURGERY in January 1995  . Diverticulosis   . Hyperlipidemia   . Hypertension   . Monoclonal gammopathy   . Neuropathy     Past Surgical History:  Procedure Laterality Date  . ADENOIDECTOMY    . [place], 2002   X3  . COLONOSCOPY    . CORONARY ARTERY BYPASS GRAFT     X2. LIMA GRAFT AND A RIMA GRAFT.  Marland Kitchen INGUINAL HERNIA REPAIR Left   . Negative stress echo  08/11/2009  . US ECHOCARDIOGRAPHY  June 2012   Normal EF, mild aortic regurgitation, mild MR, LAE, mildly dilated RV, RAE, increased left atrial pressure, moderate to severe TR and mildly increased  PA systolic pressure.     Family History  Problem Relation Age of Onset  . Lung cancer  Father 77       smoker  . Diabetes Father   . Cancer Sister        breast in one; "bone" cancer in other    Social History   Socioeconomic History  . Marital status: Married    Spouse name: Not on file  . Number of children: 2  . Years of education: Not on file  . Highest education level: Not on file  Occupational History  . 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  [employer]  Tobacco Use  . Smoking status: Former Smoker    Packs/day: 2.00    Years: 5.00    Pack years: 10.00    Quit date: 10/04/1967    Years since  quitting: 53.0  . Smokeless tobacco: Never Used  Substance and Sexual Activity  . Alcohol use: Yes    Alcohol/week: 7.0 standard drinks    Types: 7 Shots of liquor per week  . Drug use: No  . Sexual activity: Not Currently  Other Topics Concern  . Not on file  Social History Narrative  . Not on file   Social Determinants of Health   Financial Resource Strain: Not on file  Food Insecurity: Not on file  Transportation Needs: Not on file  Physical Activity: Not on file  Stress: Not on file  Social Connections: Not on file  Intimate Partner Violence: Not on file      Review of systems: All other review of systems negative except as mentioned in the HPI.   Physical Exam: Vitals:   10/26/20 1347  BP: 112/72  Pulse: (!) 55  SpO2: 100%   Body mass index is 22.7 kg/m. Gen:      No acute distress HEENT:  sclera anicteric Abd:      soft, non-tender; no palpable masses, no distension Ext:    No edema Neuro: alert and oriented x 3 Psych: normal mood and affect  Data Reviewed:  Reviewed labs, radiology imaging, old records and pertinent past GI work up   Assessment and Plan/Recommendations:  85 year old gentleman with history of CAD, hypertension, hyperlipidemia, monoclonal gammopathy and possible pulmonary fibrosis here for evaluation of unintentional weight loss  Weight loss: Unclear etiology, he has since regained his weight He has no specific GI symptoms Reviewed recent CT abdomen pelvis with no acute pathology or high risk lesions  Will hold off endoscopic evaluation or colonoscopy for colorectal cancer screening now given lack of symptoms and no significant added benefit compared to potential risks, he is average risk with no family history of colon cancer or polyps  Advised patient to call with any change in symptoms  Return as needed   The patient was provided an opportunity to ask questions and all were answered. The patient agreed with the plan and  demonstrated an understanding of the instructions.  Damaris Hippo , MD    CC: Marton Redwood, MD

## 2020-10-26 NOTE — Patient Instructions (Addendum)
Follow up as needed   If you are age 83 or older, your body mass index should be between 23-30. Your Body mass index is 22.7 kg/m. If this is out of the aforementioned range listed, please consider follow up with your Primary Care Provider.  If you are age 64 or younger, your body mass index should be between 19-25. Your Body mass index is 22.7 kg/m. If this is out of the aformentioned range listed, please consider follow up with your Primary Care Provider.    Due to recent changes in healthcare laws, you may see the results of your imaging and laboratory studies on MyChart before your provider has had a chance to review them.  We understand that in some cases there may be results that are confusing or concerning to you. Not all laboratory results come back in the same time frame and the provider may be waiting for multiple results in order to interpret others.  Please give [place] 48 hours in order for your provider to thoroughly review all the results before contacting the office for clarification of your results.   I appreciate the  opportunity to care for you  Thank You   Harl Bowie , MD

## 2020-11-18 ENCOUNTER — Ambulatory Visit: Payer: Medicare Other | Admitting: Pulmonary Disease

## 2020-11-18 ENCOUNTER — Encounter: Payer: Self-pay | Admitting: Pulmonary Disease

## 2020-11-18 ENCOUNTER — Other Ambulatory Visit: Payer: Self-pay

## 2020-11-18 VITALS — BP 128/66 | HR 60 | Temp 97.7°F | Ht 70.0 in | Wt 159.6 lb

## 2020-11-18 DIAGNOSIS — J84112 Idiopathic pulmonary fibrosis: Secondary | ICD-10-CM | POA: Diagnosis not present

## 2020-11-18 NOTE — Patient Instructions (Signed)
I have reviewed your labs which show an elevation in impractical CCP which may indicate rheumatoid arthritis causing lung disease. We have already referred you to Dr. Benjamine Mola, rheumatology for assessment We will schedule PFTs and follow-up in clinic in 1 month for reassessment and plan for next steps.

## 2020-11-18 NOTE — Progress Notes (Signed)
Mitchell Fields    [id number]    January 08, 1936  Primary Care Physician:Shaw, Gwyndolyn Saxon, MD  Referring Physician: Marton Redwood, MD [address] [place],   [address]  Chief complaint: Follow-up for interstitial lung disease, pulmonary fibrosis  HPI: 85 year old with history of monoclonal gammopathy, hypertension, hyperlipidemia, coronary artery disease Referred for evaluation of pulmonary fibrosis noted on recent CT chest  He follows with Dr. Alvy Bimler for monoclonal gammopathy and had a CT chest abdomen pelvis for evaluation of unexplained weight loss.  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  He is scheduled for a colonoscopy later this month for work-up of weight loss.   Chief complaint is fatigue, chronic dyspnea on exertion which is worsening gradually over the past few years.  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  Later worked in [employer] Exposures: Exposure as noted above.  No mold, hot tub, Jacuzzi.  No feather pillows or comforters Smoking history: 10-pack-year smoker.  Quit in 1969 Travel history: Previously lived in [place] and [place].  No significant recent travel Relevant family history: Dad had lung cancer.  He was a smoker.  Interim history:  Here for follow-up of interstitial lung disease noted on CT scan. States that breathing is doing fine with no issues. He had PFTs ordered at last visit but not done yet  Serologies reveal high CCP and he has been referred to rheumatology   Outpatient Encounter Medications as of 11/18/2020  Medication Sig  . Ascorbic Acid (VITAMIN C) 100 MG tablet Take 100 mg by mouth 2 (two) times daily.   Marland Kitchen aspirin 81 MG tablet Take 81 mg by mouth daily.  Marland Kitchen atorvastatin (LIPITOR) 20 MG tablet Take 10 mg by mouth daily.   . B Complex Vitamins (VITAMIN B COMPLEX PO) Take 1 tablet by mouth daily.  . Cholecalciferol (VITAMIN D PO) Take 1,000  Units by mouth daily.  Marland Kitchen gabapentin (NEURONTIN) 300 MG capsule Take 300 mg by mouth daily as needed.  Marland Kitchen HYDROcodone-acetaminophen (NORCO/VICODIN) 5-325 MG tablet Take 1 tablet by mouth every 6 (six) hours as needed for moderate pain.  . metoprolol succinate (TOPROL-XL) 25 MG 24 hr tablet Take 75 mg by mouth. Pt takes 12.5 mg every other day and 25 mg on opposite days.  Marland Kitchen SYNTHROID 75 MCG tablet Take 75 mcg by mouth daily before breakfast.    No facility-administered encounter medications on file as of 11/18/2020.    Physical Exam: Blood pressure 128/66, pulse 60, temperature 97.7 F (36.5 C), temperature source Temporal, height 5\' 10"  (1.778 m), weight 159 lb 9.6 oz (72.4 kg), SpO2 96 %. Gen:      No acute distress HEENT:  EOMI, sclera anicteric Neck:     No masses; no thyromegaly Lungs:    Clear to auscultation bilaterally; normal respiratory effort CV:         Regular rate and rhythm; no murmurs Abd:      + bowel sounds; soft, non-tender; no palpable masses, no distension Ext:    No edema; adequate peripheral perfusion Skin:      Warm and dry; no rash Neuro: alert and oriented x 3 Psych: normal mood and affect  Data Reviewed: Imaging: CT chest 03/20/2015-emphysema, subpleural reticulation CT chest 08/21/2020-basilar predominant fibrotic ILD, probable UIP pattern.  Progressive since 2016.  Diverticulosis in the abdomen, atherosclerosis. I have reviewed the images personally.  PFTs:  Labs: CTD serologies 10/08/2020-significant  for CCP 120. Rest of the labs are negative  Assessment:  Pulmonary fibrosis Progressive in nature with probable UIP pattern.  This is likely IPF There is no significant exposure history or symptoms of connective tissue disease though CCP is markedly elevated He has an appointment with Dr. Benjamine Mola from rheumatology next week  If he does not have rheumatoid arthritis then will discuss antifibrotic therapy PFTs reordered again as they have not been completed  yet.  Plan/Recommendations: Awaiting rheumatology eval PFTs  [name] MD [facility] 11/18/2020, 11:58 AM  CC: Marton Redwood, MD

## 2020-11-20 ENCOUNTER — Ambulatory Visit: Payer: Medicare Other | Admitting: Cardiology

## 2020-11-23 NOTE — Progress Notes (Unsigned)
Office Visit Note  Patient: Mitchell Fields             Date of Birth: Dec 29, 1935           MRN: [id number]             PCP: Marton Redwood, MD Referring: [name], MD Visit Date: 11/24/2020 Occupation: Retired Social research officer, government  Subjective:  New Patient (Initial Visit) (Patient was recently diagnosed with IPF. )   History of Present Illness: Mitchell Fields is a 85 y.o. male with history of CAD, rotator cuff tendinopathy, monoclonal gammopathy, HLD, HTN here for evaluation of positive CCP Abs with new diagnosis of ILD. He was diagnosed with ILD during evaluation of unexplained weight loss in the past year with abnormal respiratory sounds and chest imaging. He did not notice significant dyspnea until having some on exertion while vacationing in higher [place]. Currently no problems at rest but dyspnea with exertion quickly. He had chronic back pain previously but doing well recently since reducing his golfing frequency. Currently no joint pain complaints, no stiffness, no swelling. No rashes, no painful eye inflammation. He is not aware of a family history of rheumatoid arthritis.   Labs reviewed 10/2020 ANA neg RF neg CCP 120 MyoMarker 3 Panel neg SSA/SSB neg ANCA neg Scl-70 Ab neg CMP - eGFR 60 CBC - Hgb 11.9  Imaging reviewed 08/2020 CT Chest/Abdomen/Pelvis  IMPRESSION: 1. Spectrum of findings compatible with basilar predominant fibrotic interstitial lung disease without frank honeycombing, clearly progressed in the interval. Findings are categorized as probable UIP per consensus guidelines: Diagnosis of Idiopathic Pulmonary Fibrosis: An Official ATS/ERS/JRS/ALAT Clinical Practice Guideline. [place], Iss 5, [phone], Jun 03 2017.  2. No lymphadenopathy or other findings suspicious for neoplastic disease. 3. Moderate diverticulosis of the terminal ileum and marked diverticulosis of the left colon, without acute diverticulitis. 4. Aberrant right  subclavian artery. 5. Aortic Atherosclerosis (ICD10-I70.0).   Activities of Daily Living:  Patient reports morning stiffness for 0 minutes.   Patient Denies nocturnal pain.  Difficulty dressing/grooming: Denies Difficulty climbing stairs: Denies Difficulty getting out of chair: Denies Difficulty using hands for taps, buttons, cutlery, and/or writing: Denies  Review of Systems  Constitutional: Negative for fatigue.  HENT: Negative for mouth sores, mouth dryness and nose dryness.   Eyes: Positive for visual disturbance. Negative for pain, itching and dryness.  Respiratory: Negative for cough, hemoptysis, shortness of breath and difficulty breathing.   Cardiovascular: Negative for chest pain, palpitations and swelling in legs/feet.  Gastrointestinal: Negative for abdominal pain, blood in stool, constipation and diarrhea.  Endocrine: Negative for increased urination.  Genitourinary: Negative for painful urination.  Musculoskeletal: Positive for muscle weakness. Negative for arthralgias, joint pain, joint swelling, myalgias, morning stiffness, muscle tenderness and myalgias.  Skin: Positive for color change and redness. Negative for rash.  Allergic/Immunologic: Negative for susceptible to infections.  Neurological: Positive for numbness and parasthesias. Negative for dizziness, headaches, memory loss and weakness.  Hematological: Negative for swollen glands.  Psychiatric/Behavioral: Negative for confusion and sleep disturbance.    PMFS History:  Patient Active Problem List   Diagnosis Date Noted  . Positive anti-CCP test 11/24/2020  . Hand deformity 11/24/2020  . Pulmonary fibrosis ([place]) 08/24/2020  . Diverticulosis of colon 08/24/2020  . Melena 08/14/2020  . Cough in adult 08/14/2020  . Left rotator cuff tear arthropathy 09/16/2019  . Other fatigue 03/06/2019  . Alcoholic peripheral neuropathy ([place]) 03/12/2018  . Sciatica, right side 10/11/2017  .  Acute pain of right shoulder  03/02/2017  . Impingement syndrome of right shoulder 03/02/2017  . Anemia due to chronic illness 03/13/2014  . Monoclonal gammopathy   . Lung nodules 03/11/2013  . Weight loss, non-intentional 01/28/2013  . CAD (coronary artery disease) 08/03/2011  . HTN (hypertension) 08/03/2011  . Hyperlipidemia 08/03/2011  . Valvular heart disease 08/03/2011    Past Medical History:  Diagnosis Date  . Anemia   . Arthritis   . Back pain   . Bursitis of left hip   . Coronary artery disease    TWO VESSEL BYPASS SURGERY in January 1995  . Diverticulosis   . Hyperlipidemia   . Hypertension   . Monoclonal gammopathy   . Neuropathy     Family History  Problem Relation Age of Onset  . Lung cancer Father 61       smoker  . Diabetes Father   . Cancer Sister        breast in one; "bone" cancer in other   Past Surgical History:  Procedure Laterality Date  . ADENOIDECTOMY    . [place], 2002   X3  . COLONOSCOPY    . CORONARY ARTERY BYPASS GRAFT     X2. LIMA GRAFT AND A RIMA GRAFT.  [facility] INGUINAL HERNIA REPAIR Left   . Negative stress echo  08/11/2009  . US ECHOCARDIOGRAPHY  June 2012   Normal EF, mild aortic regurgitation, mild MR, LAE, mildly dilated RV, RAE, increased left atrial pressure, moderate to severe TR and mildly increased  PA systolic pressure.    Social History   Social History Narrative  . Not on file   Immunization History  Administered Date(s) Administered  . Fluad Quad(high Dose 65+) 06/15/2019  . Influenza, High Dose Seasonal PF 06/23/2018, 06/03/2020  . Influenza,inj,Quad PF,6+ Mos 07/20/2013  . Influenza-Unspecified 06/23/2018  . Moderna Sars-Covid-2 Vaccination 10/17/2019, 11/15/2019, 07/29/2020  . Tetanus 05/20/2013     Objective: Vital Signs: BP 121/70 (BP Location: Right Arm, Patient Position: Sitting, Cuff Size: Normal)   Pulse 75   Ht 5' 9"  (1.753 m)   Wt 158 lb 6.4 oz (71.8 kg)   BMI 23.39 kg/m    Physical Exam HENT:     Right Ear:  External ear normal.     Left Ear: External ear normal.     Mouth/Throat:     Mouth: Mucous membranes are moist.     Pharynx: Oropharynx is clear.  Eyes:     Conjunctiva/sclera: Conjunctivae normal.  Cardiovascular:     Rate and Rhythm: Normal rate and regular rhythm.  Pulmonary:     Effort: Pulmonary effort is normal.     Comments: Bibasilar inspiratory crackles with good air movement Skin:    General: Skin is warm and dry.     Findings: No rash.  Neurological:     General: No focal deficit present.     Mental Status: He is alert.  Psychiatric:        Mood and Affect: Mood normal.     Musculoskeletal Exam:  Neck full ROM no tenderness Shoulders full ROM no tenderness or swelling Elbows full ROM no tenderness or swelling Right wrist range of motion partially reduced, left wrist normal, no tenderness or swelling  Fingers full ROM no tenderness or swelling, second MCP with subluxation and ulnar deviation and some interosseous muscle wasting, few distal Heberden's nodes in second and third DIPs Knees full ROM no tenderness or swelling Ankles full ROM no tenderness  or swelling MTPs no squeeze tenderness, decreased range of motion on right side, no swelling   Investigation: No additional findings.  Imaging: XR Hand 2 View Left  Result Date: 11/24/2020 X-ray left hand 2 views Radiocarpal joint space intact.  Avulsion versus ossicle at ulnar styloid.  Calcification of triangular fibrocartilage.  Mild first CMC joint arthritis with some subluxation.  Second MCP joint subluxation third and fourth MCPs peripheral osteophyte formation.  PIPs are normal.  DIPs second and third with asymmetric joint space narrowing and subchondral sclerosis.  Probable generalized osteopenia.  No soft tissue swelling seen. Impression Degenerative arthritis changes with no erosive disease, generalized osteopenia  XR Hand 2 View Right  Result Date: 11/24/2020 X-ray right hand 2 views Radiocarpal joint  space narrowing.  Triangular fibrocartilage calcification.  Mild first CMC degenerative changes without subluxation.  MCP joints appear normal.  PIPs are normal.  Asymmetric joint space narrowing at second third and fifth DIPs.  Probable generalized osteopenia.  No soft tissue swelling seen. Impression Mild degenerative arthritis changes approximately distal joints and probable generalized osteopenia   Recent Labs: Lab Results  Component Value Date   WBC 7.2 08/21/2020   HGB 11.9 (L) 08/21/2020   PLT 285 08/21/2020   NA 138 08/21/2020   K 4.5 08/21/2020   CL 102 08/21/2020   CO2 28 08/21/2020   GLUCOSE 118 (H) 08/21/2020   BUN 21 08/21/2020   CREATININE 1.22 08/21/2020   BILITOT 0.5 08/21/2020   ALKPHOS 61 08/21/2020   AST 19 08/21/2020   ALT 17 08/21/2020   PROT 7.5 08/21/2020   ALBUMIN 3.8 08/21/2020   CALCIUM 9.2 08/21/2020   GFRAA >60 03/23/2020    Speciality Comments: No specialty comments available.  Procedures:  No procedures performed Allergies: Cortisone   Assessment / Plan:     Visit Diagnoses: Positive anti-CCP test - Plan: XR Hand 2 View Right, XR Hand 2 View Left  Positive CCP Abs but no clinical evidence of systemic connective tissue disease at this time. Bilateral hand xrays checked do not show chronic erosive changes to suggest previous disease activity.  Pulmonary fibrosis ([place])  UIP pattern ILD recently diagnosed not clear if progressing or stable currently. No autoimmune features seen at this time to indicate CTD related disease.  Deformity of hand, unspecified laterality - Plan: XR Hand 2 View Right, XR Hand 2 View Left  Osteoarthritis changes in bilateral hands with mild deformity no erosions and no synovitis on exam and xrays today.  Orders: Orders Placed This Encounter  Procedures  . XR Hand 2 View Right  . XR Hand 2 View Left   No orders of the defined types were placed in this encounter.   Follow-Up Instructions: No follow-ups on  file.   Collier Salina, MD  Note - This record has been created using [employer].  Chart creation errors have been sought, but may not always  have been located. Such creation errors do not reflect on  the standard of medical care.

## 2020-11-24 ENCOUNTER — Ambulatory Visit: Payer: Self-pay

## 2020-11-24 ENCOUNTER — Other Ambulatory Visit: Payer: Self-pay

## 2020-11-24 ENCOUNTER — Ambulatory Visit: Payer: Medicare Other | Admitting: Internal Medicine

## 2020-11-24 ENCOUNTER — Encounter: Payer: Self-pay | Admitting: Internal Medicine

## 2020-11-24 VITALS — BP 121/70 | HR 75 | Ht 69.0 in | Wt 158.4 lb

## 2020-11-24 DIAGNOSIS — M21941 Unspecified acquired deformity of hand, right hand: Secondary | ICD-10-CM | POA: Diagnosis not present

## 2020-11-24 DIAGNOSIS — M21949 Unspecified acquired deformity of hand, unspecified hand: Secondary | ICD-10-CM | POA: Insufficient documentation

## 2020-11-24 DIAGNOSIS — J8410 Pulmonary fibrosis, unspecified: Secondary | ICD-10-CM

## 2020-11-24 DIAGNOSIS — R768 Other specified abnormal immunological findings in serum: Secondary | ICD-10-CM | POA: Diagnosis not present

## 2020-11-24 DIAGNOSIS — R7681 Abnormal rheumatoid factor and anti-citrullinated protein antibody without rheumatoid arthritis: Secondary | ICD-10-CM | POA: Insufficient documentation

## 2020-12-25 ENCOUNTER — Ambulatory Visit: Payer: Medicare Other | Admitting: Cardiology

## 2020-12-29 ENCOUNTER — Other Ambulatory Visit (HOSPITAL_COMMUNITY)
Admission: RE | Admit: 2020-12-29 | Discharge: 2020-12-29 | Disposition: A | Discharge status: Home / Self Care | Payer: Medicare Other | Source: Ambulatory Visit | Attending: Pulmonary Disease | Admitting: Pulmonary Disease

## 2020-12-29 DIAGNOSIS — Z01812 Encounter for preprocedural laboratory examination: Secondary | ICD-10-CM | POA: Insufficient documentation

## 2020-12-29 DIAGNOSIS — Z20822 Contact with and (suspected) exposure to covid-19: Secondary | ICD-10-CM | POA: Diagnosis not present

## 2020-12-29 LAB — SARS CORONAVIRUS 2 (TAT 6-24 HRS): SARS Coronavirus 2: NEGATIVE

## 2020-12-31 ENCOUNTER — Ambulatory Visit: Payer: Medicare Other | Admitting: Cardiology

## 2021-01-01 ENCOUNTER — Ambulatory Visit: Payer: Medicare Other | Admitting: Pulmonary Disease

## 2021-01-01 ENCOUNTER — Ambulatory Visit (INDEPENDENT_AMBULATORY_CARE_PROVIDER_SITE_OTHER): Payer: Medicare Other | Admitting: Pulmonary Disease

## 2021-01-01 ENCOUNTER — Other Ambulatory Visit: Payer: Self-pay

## 2021-01-01 ENCOUNTER — Encounter: Payer: Self-pay | Admitting: Pulmonary Disease

## 2021-01-01 VITALS — BP 126/74 | HR 68 | Temp 97.8°F | Ht 69.0 in | Wt 160.0 lb

## 2021-01-01 DIAGNOSIS — Z79899 Other long term (current) drug therapy: Secondary | ICD-10-CM

## 2021-01-01 DIAGNOSIS — J84112 Idiopathic pulmonary fibrosis: Secondary | ICD-10-CM

## 2021-01-01 LAB — PULMONARY FUNCTION TEST
DL/VA % pred: 61 %
DL/VA: 2.38 ml/min/mmHg/L
DLCO cor % pred: 53 %
DLCO cor: 12.61 ml/min/mmHg
DLCO unc % pred: 53 %
DLCO unc: 12.61 ml/min/mmHg
FEF 25-75 Post: 1.85 L/sec
FEF 25-75 Pre: 1.2 L/sec
FEF2575-%Change-Post: 53 %
FEF2575-%Pred-Post: 103 %
FEF2575-%Pred-Pre: 67 %
FEV1-%Change-Post: 11 %
FEV1-%Pred-Post: 91 %
FEV1-%Pred-Pre: 81 %
FEV1-Post: 2.43 L
FEV1-Pre: 2.17 L
FEV1FVC-%Change-Post: 4 %
FEV1FVC-%Pred-Pre: 96 %
FEV6-%Change-Post: 6 %
FEV6-%Pred-Post: 95 %
FEV6-%Pred-Pre: 89 %
FEV6-Post: 3.36 L
FEV6-Pre: 3.15 L
FEV6FVC-%Change-Post: 0 %
FEV6FVC-%Pred-Post: 107 %
FEV6FVC-%Pred-Pre: 106 %
FVC-%Change-Post: 6 %
FVC-%Pred-Post: 89 %
FVC-%Pred-Pre: 84 %
FVC-Post: 3.38 L
FVC-Pre: 3.18 L
Post FEV1/FVC ratio: 72 %
Post FEV6/FVC ratio: 99 %
Pre FEV1/FVC ratio: 68 %
Pre FEV6/FVC Ratio: 99 %
RV % pred: 103 %
RV: 2.74 L
TLC % pred: 88 %
TLC: 6.07 L

## 2021-01-01 LAB — HEPATIC FUNCTION PANEL
ALT: 21 U/L (ref 0–53)
AST: 20 U/L (ref 0–37)
Albumin: 4.1 g/dL (ref 3.5–5.2)
Alkaline Phosphatase: 49 U/L (ref 39–117)
Bilirubin, Direct: 0.1 mg/dL (ref 0.0–0.3)
Total Bilirubin: 0.5 mg/dL (ref 0.2–1.2)
Total Protein: 7 g/dL (ref 6.0–8.3)

## 2021-01-01 NOTE — Progress Notes (Addendum)
Mitchell Fields    [id number]    06-23-1936  Primary Care Physician:Shaw, Emily Filbert., MD  Referring Physician: Ginger Organ., MD [address] [place],  [place] [address]  Chief complaint: Follow-up for interstitial lung disease, pulmonary fibrosis  HPI: 85 year old with history of monoclonal gammopathy, hypertension, hyperlipidemia, coronary artery disease Referred for evaluation of pulmonary fibrosis noted on recent CT chest  He follows with Mitchell Fields for monoclonal gammopathy and had a CT chest abdomen pelvis for evaluation of unexplained weight loss.  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  He is scheduled for a colonoscopy later this month for work-up of weight loss.   Chief complaint is fatigue, chronic dyspnea on exertion which is worsening gradually over the past few years.  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  Later worked in [employer] Exposures: Exposure as noted above.  No mold, hot tub, Jacuzzi.  No feather pillows or comforters ILD questionnaire 01/01/2021-negative except as noted above Smoking history: 10-pack-year smoker.  Quit in 1969 Travel history: Previously lived in [place] and [place].  No significant recent travel Relevant family history: Dad had lung cancer.  He was a smoker.  Interim history:  Here for follow-up of interstitial lung disease noted on CT scan. States that breathing is doing fine with no issues. He had a consultation with Mitchell Fields, rheumatology for elevated CCP.  No evidence of rheumatoid arthritis noted  Outpatient Encounter Medications as of 01/01/2021  Medication Sig   Ascorbic Acid (VITAMIN C) 100 MG tablet Take 100 mg by mouth 2 (two) times daily.    aspirin 81 MG tablet Take 81 mg by mouth daily.   atorvastatin (LIPITOR) 20 MG tablet Take 10 mg by mouth daily.    B Complex Vitamins (VITAMIN B COMPLEX PO) Take 1 tablet by  mouth daily.   Cholecalciferol (VITAMIN D PO) Take 1,000 Units by mouth daily.   Ferrous Sulfate (IRON PO) Take by mouth daily.   gabapentin (NEURONTIN) 300 MG capsule Take 300 mg by mouth daily as needed.   HYDROcodone-acetaminophen (NORCO/VICODIN) 5-325 MG tablet Take 1 tablet by mouth every 6 (six) hours as needed for moderate pain.   metoprolol succinate (TOPROL-XL) 25 MG 24 hr tablet Take 12.5 mg by mouth daily.   Misc Natural Products (GLUCOSAMINE CHOND COMPLEX/MSM) TABS take 1 po qd   Multiple Vitamins-Minerals (ZINC PO) Take by mouth daily.   pregabalin (LYRICA) 75 MG capsule as needed.   SYNTHROID 75 MCG tablet Take 75 mcg by mouth daily before breakfast.    No facility-administered encounter medications on file as of 01/01/2021.    Physical Exam: Blood pressure 126/74, pulse 68, temperature 97.8 F (36.6 C), temperature source Temporal, height 5\' 9"  (1.753 m), weight 160 lb (72.6 kg), SpO2 97 %. Gen:      No acute distress HEENT:  EOMI, sclera anicteric Neck:     No masses; no thyromegaly Lungs:    Bibasal crackles CV:         Regular rate and rhythm; no murmurs Abd:      + bowel sounds; soft, non-tender; no palpable masses, no distension Ext:    No edema; adequate peripheral perfusion Skin:      Warm and dry; no rash Neuro: alert and oriented x 3 Psych: normal mood and affect  Data Reviewed: Imaging: CT chest 03/20/2015-emphysema, subpleural reticulation CT chest 08/21/2020-basilar predominant  fibrotic ILD, probable UIP pattern.  Progressive since 2016.  Diverticulosis in the abdomen, atherosclerosis. I have reviewed the images personally.  PFTs: 01/01/2021 FVC 3.38 [89%], FEV1 2.43 [91%], F/F 72, TLC 6.07 [88%], DLCO 12.61 [53%] Moderate-severe diffusion defect  Labs: CTD serologies 10/08/2020-significant for CCP 120. Rest of the labs are negative  Cardiac: Echocardiogram 09/01/2020 LVEF 45-50%, mild concentric LVH, grade 1 diastolic dysfunction, moderately elevated  PA systolic pressure, RVSP 81.5  Assessment:  Pulmonary fibrosis Progressive in nature with probable UIP pattern.  This is likely IPF There is no evidence of connective tissue disease though CCP is markedly elevated Reviewed rheumatology appointment report  Reviewed treatment options in detail with patient and we have decided to start Admire for baseline liver assessment  Plan/Recommendations: Check comprehensive metabolic panel, start paperwork for Esbriet  Mitchell Garfinkel MD [facility] 01/01/2021, 11:17 AM  CC: Mitchell Organ., MD

## 2021-01-01 NOTE — Progress Notes (Signed)
PFT done today. 

## 2021-01-01 NOTE — Patient Instructions (Signed)
We will check LFTs and start you on a medication called Esbriet  Follow-up in 3 months

## 2021-01-27 ENCOUNTER — Encounter: Payer: Self-pay | Admitting: Pulmonary Disease

## 2021-01-28 ENCOUNTER — Encounter: Payer: Self-pay | Admitting: *Deleted

## 2021-01-28 NOTE — Progress Notes (Signed)
Letter mailed

## 2021-03-17 ENCOUNTER — Ambulatory Visit: Payer: Medicare Other | Admitting: Orthopaedic Surgery

## 2021-03-24 ENCOUNTER — Ambulatory Visit: Payer: Medicare Other | Admitting: Orthopaedic Surgery

## 2021-03-24 ENCOUNTER — Other Ambulatory Visit: Payer: Self-pay

## 2021-03-24 ENCOUNTER — Encounter: Payer: Self-pay | Admitting: Orthopaedic Surgery

## 2021-03-24 DIAGNOSIS — M25511 Pain in right shoulder: Secondary | ICD-10-CM | POA: Diagnosis not present

## 2021-03-24 DIAGNOSIS — G5601 Carpal tunnel syndrome, right upper limb: Secondary | ICD-10-CM

## 2021-03-24 DIAGNOSIS — G8929 Other chronic pain: Secondary | ICD-10-CM

## 2021-03-24 MED ORDER — METHYLPREDNISOLONE ACETATE 40 MG/ML IJ SUSP
40.0000 mg | INTRAMUSCULAR | Status: AC | PRN
  Administered 2021-03-24: 40 mg via INTRA_ARTICULAR

## 2021-03-24 MED ORDER — HYDROCODONE-ACETAMINOPHEN 5-325 MG PO TABS: 1.0000 | ORAL_TABLET | Freq: Four times a day (QID) | ORAL | 0 refills | Status: DC | PRN

## 2021-03-24 MED ORDER — LIDOCAINE HCL 1 % IJ SOLN
3.0000 mL | INTRAMUSCULAR | Status: AC | PRN
  Administered 2021-03-24: 3 mL

## 2021-03-24 NOTE — Progress Notes (Signed)
Office Visit Note   Patient: Mitchell Fields           Date of Birth: 25-Aug-1936           MRN: [id number] Visit Date: 03/24/2021              Requested by: Ginger Organ., MD [address] [place],  [place] [address] PCP: Ginger Organ., MD   Assessment & Plan: Visit Diagnoses: No diagnosis found.  Plan: I did provide a steroid injection in his right shoulder per his request.  I would like to send him to Dr. Milly Jakob a hand specialist with [facility].  The patient is requesting endoscopic carpal tunnel release and under the Dr. Grandville Silos does this type of surgery and has vast experience an endoscopic carpal tunnel releases.  Follow-Up Instructions: No follow-ups on file.   Orders:  No orders of the defined types were placed in this encounter.  No orders of the defined types were placed in this encounter.     Procedures: Large Joint Inj: R subacromial bursa on 03/24/2021 1:43 PM Indications: pain and diagnostic evaluation Details: 22 G 1.5 in needle  Arthrogram: No  Medications: 3 mL lidocaine 1 %; 40 mg methylPREDNISolone acetate 40 MG/ML Outcome: tolerated well, no immediate complications Procedure, treatment alternatives, risks and benefits explained, specific risks discussed. Consent was given by the patient. Immediately prior to procedure a time out was called to verify the correct patient, procedure, equipment, support staff and site/side marked as required. Patient was prepped and draped in the usual sterile fashion.      Clinical Data: No additional findings.   Subjective: Chief Complaint  Patient presents with   Right Hand - Numbness  The patient comes in today for right hand numbness tingling and weakness.  He is a long-term patient of mine.  He is 85 years old.  He has been told by other physicians over time that he has carpal tunnel syndrome.  He points to the median nerve distribution as a source of his numbness and pain.  He also  says the hand is getting weaker and is right-hand dominant.  He has been dealing with some right shoulder issues and is requesting a steroid injection in his right shoulder today.  He had no other acute change in medical status.  HPI  Review of Systems He currently denies any headache, chest pain, shortness of breath, fever, chills, nausea, vomiting  Objective: Vital Signs: There were no vitals taken for this visit.  Physical Exam He is alert and oriented x3 and in no acute distress Ortho Exam Examination of his right hand shows numbness in the median nerve distribution.  He does have weak pinch and grip strength.  He does have a positive Phalen's and Tinel's exam.  There is some pain over the medial elbow but not severe.  He does have pain on rotation of his right shoulder and obvious impingement of the right shoulder on exam. Specialty Comments:  No specialty comments available.  Imaging: No results found.   PMFS History: Patient Active Problem List   Diagnosis Date Noted   Positive anti-CCP test 11/24/2020   Hand deformity 11/24/2020   Pulmonary fibrosis ([place]) 08/24/2020   Diverticulosis of colon 08/24/2020   Melena 08/14/2020   Cough in adult 08/14/2020   Left rotator cuff tear arthropathy 09/16/2019   Other fatigue 38/93/7342   Alcoholic peripheral neuropathy ([place]) 03/12/2018   Sciatica, right side 10/11/2017   Acute pain  of right shoulder 03/02/2017   Impingement syndrome of right shoulder 03/02/2017   Anemia due to chronic illness 03/13/2014   Monoclonal gammopathy    Lung nodules 03/11/2013   Weight loss, non-intentional 01/28/2013   CAD (coronary artery disease) 08/03/2011   HTN (hypertension) 08/03/2011   Hyperlipidemia 08/03/2011   Valvular heart disease 08/03/2011   Past Medical History:  Diagnosis Date   Anemia    Arthritis    Back pain    Bursitis of left hip    Coronary artery disease    TWO VESSEL BYPASS SURGERY in January 1995   Diverticulosis     Hyperlipidemia    Hypertension    Monoclonal gammopathy    Neuropathy     Family History  Problem Relation Age of Onset   Lung cancer Father 55       smoker   Diabetes Father    Cancer Sister        breast in one; "bone" cancer in other    Past Surgical History:  Procedure Laterality Date   [place], 2002   X3   COLONOSCOPY     CORONARY ARTERY BYPASS GRAFT     X2. LIMA GRAFT AND A RIMA GRAFT.   INGUINAL HERNIA REPAIR Left    Negative stress echo  08/11/2009   US ECHOCARDIOGRAPHY  June 2012   Normal EF, mild aortic regurgitation, mild MR, LAE, mildly dilated RV, RAE, increased left atrial pressure, moderate to severe TR and mildly increased  PA systolic pressure.    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  [employer]  Tobacco Use   Smoking status: Former    Packs/day: 2.00    Years: 5.00    Pack years: 10.00    Types: Cigarettes    Quit date: 10/04/1967    Years since quitting: 53.5   Smokeless tobacco: Never  Vaping Use   Vaping Use: Never used  Substance and Sexual Activity   Alcohol use: Yes    Alcohol/week: 7.0 standard drinks    Types: 7 Shots of liquor per week   Drug use: No   Sexual activity: Not Currently

## 2021-03-25 ENCOUNTER — Inpatient Hospital Stay: Payer: Medicare Other | Attending: Hematology and Oncology

## 2021-03-25 ENCOUNTER — Other Ambulatory Visit: Payer: Self-pay

## 2021-03-25 DIAGNOSIS — J8410 Pulmonary fibrosis, unspecified: Secondary | ICD-10-CM | POA: Diagnosis present

## 2021-03-25 DIAGNOSIS — D472 Monoclonal gammopathy: Secondary | ICD-10-CM

## 2021-03-25 DIAGNOSIS — R634 Abnormal weight loss: Secondary | ICD-10-CM | POA: Diagnosis present

## 2021-03-25 LAB — CBC WITH DIFFERENTIAL/PLATELET
Abs Immature Granulocytes: 0.03 10*3/uL (ref 0.00–0.07)
Basophils Absolute: 0 10*3/uL (ref 0.0–0.1)
Basophils Relative: 0 %
Eosinophils Absolute: 0 10*3/uL (ref 0.0–0.5)
Eosinophils Relative: 0 %
HCT: 36.6 % — ABNORMAL LOW (ref 39.0–52.0)
Hemoglobin: 12.6 g/dL — ABNORMAL LOW (ref 13.0–17.0)
Immature Granulocytes: 0 %
Lymphocytes Relative: 11 %
Lymphs Abs: 1.3 10*3/uL (ref 0.7–4.0)
MCH: 31.6 pg (ref 26.0–34.0)
MCHC: 34.4 g/dL (ref 30.0–36.0)
MCV: 91.7 fL (ref 80.0–100.0)
Monocytes Absolute: 0.8 10*3/uL (ref 0.1–1.0)
Monocytes Relative: 6 %
Neutro Abs: 10.3 10*3/uL — ABNORMAL HIGH (ref 1.7–7.7)
Neutrophils Relative %: 83 %
Platelets: 293 10*3/uL (ref 150–400)
RBC: 3.99 MIL/uL — ABNORMAL LOW (ref 4.22–5.81)
RDW: 12.9 % (ref 11.5–15.5)
WBC: 12.4 10*3/uL — ABNORMAL HIGH (ref 4.0–10.5)
nRBC: 0 % (ref 0.0–0.2)

## 2021-03-25 LAB — COMPREHENSIVE METABOLIC PANEL
ALT: 22 U/L (ref 0–44)
AST: 19 U/L (ref 15–41)
Albumin: 4.2 g/dL (ref 3.5–5.0)
Alkaline Phosphatase: 53 U/L (ref 38–126)
Anion gap: 11 (ref 5–15)
BUN: 25 mg/dL — ABNORMAL HIGH (ref 8–23)
CO2: 25 mmol/L (ref 22–32)
Calcium: 9.5 mg/dL (ref 8.9–10.3)
Chloride: 104 mmol/L (ref 98–111)
Creatinine, Ser: 1.4 mg/dL — ABNORMAL HIGH (ref 0.61–1.24)
GFR, Estimated: 50 mL/min — ABNORMAL LOW (ref >60)
Glucose, Bld: 117 mg/dL — ABNORMAL HIGH (ref 70–99)
Potassium: 4.6 mmol/L (ref 3.5–5.1)
Sodium: 140 mmol/L (ref 135–145)
Total Bilirubin: 0.6 mg/dL (ref 0.3–1.2)
Total Protein: 7.8 g/dL (ref 6.5–8.1)

## 2021-03-26 LAB — KAPPA/LAMBDA LIGHT CHAINS
Kappa free light chain: 36.6 mg/L — ABNORMAL HIGH (ref 3.3–19.4)
Kappa, lambda light chain ratio: 2.54 — ABNORMAL HIGH (ref 0.26–1.65)
Lambda free light chains: 14.4 mg/L (ref 5.7–26.3)

## 2021-03-29 LAB — MULTIPLE MYELOMA PANEL, SERUM
Albumin SerPl Elph-Mcnc: 4 g/dL (ref 2.9–4.4)
Albumin/Glob SerPl: 1.3 (ref 0.7–1.7)
Alpha 1: 0.2 g/dL (ref 0.0–0.4)
Alpha2 Glob SerPl Elph-Mcnc: 0.8 g/dL (ref 0.4–1.0)
B-Globulin SerPl Elph-Mcnc: 1 g/dL (ref 0.7–1.3)
Gamma Glob SerPl Elph-Mcnc: 1.2 g/dL (ref 0.4–1.8)
Globulin, Total: 3.2 g/dL (ref 2.2–3.9)
IgA: 232 mg/dL (ref 61–437)
IgG (Immunoglobin G), Serum: 1186 mg/dL (ref 603–1613)
IgM (Immunoglobulin M), Srm: 14 mg/dL — ABNORMAL LOW (ref 15–143)
M Protein SerPl Elph-Mcnc: 0.7 g/dL — ABNORMAL HIGH
Total Protein ELP: 7.2 g/dL (ref 6.0–8.5)

## 2021-04-01 ENCOUNTER — Other Ambulatory Visit: Payer: Self-pay

## 2021-04-01 ENCOUNTER — Encounter: Payer: Self-pay | Admitting: Hematology and Oncology

## 2021-04-01 ENCOUNTER — Inpatient Hospital Stay: Payer: Medicare Other | Admitting: Hematology and Oncology

## 2021-04-01 VITALS — BP 131/79 | HR 79 | Temp 98.2°F | Resp 18 | Wt 157.0 lb

## 2021-04-01 DIAGNOSIS — D638 Anemia in other chronic diseases classified elsewhere: Secondary | ICD-10-CM

## 2021-04-01 DIAGNOSIS — N183 Chronic kidney disease, stage 3 unspecified: Secondary | ICD-10-CM | POA: Diagnosis not present

## 2021-04-01 DIAGNOSIS — D472 Monoclonal gammopathy: Secondary | ICD-10-CM

## 2021-04-01 DIAGNOSIS — R634 Abnormal weight loss: Secondary | ICD-10-CM | POA: Diagnosis not present

## 2021-04-01 NOTE — Assessment & Plan Note (Signed)
His recent myeloma panel was stable He is reassured The elevated serum creatinine is not due to multiple myeloma

## 2021-04-01 NOTE — Progress Notes (Signed)
[facility] OFFICE PROGRESS NOTE  Patient Care Team: Ginger Organ., MD as PCP - General (Internal Medicine) Inda Castle, MD (Inactive) as Attending Physician (Gastroenterology) Martinique, Peter M, MD as Attending Physician (Cardiology) Jarome Matin, MD as Consulting Physician (Dermatology) System, Provider Not In Heath Lark, MD as Consulting Physician (Hematology and Oncology)  ASSESSMENT & PLAN:  Monoclonal gammopathy His recent myeloma panel was stable He is reassured The elevated serum creatinine is not due to multiple myeloma  CKD (chronic kidney disease), stage III ([place]) He has elevated serum creatinine, consistent with chronic kidney disease stage III I recommend the patient to increase oral fluid intake and to aim approximately 80 ounces of water per day His elevated creatinine is not due to his MGUS  Anemia due to chronic illness This is likely related to chronic kidney disease stage III Observe closely for now  Orders Placed This Encounter  Procedures   CBC with Differential/Platelet    Standing Status:   Standing    Number of Occurrences:   22    Standing Expiration Date:   04/01/2022   Comprehensive metabolic panel    Standing Status:   Standing    Number of Occurrences:   33    Standing Expiration Date:   04/01/2022   Kappa/lambda light chains    Standing Status:   Standing    Number of Occurrences:   22    Standing Expiration Date:   04/01/2022   Multiple Myeloma Panel (SPEP&IFE w/QIG)    Standing Status:   Standing    Number of Occurrences:   22    Standing Expiration Date:   04/01/2022    All questions were answered. The patient knows to call the clinic with any problems, questions or concerns. The total time spent in the appointment was 20 minutes encounter with patients including review of chart and various tests results, discussions about plan of care and coordination of care plan   Heath Lark, MD 04/01/2021 1:53 PM  INTERVAL  HISTORY: Please see below for problem oriented charting. He returns for further follow-up Is feeling fine Denies recent infection No recent cough, chest pain or shortness of breath No new bone pain  SUMMARY OF ONCOLOGIC HISTORY:  This is a pleasant gentleman who was found to have anemia and abnormal M spike. He had further workup including blood work, urine test, skeletal survey as well as bone marrow aspirate and biopsy. 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 He also has abnormal lung nodule, observed with serial imaging CT scan. Last CT scan of the chest dated June 2016, confirmed benign lung nodules with no further follow-up recommended Due to abnormal weight loss, he underwent CT imaging of the chest, abdomen and pelvis on August 21, 2020 which showed progression of pulmonary fibrosis and diverticulosis of the colon REVIEW OF SYSTEMS:   Constitutional: Denies fevers, chills or abnormal weight loss Eyes: Denies blurriness of vision Ears, nose, mouth, throat, and face: Denies mucositis or sore throat Respiratory: Denies cough, dyspnea or wheezes Cardiovascular: Denies palpitation, chest discomfort or lower extremity swelling Gastrointestinal:  Denies nausea, heartburn or change in bowel habits Skin: Denies abnormal skin rashes Lymphatics: Denies new lymphadenopathy or easy bruising Neurological:Denies numbness, tingling or new weaknesses Behavioral/Psych: Mood is stable, no new changes  All other systems were reviewed with the patient and are negative.  I have reviewed the past medical history, past surgical history, social history and family history with the patient and  they are unchanged from previous note.  ALLERGIES:  is allergic to cortisone.  MEDICATIONS:  Current Outpatient Medications  Medication Sig Dispense Refill   Ascorbic Acid (VITAMIN C) 100 MG tablet Take 100 mg by mouth 2 (two) times daily.      aspirin 81 MG tablet Take 81 mg by mouth daily.      atorvastatin (LIPITOR) 20 MG tablet Take 20 mg by mouth daily.     B Complex Vitamins (VITAMIN B COMPLEX PO) Take 1 tablet by mouth daily.     Cholecalciferol (VITAMIN D PO) Take 1,000 Units by mouth daily.     Ferrous Sulfate (IRON PO) Take by mouth daily.     gabapentin (NEURONTIN) 300 MG capsule Take 300 mg by mouth daily as needed.     HYDROcodone-acetaminophen (NORCO/VICODIN) 5-325 MG tablet Take 1 tablet by mouth every 6 (six) hours as needed for moderate pain. 30 tablet 0   metoprolol succinate (TOPROL-XL) 25 MG 24 hr tablet Take 12.5 mg by mouth daily.     Misc Natural Products (GLUCOSAMINE CHOND COMPLEX/MSM) TABS take 1 po qd     Multiple Vitamins-Minerals (ZINC PO) Take by mouth daily.     pregabalin (LYRICA) 75 MG capsule as needed.     SYNTHROID 75 MCG tablet Take 75 mcg by mouth daily before breakfast.      No current facility-administered medications for this visit.    PHYSICAL EXAMINATION: ECOG PERFORMANCE STATUS: 1 - Symptomatic but completely ambulatory  Vitals:   04/01/21 1309  BP: 131/79  Pulse: 79  Resp: 18  Temp: 98.2 F (36.8 C)  SpO2: 98%   Filed Weights   04/01/21 1309  Weight: 157 lb (71.2 kg)    GENERAL:alert, no distress and comfortable Musculoskeletal:no cyanosis of digits and no clubbing  NEURO: alert & oriented x 3 with fluent speech, no focal motor/sensory deficits  LABORATORY DATA:  I have reviewed the data as listed    Component Value Date/Time   NA 140 03/25/2021 1329   NA 135 (L) 03/23/2017 1316   K 4.6 03/25/2021 1329   K 4.5 03/23/2017 1316   CL 104 03/25/2021 1329   CL 94 (L) 03/20/2013 1510   CO2 25 03/25/2021 1329   CO2 26 03/23/2017 1316   GLUCOSE 117 (H) 03/25/2021 1329   GLUCOSE 103 03/23/2017 1316   GLUCOSE 122 (H) 03/20/2013 1510   BUN 25 (H) 03/25/2021 1329   BUN 17.8 03/23/2017 1316   CREATININE 1.40 (H) 03/25/2021 1329   CREATININE 1.3 03/23/2017 1316   CALCIUM 9.5 03/25/2021 1329   CALCIUM 9.6 03/23/2017 1316    PROT 7.8 03/25/2021 1329   PROT 6.5 03/23/2017 1317   PROT 6.8 03/23/2017 1316   ALBUMIN 4.2 03/25/2021 1329   ALBUMIN 3.8 03/23/2017 1316   AST 19 03/25/2021 1329   AST 29 03/23/2017 1316   ALT 22 03/25/2021 1329   ALT 28 03/23/2017 1316   ALKPHOS 53 03/25/2021 1329   ALKPHOS 59 03/23/2017 1316   BILITOT 0.6 03/25/2021 1329   BILITOT 1.00 03/23/2017 1316   GFRNONAA 50 (L) 03/25/2021 1329   GFRAA >60 03/23/2020 1333    No results found for: SPEP, UPEP  Lab Results  Component Value Date   WBC 12.4 (H) 03/25/2021   NEUTROABS 10.3 (H) 03/25/2021   HGB 12.6 (L) 03/25/2021   HCT 36.6 (L) 03/25/2021   MCV 91.7 03/25/2021   PLT 293 03/25/2021      Chemistry  Component Value Date/Time   NA 140 03/25/2021 1329   NA 135 (L) 03/23/2017 1316   K 4.6 03/25/2021 1329   K 4.5 03/23/2017 1316   CL 104 03/25/2021 1329   CL 94 (L) 03/20/2013 1510   CO2 25 03/25/2021 1329   CO2 26 03/23/2017 1316   BUN 25 (H) 03/25/2021 1329   BUN 17.8 03/23/2017 1316   CREATININE 1.40 (H) 03/25/2021 1329   CREATININE 1.3 03/23/2017 1316      Component Value Date/Time   CALCIUM 9.5 03/25/2021 1329   CALCIUM 9.6 03/23/2017 1316   ALKPHOS 53 03/25/2021 1329   ALKPHOS 59 03/23/2017 1316   AST 19 03/25/2021 1329   AST 29 03/23/2017 1316   ALT 22 03/25/2021 1329   ALT 28 03/23/2017 1316   BILITOT 0.6 03/25/2021 1329   BILITOT 1.00 03/23/2017 1316

## 2021-04-01 NOTE — Assessment & Plan Note (Signed)
He has elevated serum creatinine, consistent with chronic kidney disease stage III I recommend the patient to increase oral fluid intake and to aim approximately 80 ounces of water per day His elevated creatinine is not due to his MGUS 

## 2021-04-01 NOTE — Assessment & Plan Note (Signed)
This is likely related to chronic kidney disease stage III Observe closely for now 

## 2021-05-04 ENCOUNTER — Telehealth: Payer: Self-pay | Admitting: *Deleted

## 2021-05-04 NOTE — Telephone Encounter (Signed)
   Name: Mitchell Fields  DOB: [birth year]  MRN: [id number]   Primary Cardiologist: None  Chart reviewed as part of pre-operative protocol coverage. Patient was contacted 05/04/2021 in reference to pre-operative risk assessment for pending surgery as outlined below.  Mitchell Fields was last seen on 10/09/2020 by Dr. Martinique.  Since that day, Mitchell Fields has done well without any exertional chest pain or worsening dyspnea.  He is able to accomplish more than 4 METS of activity.  Therefore, based on ACC/AHA guidelines, the patient would be at acceptable risk for the planned procedure without further cardiovascular testing.   The patient was advised that if he develops new symptoms prior to surgery to contact our office to arrange for a follow-up visit, and he verbalized understanding.  I will route this recommendation to the requesting party via Epic fax function and remove from pre-op pool. Please call with questions.  [place], [place] 05/04/2021, 4:47 PM

## 2021-05-04 NOTE — Telephone Encounter (Cosign Needed)
   [facility] Pre-operative Risk Assessment    Patient Name: Mitchell Fields  DOB: 1936/07/23 MRN: [id number]  Request for surgical clearance:  What type of surgery is being performed? ENDOSCOPIC CARPAL TUNNEL RELEASE   When is this surgery scheduled? 05/21/2021  What type of clearance is required (medical clearance vs. Pharmacy clearance to hold med vs. Both)?   Are there any medications that need to be held prior to surgery and how long?   Practice name and name of physician performing surgery? [facility]via Pickles DR [place]   What is the office phone number? [phone]   7.   What is the office fax number? [place]   8.   Anesthesia type (None, local, MAC, general) ? MAC

## 2021-08-23 ENCOUNTER — Ambulatory Visit: Payer: Medicare Other | Admitting: Orthopaedic Surgery

## 2021-08-23 ENCOUNTER — Encounter: Payer: Self-pay | Admitting: Orthopaedic Surgery

## 2021-08-23 ENCOUNTER — Other Ambulatory Visit: Payer: Self-pay

## 2021-08-23 DIAGNOSIS — M7062 Trochanteric bursitis, left hip: Secondary | ICD-10-CM | POA: Diagnosis not present

## 2021-08-23 DIAGNOSIS — M7061 Trochanteric bursitis, right hip: Secondary | ICD-10-CM | POA: Insufficient documentation

## 2021-08-23 MED ORDER — METHYLPREDNISOLONE ACETATE 40 MG/ML IJ SUSP
40.0000 mg | INTRAMUSCULAR | Status: AC | PRN
  Administered 2021-08-23: 40 mg via INTRA_ARTICULAR

## 2021-08-23 MED ORDER — HYDROCODONE-ACETAMINOPHEN 5-325 MG PO TABS: 1.0000 | ORAL_TABLET | Freq: Four times a day (QID) | ORAL | 0 refills | Status: DC | PRN

## 2021-08-23 MED ORDER — LIDOCAINE HCL 1 % IJ SOLN
3.0000 mL | INTRAMUSCULAR | Status: AC | PRN
  Administered 2021-08-23: 3 mL

## 2021-08-23 NOTE — Progress Notes (Signed)
Office Visit Note   Patient: Mitchell Fields           Date of Birth: November 26, 1935           MRN: [id number] Visit Date: 08/23/2021              Requested by: Ginger Organ., MD [address] [place],  [place] [address] PCP: Ginger Organ., MD   Assessment & Plan: Visit Diagnoses:  1. Trochanteric bursitis, left hip     Plan: I did place a steroid injection in the point of maximum tenderness around his left hip trochanteric area.  He tolerated it well.  I will send in some hydrocodone as well.  Follow-up is as needed.  Follow-Up Instructions: Return if symptoms worsen or fail to improve.   Orders:  Orders Placed This Encounter  Procedures   Large Joint Inj   No orders of the defined types were placed in this encounter.     Procedures: Large Joint Inj: L greater trochanter on 08/23/2021 1:28 PM Indications: pain and diagnostic evaluation Details: 22 G 1.5 in needle, lateral approach  Arthrogram: No  Medications: 3 mL lidocaine 1 %; 40 mg methylPREDNISolone acetate 40 MG/ML Outcome: tolerated well, no immediate complications Procedure, treatment alternatives, risks and benefits explained, specific risks discussed. Consent was given by the patient. Immediately prior to procedure a time out was called to verify the correct patient, procedure, equipment, support staff and site/side marked as required. Patient was prepped and draped in the usual sterile fashion.      Clinical Data: No additional findings.   Subjective: No chief complaint on file. The patient comes in today requesting steroid injection in his left hip trochanteric area because of getting her to go on a trip out of the country for about a month.  He also needs a refill of hydrocodone.  He is very active 84 year old gentleman.  I have injected his right hip trochanteric area before.  He has had several back surgeries in the past with the last being at L5-S1 to the left side.  He points to just  posterior to the left trochanteric area as the source of his pain.  He denies any groin pain.  He has had no other acute changes in medical status.  He is not a diabetic.  He does report bilateral shoulder weakness but no pain.  HPI  Review of Systems There is currently listed no headache, chest pain, shortness of breath, fever, chills, nausea, vomiting  Objective: Vital Signs: There were no vitals taken for this visit.  Physical Exam He is alert and orient x3 and in no acute distress Ortho Exam Examination of his left hip shows he has some pain just posterior to the trochanteric area and that is the only deficit in his exam. Specialty Comments:  No specialty comments available.  Imaging: No results found.   PMFS History: Patient Active Problem List   Diagnosis Date Noted   Trochanteric bursitis, right hip 08/23/2021   CKD (chronic kidney disease), stage III ([place]) 04/01/2021   Positive anti-CCP test 11/24/2020   Hand deformity 11/24/2020   Pulmonary fibrosis ([place]) 08/24/2020   Diverticulosis of colon 08/24/2020   Melena 08/14/2020   Cough in adult 08/14/2020   Left rotator cuff tear arthropathy 09/16/2019   Other fatigue 61/60/7371   Alcoholic peripheral neuropathy ([place]) 03/12/2018   Sciatica, right side 10/11/2017   Acute pain of right shoulder 03/02/2017   Impingement syndrome of right shoulder 03/02/2017  Anemia due to chronic illness 03/13/2014   Monoclonal gammopathy    Lung nodules 03/11/2013   Weight loss, non-intentional 01/28/2013   CAD (coronary artery disease) 08/03/2011   HTN (hypertension) 08/03/2011   Hyperlipidemia 08/03/2011   Valvular heart disease 08/03/2011   Past Medical History:  Diagnosis Date   Anemia    Arthritis    Back pain    Bursitis of left hip    Coronary artery disease    TWO VESSEL BYPASS SURGERY in January 1995   Diverticulosis    Hyperlipidemia    Hypertension    Monoclonal gammopathy    Neuropathy     Family History   Problem Relation Age of Onset   Lung cancer Father 79       smoker   Diabetes Father    Cancer Sister        breast in one; "bone" cancer in other    Past Surgical History:  Procedure Laterality Date   [place], 2002   X3   COLONOSCOPY     CORONARY ARTERY BYPASS GRAFT     X2. LIMA GRAFT AND A RIMA GRAFT.   INGUINAL HERNIA REPAIR Left    Negative stress echo  08/11/2009   US ECHOCARDIOGRAPHY  June 2012   Normal EF, mild aortic regurgitation, mild MR, LAE, mildly dilated RV, RAE, increased left atrial pressure, moderate to severe TR and mildly increased  PA systolic pressure.    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  [employer]  Tobacco Use   Smoking status: Former    Packs/day: 2.00    Years: 5.00    Pack years: 10.00    Types: Cigarettes    Quit date: 10/04/1967    Years since quitting: 53.9   Smokeless tobacco: Never  Vaping Use   Vaping Use: Never used  Substance and Sexual Activity   Alcohol use: Yes    Alcohol/week: 7.0 standard drinks    Types: 7 Shots of liquor per week   Drug use: No   Sexual activity: Not Currently

## 2021-10-10 NOTE — Progress Notes (Signed)
Cardiology Office Note    Date:  10/19/2021   ID:  Mitchell Fields, DOB [birth year], MRN [id number]  PCP:  Ginger Organ., MD  Cardiologist:  Eiliyah Reh Martinique, MD    History of Present Illness:  Mitchell Fields is a 86 y.o. male seen for follow up CAD. He has a known history of remote MI, remote CABG 1995 in [place], also has HTN and hyperlipidemia. He had an echo in June 2012 showing normal EF, mild aortic regurgitation, mild MR, mild to moderate LAE, mildly dilated RV, moderate RAE, moderate to severe TR and redundant atrial septum consistent with increased left atrial pressure. Echo in 2014 showed mild LV dysfunction with EF 45-50%. Mild AI and MR. CABG in 1995 included  LIMA and RIMA grafts. He has been diagnosed with monoclonal gammopathy and anemia of chronic disease.   He was seen in November 2021 by Dr Margaretann Loveless for symptoms of fatigue. He had been transitioned from atenolol to Toprol due to decreased EF. Repeat Echo done and was unchanged. He is only taking 12.5 mg daily of metoprolol and this really didn't change his fatigue.  He had a CT showing IPF. Now being seen by Dr Vaughan Browner.   Recently he went on a trip to [place], [place], and [place]. He did well until return when he came down with bronchitis. Was treated with antibiotics. Is better now. CXR today showed NAD. He tries to stay active walking, stretching and traveling. Does yard work. Walks 1-2 miles per day.Denies any chest pain. Has no edema.    Past Medical History:  Diagnosis Date   Anemia    Arthritis    Back pain    Bursitis of left hip    Coronary artery disease    TWO VESSEL BYPASS SURGERY in January 1995   Diverticulosis    Hyperlipidemia    Hypertension    Monoclonal gammopathy    Neuropathy     Past Surgical History:  Procedure Laterality Date   ADENOIDECTOMY     BACK SURGERY  1985, 1995, 2002   X3   COLONOSCOPY     CORONARY ARTERY BYPASS GRAFT     X2. LIMA GRAFT AND A RIMA GRAFT.   INGUINAL HERNIA REPAIR  Left    Negative stress echo  08/11/2009   US ECHOCARDIOGRAPHY  June 2012   Normal EF, mild aortic regurgitation, mild MR, LAE, mildly dilated RV, RAE, increased left atrial pressure, moderate to severe TR and mildly increased  PA systolic pressure.     Current Medications: Outpatient Medications Prior to Visit  Medication Sig Dispense Refill   Ascorbic Acid (VITAMIN C) 100 MG tablet Take 100 mg by mouth 2 (two) times daily.      aspirin 81 MG tablet Take 81 mg by mouth daily.     atorvastatin (LIPITOR) 20 MG tablet Take 20 mg by mouth daily.     B Complex Vitamins (VITAMIN B COMPLEX PO) Take 1 tablet by mouth daily.     Cholecalciferol (VITAMIN D PO) Take 1,000 Units by mouth daily.     Ferrous Sulfate (IRON PO) Take by mouth daily.     gabapentin (NEURONTIN) 300 MG capsule Take 300 mg by mouth daily as needed.     HYDROcodone-acetaminophen (NORCO/VICODIN) 5-325 MG tablet Take 1 tablet by mouth every 6 (six) hours as needed for moderate pain. 30 tablet 0   metoprolol succinate (TOPROL-XL) 25 MG 24 hr tablet Take 12.5 mg by mouth daily.  Misc Natural Products (GLUCOSAMINE CHOND COMPLEX/MSM) TABS take 1 po qd     Multiple Vitamins-Minerals (ZINC PO) Take by mouth daily.     pregabalin (LYRICA) 75 MG capsule as needed.     SYNTHROID 75 MCG tablet Take 75 mcg by mouth daily before breakfast.      No facility-administered medications prior to visit.     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  [employer]  Tobacco Use   Smoking status: Former    Packs/day: 2.00    Years: 5.00    Pack years: 10.00    Types: Cigarettes    Quit date: 10/04/1967    Years since quitting: 54.0   Smokeless tobacco: Never  Vaping Use    Vaping Use: Never used  Substance and Sexual Activity   Alcohol use: Yes    Alcohol/week: 7.0 standard drinks    Types: 7 Shots of liquor per week   Drug use: No   Sexual activity: Not Currently  Other Topics Concern   Not on file  Social History Narrative   Not on file   Social Determinants of Health   Financial Resource Strain: Not on file  Food Insecurity: Not on file  Transportation Needs: Not on file  Physical Activity: Not on file  Stress: Not on file  Social Connections: Not on file     Family History:  The patient's family history includes Cancer in his sister; Diabetes in his father; Lung cancer (age of onset: 66) in his father.   ROS:   Please see the history of present illness.    ROS All other systems reviewed and are negative.   PHYSICAL EXAM:   VS:  BP 118/60    Pulse 73    Ht 5\' 10"  (1.778 m)    Wt 155 lb 9.6 oz (70.6 kg)    SpO2 97%    BMI 22.33 kg/m    GENERAL:  Well appearing WM in NAD HEENT:  PERRL, EOMI, sclera are clear. Oropharynx is clear. NECK:  No jugular venous distention, carotid upstroke brisk and symmetric, no bruits, no thyromegaly or adenopathy LUNGS:  few crackles and squeaks.  CHEST:  Scattered dry crackles.  HEART:  RRR,  PMI not displaced or sustained,S1 and S2 within normal limits, no S3, no S4: no clicks, no rubs, no murmurs ABD:  Soft, nontender. BS +, no masses or bruits. No hepatomegaly, no splenomegaly EXT:  2 + pulses throughout, no edema, no cyanosis no clubbing SKIN:  Warm and dry.  No rashes NEURO:  Alert and oriented x 3. Cranial nerves II through XII intact. PSYCH:  Cognitively intact   Wt Readings from Last 3 Encounters:  10/19/21 155 lb 9.6 oz (70.6 kg)  10/19/21 155 lb 9.6 oz (70.6 kg)  04/01/21 157 lb (71.2 kg)      Studies/Labs Reviewed:   EKG:  EKG is ordered today. NSR with PACs. Rate 73. LAFB, RBBB. I have personally reviewed and interpreted this study.    Labs dated 03/09/16: cholesterol 129,  triglycerides 148, HDL 50, LDL 49.  Dated 10/04/16: creatinine 1.5. Other chemistries normal.  Dated 05/17/17: cholesterol 134, triglycerides 150, HDL  48, LDL 56. CBC, CMET, TSH normal.  Dated 03/01/18: cholesterol 149, triglycerides 82, HDL 56, LDL 77. CBC, CMET and TFTs normal. Dated 04/17/19: cholesterol 135, triglycerides 119, HDL 40, LDL 71.  Dated 05/20/21: cholesterol 159, triglycerides 145, HDL 46, LDL 84. Creatinine 1.4. otherwise CMET and TSH normal  Recent Labs: 10/19/2021: ALT 24; BUN 23; Creatinine, Ser 1.14; Hemoglobin 11.5; Platelets 261; Potassium 4.3; Sodium 138   Lipid Panel    Component Value Date/Time   CHOL 178 03/23/2020 1333   TRIG 142 03/23/2020 1333   HDL 54 03/23/2020 1333   CHOLHDL 3.3 03/23/2020 1333   VLDL 28 03/23/2020 1333   LDLCALC 96 03/23/2020 1333    Additional studies/ records that were reviewed today include:  Echo 03/26/13:- Left ventricle: The cavity size was normal. Wall thickness   was normal. Systolic function was mildly reduced. The   estimated ejection fraction was in the range of 45% to   50%. There is hypokinesis of the basal-mid inferior   posterior myocardium. Doppler parameters are consistent   with abnormal left ventricular relaxation (grade 1   diastolic dysfunction). - Aortic valve: Mild regurgitation. - Mitral valve: Mild regurgitation. - Pulmonary arteries: PA peak pressure: 54mm Hg (S).  Echo 09/01/20: IMPRESSIONS     1. Left ventricular ejection fraction, by estimation, is 45 to 50%. The  left ventricle has mildly decreased function. The left ventricle  demonstrates regional wall motion abnormalities (see scoring  diagram/findings for description). There is mild  concentric left ventricular hypertrophy. Left ventricular diastolic  parameters are consistent with Grade I diastolic dysfunction (impaired  relaxation). Elevated left atrial pressure.   2. Right ventricular systolic function is normal. The right ventricular  size is  mildly enlarged. There is moderately elevated pulmonary artery  systolic pressure. The estimated right ventricular systolic pressure is  52.7 mmHg.   3. Left atrial size was mildly dilated.   4. Right atrial size was moderately dilated.   5. The mitral valve is normal in structure. Mild to moderate mitral valve  regurgitation. No evidence of mitral stenosis.   6. Tricuspid valve regurgitation is moderate.   7. The aortic valve is normal in structure. Aortic valve regurgitation is  mild. Mild to moderate aortic valve sclerosis/calcification is present,  without any evidence of aortic stenosis.   8. The inferior vena cava is normal in size with <50% respiratory  variability, suggesting right atrial pressure of 8 mmHg.   ASSESSMENT:    1. Coronary artery disease involving coronary bypass graft of native heart without angina pectoris   2. Pure hypercholesterolemia   3. Essential hypertension   4. Chronic systolic heart failure (HCC)       PLAN:  In order of problems listed above:  CAD - Patient continues to do very well now 25+ years post CABG with RIMA and LIMA grafts. He is asymptomatic. Recommend continued medical therapy with risk factor modification.  HTN- BP is under excellent control.  On very low dose Toprol. HLD - On statin therapy. Patient wanted to stop his statin but he should continue. Ideally LDL should be < 70.  Continue lipitor 20 mg daily Mild LV dysfunction. EF stable 45-50%. On  low dose metoprolol. Stopped ACEi on his own. No evidence of volume overload. IPF.   I will follow up in one year.    Medication Adjustments/Labs and Tests Ordered: Current medicines are reviewed at length with the patient today.  Concerns regarding medicines are outlined above.  Medication changes,  Labs and Tests ordered today are listed in the Patient Instructions below. There are no Patient Instructions on file for this visit.   Signed, Odena Mcquaid [place], MD  10/19/2021 2:40 PM    [facility] [address] [place], [place], [address] [phone]

## 2021-10-15 ENCOUNTER — Telehealth: Payer: Self-pay

## 2021-10-15 NOTE — Telephone Encounter (Signed)
Called and given appt 1/17 at [place]. He verbalized understanding.

## 2021-10-15 NOTE — Telephone Encounter (Signed)
Returned his call. He is concerned that he has lung cancer and would like to see you with labs.  He came back from a trip to [place] on 12/21. Went to urgent care and had bronchitis, negative for covid. He has taken x 2 rounds of antibiotics. He is having hoarseness, shortness of breath, cough, lost 5 lbs in less than a month and tiredness.

## 2021-10-15 NOTE — Telephone Encounter (Signed)
I can see him Tuesday morning at 840 am, 20 mins, no labs

## 2021-10-19 ENCOUNTER — Inpatient Hospital Stay: Payer: Medicare Other | Attending: Hematology and Oncology | Admitting: Hematology and Oncology

## 2021-10-19 ENCOUNTER — Ambulatory Visit: Payer: Medicare Other | Admitting: Cardiology

## 2021-10-19 ENCOUNTER — Ambulatory Visit (HOSPITAL_COMMUNITY)
Admission: RE | Admit: 2021-10-19 | Discharge: 2021-10-19 | Disposition: A | Discharge status: Home / Self Care | Payer: Medicare Other | Source: Ambulatory Visit | Attending: Hematology and Oncology | Admitting: Hematology and Oncology

## 2021-10-19 ENCOUNTER — Inpatient Hospital Stay: Payer: Medicare Other

## 2021-10-19 ENCOUNTER — Encounter: Payer: Self-pay | Admitting: Cardiology

## 2021-10-19 ENCOUNTER — Other Ambulatory Visit: Payer: Self-pay

## 2021-10-19 VITALS — BP 118/60 | HR 73 | Ht 70.0 in | Wt 155.6 lb

## 2021-10-19 VITALS — BP 127/70 | HR 78 | Temp 97.8°F | Resp 18 | Ht 69.0 in | Wt 155.6 lb

## 2021-10-19 DIAGNOSIS — D631 Anemia in chronic kidney disease: Secondary | ICD-10-CM | POA: Insufficient documentation

## 2021-10-19 DIAGNOSIS — I10 Essential (primary) hypertension: Secondary | ICD-10-CM

## 2021-10-19 DIAGNOSIS — R918 Other nonspecific abnormal finding of lung field: Secondary | ICD-10-CM | POA: Diagnosis not present

## 2021-10-19 DIAGNOSIS — I25810 Atherosclerosis of coronary artery bypass graft(s) without angina pectoris: Secondary | ICD-10-CM

## 2021-10-19 DIAGNOSIS — D472 Monoclonal gammopathy: Secondary | ICD-10-CM

## 2021-10-19 DIAGNOSIS — N183 Chronic kidney disease, stage 3 unspecified: Secondary | ICD-10-CM | POA: Diagnosis not present

## 2021-10-19 DIAGNOSIS — J8410 Pulmonary fibrosis, unspecified: Secondary | ICD-10-CM

## 2021-10-19 DIAGNOSIS — R059 Cough, unspecified: Secondary | ICD-10-CM

## 2021-10-19 DIAGNOSIS — E7800 Pure hypercholesterolemia, unspecified: Secondary | ICD-10-CM | POA: Diagnosis not present

## 2021-10-19 DIAGNOSIS — I5022 Chronic systolic (congestive) heart failure: Secondary | ICD-10-CM

## 2021-10-19 DIAGNOSIS — D638 Anemia in other chronic diseases classified elsewhere: Secondary | ICD-10-CM

## 2021-10-19 DIAGNOSIS — R634 Abnormal weight loss: Secondary | ICD-10-CM | POA: Diagnosis not present

## 2021-10-19 LAB — COMPREHENSIVE METABOLIC PANEL
ALT: 24 U/L (ref 0–44)
AST: 22 U/L (ref 15–41)
Albumin: 3.9 g/dL (ref 3.5–5.0)
Alkaline Phosphatase: 64 U/L (ref 38–126)
Anion gap: 6 (ref 5–15)
BUN: 23 mg/dL (ref 8–23)
CO2: 31 mmol/L (ref 22–32)
Calcium: 9 mg/dL (ref 8.9–10.3)
Chloride: 101 mmol/L (ref 98–111)
Creatinine, Ser: 1.14 mg/dL (ref 0.61–1.24)
GFR, Estimated: >60 mL/min (ref >60)
Glucose, Bld: 88 mg/dL (ref 70–99)
Potassium: 4.3 mmol/L (ref 3.5–5.1)
Sodium: 138 mmol/L (ref 135–145)
Total Bilirubin: 0.4 mg/dL (ref 0.3–1.2)
Total Protein: 6.8 g/dL (ref 6.5–8.1)

## 2021-10-19 LAB — CBC WITH DIFFERENTIAL/PLATELET
Abs Immature Granulocytes: 0.01 10*3/uL (ref 0.00–0.07)
Basophils Absolute: 0 10*3/uL (ref 0.0–0.1)
Basophils Relative: 1 %
Eosinophils Absolute: 0.5 10*3/uL (ref 0.0–0.5)
Eosinophils Relative: 9 %
HCT: 33.2 % — ABNORMAL LOW (ref 39.0–52.0)
Hemoglobin: 11.5 g/dL — ABNORMAL LOW (ref 13.0–17.0)
Immature Granulocytes: 0 %
Lymphocytes Relative: 29 %
Lymphs Abs: 1.5 10*3/uL (ref 0.7–4.0)
MCH: 31.8 pg (ref 26.0–34.0)
MCHC: 34.6 g/dL (ref 30.0–36.0)
MCV: 91.7 fL (ref 80.0–100.0)
Monocytes Absolute: 0.6 10*3/uL (ref 0.1–1.0)
Monocytes Relative: 12 %
Neutro Abs: 2.6 10*3/uL (ref 1.7–7.7)
Neutrophils Relative %: 49 %
Platelets: 261 10*3/uL (ref 150–400)
RBC: 3.62 MIL/uL — ABNORMAL LOW (ref 4.22–5.81)
RDW: 13.4 % (ref 11.5–15.5)
WBC: 5.3 10*3/uL (ref 4.0–10.5)
nRBC: 0 % (ref 0.0–0.2)

## 2021-10-19 MED ORDER — NITROGLYCERIN 0.4 MG SL SUBL: 0.4000 mg | SUBLINGUAL_TABLET | SUBLINGUAL | 3 refills | Status: DC | PRN

## 2021-10-20 ENCOUNTER — Encounter: Payer: Self-pay | Admitting: Hematology and Oncology

## 2021-10-20 LAB — KAPPA/LAMBDA LIGHT CHAINS
Kappa free light chain: 68 mg/L — ABNORMAL HIGH (ref 3.3–19.4)
Kappa, lambda light chain ratio: 3.35 — ABNORMAL HIGH (ref 0.26–1.65)
Lambda free light chains: 20.3 mg/L (ref 5.7–26.3)

## 2021-10-20 NOTE — Assessment & Plan Note (Signed)
This is likely related to chronic kidney disease stage III Observe closely for now 

## 2021-10-20 NOTE — Assessment & Plan Note (Signed)
He has been extremely concerned that his ongoing cough is related to undiagnosed lung cancer I reassured the patient that his symptoms are most consistent with a viral illness and he has been treated adequately with multiple courses of antibiotics and steroids His examination is benign I will order labs and chest x-ray and will review test results with him next week

## 2021-10-20 NOTE — Progress Notes (Signed)
[facility] OFFICE PROGRESS NOTE  Patient Care Team: Ginger Organ., MD as PCP - General (Internal Medicine) Inda Castle, MD (Inactive) as Attending Physician (Gastroenterology) Martinique, Peter M, MD as Attending Physician (Cardiology) Jarome Matin, MD as Consulting Physician (Dermatology) System, Provider Not In Heath Lark, MD as Consulting Physician (Hematology and Oncology)  ASSESSMENT & PLAN:  Cough in adult He has been extremely concerned that his ongoing cough is related to undiagnosed lung cancer I reassured the patient that his symptoms are most consistent with a viral illness and he has been treated adequately with multiple courses of antibiotics and steroids His examination is benign I will order labs and chest x-ray and will review test results with him next week  Monoclonal gammopathy His background history of MGUS will predispose him with slight immunocompromise state I plan to recheck myeloma panel again and will review test results with him next week  Weight loss, non-intentional He has slight weight loss likely due to dietary change and increase physical activity with his travel I reassured the patient right now and we will order some basic lab and x-ray for review   Anemia due to chronic illness This is likely related to chronic kidney disease stage III Observe closely for now  Orders Placed This Encounter  Procedures   DG Chest 2 View    Standing Status:   Future    Number of Occurrences:   1    Standing Expiration Date:   10/19/2022    Order Specific Question:   Reason for exam:    Answer:   COUGH, weight loss    Order Specific Question:   Preferred imaging location?    Answer:   [facility]    All questions were answered. The patient knows to call the clinic with any problems, questions or concerns. The total time spent in the appointment was 30 minutes encounter with patients including review of chart and various tests  results, discussions about plan of care and coordination of care plan   Heath Lark, MD 10/20/2021 10:56 AM  INTERVAL HISTORY: Please see below for problem oriented charting. he returns urgent evaluation due to multiple concerns He is here accompanied by his wife He recently had international travel to [place] and [place]  During his travel, he had significant dietary changes due to limited food choices and increased physical activity with walking He had difficulties getting catching a flight home due to delay in one of his flight He had to drive long distance back home and soon upon arrival around Christmas time, he has not been feeling well He went to urgent care and was evaluated around December 22 He was prescribed a course of azithromycin and prednisone for diffuse wheezing, coughing and feeling weak Overall, the first course of antibiotic was helpful and then he completed another course of antibiotics that was prescribed by his primary care doctor prior to the travel He is very concerned about his ongoing cough, feeling weak and recent weight loss He has no recent changes in bowel habits  REVIEW OF SYSTEMS:   Constitutional: Denies fevers, chills  Eyes: Denies blurriness of vision Ears, nose, mouth, throat, and face: Denies mucositis or sore throat Cardiovascular: Denies palpitation, chest discomfort or lower extremity swelling Gastrointestinal:  Denies nausea, heartburn or change in bowel habits Skin: Denies abnormal skin rashes Lymphatics: Denies new lymphadenopathy or easy bruising Neurological:Denies numbness, tingling or new weaknesses Behavioral/Psych: Mood is stable, no new changes  All other  systems were reviewed with the patient and are negative.  I have reviewed the past medical history, past surgical history, social history and family history with the patient and they are unchanged from previous note.  ALLERGIES:  is allergic to cortisone.  MEDICATIONS:  Current  Outpatient Medications  Medication Sig Dispense Refill   Ascorbic Acid (VITAMIN C) 100 MG tablet Take 100 mg by mouth 2 (two) times daily.      aspirin 81 MG tablet Take 81 mg by mouth daily.     atorvastatin (LIPITOR) 20 MG tablet Take 20 mg by mouth daily.     B Complex Vitamins (VITAMIN B COMPLEX PO) Take 1 tablet by mouth daily.     Cholecalciferol (VITAMIN D PO) Take 1,000 Units by mouth daily.     Ferrous Sulfate (IRON PO) Take by mouth daily.     gabapentin (NEURONTIN) 300 MG capsule Take 300 mg by mouth daily as needed.     HYDROcodone-acetaminophen (NORCO/VICODIN) 5-325 MG tablet Take 1 tablet by mouth every 6 (six) hours as needed for moderate pain. 30 tablet 0   metoprolol succinate (TOPROL-XL) 25 MG 24 hr tablet Take 12.5 mg by mouth daily.     Misc Natural Products (GLUCOSAMINE CHOND COMPLEX/MSM) TABS take 1 po qd     Multiple Vitamins-Minerals (ZINC PO) Take by mouth daily.     nitroGLYCERIN (NITROSTAT) 0.4 MG SL tablet Place 1 tablet (0.4 mg total) under the tongue every 5 (five) minutes as needed for chest pain. 90 tablet 3   pregabalin (LYRICA) 75 MG capsule as needed.     SYNTHROID 75 MCG tablet Take 75 mcg by mouth daily before breakfast.      No current facility-administered medications for this visit.    SUMMARY OF ONCOLOGIC HISTORY: Oncology History   No history exists.    PHYSICAL EXAMINATION: ECOG PERFORMANCE STATUS: 1 - Symptomatic but completely ambulatory  Vitals:   10/19/21 0845  BP: 127/70  Pulse: 78  Resp: 18  Temp: 97.8 F (36.6 C)  SpO2: 99%   Filed Weights   10/19/21 0845  Weight: 155 lb 9.6 oz (70.6 kg)    GENERAL:alert, no distress and comfortable SKIN: skin color, texture, turgor are normal, no rashes or significant lesions EYES: normal, Conjunctiva are pink and non-injected, sclera clear OROPHARYNX:no exudate, no erythema and lips, buccal mucosa, and tongue normal  NECK: supple, thyroid normal size, non-tender, without  nodularity LYMPH:  no palpable lymphadenopathy in the cervical, axillary or inguinal LUNGS: clear to auscultation and percussion with normal breathing effort HEART: regular rate & rhythm and no murmurs and no lower extremity edema ABDOMEN:abdomen soft, non-tender and normal bowel sounds Musculoskeletal:no cyanosis of digits and no clubbing  NEURO: alert & oriented x 3 with fluent speech, no focal motor/sensory deficits  LABORATORY DATA:  I have reviewed the data as listed    Component Value Date/Time   NA 138 10/19/2021 0912   NA 135 (L) 03/23/2017 1316   K 4.3 10/19/2021 0912   K 4.5 03/23/2017 1316   CL 101 10/19/2021 0912   CL 94 (L) 03/20/2013 1510   CO2 31 10/19/2021 0912   CO2 26 03/23/2017 1316   GLUCOSE 88 10/19/2021 0912   GLUCOSE 103 03/23/2017 1316   GLUCOSE 122 (H) 03/20/2013 1510   BUN 23 10/19/2021 0912   BUN 17.8 03/23/2017 1316   CREATININE 1.14 10/19/2021 0912   CREATININE 1.3 03/23/2017 1316   CALCIUM 9.0 10/19/2021 0912   CALCIUM 9.6 03/23/2017  1316   PROT 6.8 10/19/2021 0912   PROT 6.5 03/23/2017 1317   PROT 6.8 03/23/2017 1316   ALBUMIN 3.9 10/19/2021 0912   ALBUMIN 3.8 03/23/2017 1316   AST 22 10/19/2021 0912   AST 29 03/23/2017 1316   ALT 24 10/19/2021 0912   ALT 28 03/23/2017 1316   ALKPHOS 64 10/19/2021 0912   ALKPHOS 59 03/23/2017 1316   BILITOT 0.4 10/19/2021 0912   BILITOT 1.00 03/23/2017 1316   GFRNONAA >60 10/19/2021 0912   GFRAA >60 03/23/2020 1333    No results found for: SPEP, UPEP  Lab Results  Component Value Date   WBC 5.3 10/19/2021   NEUTROABS 2.6 10/19/2021   HGB 11.5 (L) 10/19/2021   HCT 33.2 (L) 10/19/2021   MCV 91.7 10/19/2021   PLT 261 10/19/2021      Chemistry      Component Value Date/Time   NA 138 10/19/2021 0912   NA 135 (L) 03/23/2017 1316   K 4.3 10/19/2021 0912   K 4.5 03/23/2017 1316   CL 101 10/19/2021 0912   CL 94 (L) 03/20/2013 1510   CO2 31 10/19/2021 0912   CO2 26 03/23/2017 1316   BUN 23  10/19/2021 0912   BUN 17.8 03/23/2017 1316   CREATININE 1.14 10/19/2021 0912   CREATININE 1.3 03/23/2017 1316      Component Value Date/Time   CALCIUM 9.0 10/19/2021 0912   CALCIUM 9.6 03/23/2017 1316   ALKPHOS 64 10/19/2021 0912   ALKPHOS 59 03/23/2017 1316   AST 22 10/19/2021 0912   AST 29 03/23/2017 1316   ALT 24 10/19/2021 0912   ALT 28 03/23/2017 1316   BILITOT 0.4 10/19/2021 0912   BILITOT 1.00 03/23/2017 1316       RADIOGRAPHIC STUDIES: I have personally reviewed the radiological images as listed and agreed with the findings in the report. DG Chest 2 View  Result Date: 10/19/2021 CLINICAL DATA:  86 year old male with cough, weight loss, monoclonal gammopathy, fibrotic lung disease. EXAM: CHEST - 2 VIEW COMPARISON:  CT Chest, Abdomen, and Pelvis 08/21/2020 and earlier. FINDINGS: Chronic sternotomy and right lateral rib deformities are stable. Normal cardiac size and mediastinal contours. Visualized tracheal air column is within normal limits. Chronic tenting of the right hemidiaphragm. No pneumothorax, pulmonary edema, pleural effusion or acute pulmonary opacity. No acute osseous abnormality identified. Negative visible bowel gas. IMPRESSION: No acute cardiopulmonary abnormality. Electronically Signed   By: Genevie Ann M.D.   On: 10/19/2021 10:19

## 2021-10-20 NOTE — Assessment & Plan Note (Signed)
His background history of MGUS will predispose him with slight immunocompromise state I plan to recheck myeloma panel again and will review test results with him next week

## 2021-10-20 NOTE — Assessment & Plan Note (Signed)
He has slight weight loss likely due to dietary change and increase physical activity with his travel I reassured the patient right now and we will order some basic lab and x-ray for review

## 2021-10-25 LAB — MULTIPLE MYELOMA PANEL, SERUM
Albumin SerPl Elph-Mcnc: 3.4 g/dL (ref 2.9–4.4)
Albumin/Glob SerPl: 1.1 (ref 0.7–1.7)
Alpha 1: 0.2 g/dL (ref 0.0–0.4)
Alpha2 Glob SerPl Elph-Mcnc: 0.8 g/dL (ref 0.4–1.0)
B-Globulin SerPl Elph-Mcnc: 0.9 g/dL (ref 0.7–1.3)
Gamma Glob SerPl Elph-Mcnc: 1.2 g/dL (ref 0.4–1.8)
Globulin, Total: 3.1 g/dL (ref 2.2–3.9)
IgA: 220 mg/dL (ref 61–437)
IgG (Immunoglobin G), Serum: 1098 mg/dL (ref 603–1613)
IgM (Immunoglobulin M), Srm: 18 mg/dL (ref 15–143)
M Protein SerPl Elph-Mcnc: 0.7 g/dL — ABNORMAL HIGH
Total Protein ELP: 6.5 g/dL (ref 6.0–8.5)

## 2021-10-26 ENCOUNTER — Inpatient Hospital Stay (HOSPITAL_BASED_OUTPATIENT_CLINIC_OR_DEPARTMENT_OTHER): Payer: Medicare Other | Admitting: Hematology and Oncology

## 2021-10-26 ENCOUNTER — Encounter: Payer: Self-pay | Admitting: Hematology and Oncology

## 2021-10-26 DIAGNOSIS — D472 Monoclonal gammopathy: Secondary | ICD-10-CM | POA: Diagnosis not present

## 2021-10-26 DIAGNOSIS — R634 Abnormal weight loss: Secondary | ICD-10-CM | POA: Diagnosis not present

## 2021-10-26 NOTE — Assessment & Plan Note (Signed)
His myeloma panel is stable with no significant changes I will see him again in a year for further follow-up with repeat myeloma panel to be done a week ahead of time 

## 2021-10-26 NOTE — Progress Notes (Signed)
HEMATOLOGY-ONCOLOGY ELECTRONIC VISIT PROGRESS NOTE  Patient Care Team: Ginger Organ., MD as PCP - General (Internal Medicine) Inda Castle, MD (Inactive) as Attending Physician (Gastroenterology) Martinique, Peter M, MD as Attending Physician (Cardiology) Jarome Matin, MD as Consulting Physician (Dermatology) System, Provider Not In Heath Lark, MD as Consulting Physician (Hematology and Oncology)  I connected with the patient via telephone conference and verified that I am speaking with the correct person using two identifiers. The patient's location is at home and I am providing care from the [facility] I discussed the limitations, risks, security and privacy concerns of performing an evaluation and management service by e-visits and the availability of in person appointments.  I also discussed with the patient that there may be a patient responsible charge related to this service. The patient expressed understanding and agreed to proceed.   ASSESSMENT & PLAN:  Monoclonal gammopathy His myeloma panel is stable with no significant changes I will see him again in a year for further follow-up with repeat myeloma panel to be done a week ahead of time  Weight loss, non-intentional He has regained his appetite I reassured the patient that I believe his recent bronchitis is due to unspecified infection His chest x-ray showed no evidence of pneumonia and his CBC showed no evidence of leukocytosis The patient is reassured  No orders of the defined types were placed in this encounter.   INTERVAL HISTORY: Please see below for problem oriented charting. The purpose of today's discussion is to review recent test results He felt better since last time I saw him He is eating better and gaining an appetite He denies significant cough or fevers  SUMMARY OF ONCOLOGIC HISTORY:  This is a pleasant gentleman who was found to have anemia and abnormal M spike. He had further workup  including blood work, urine test, skeletal survey as well as bone marrow aspirate and biopsy. 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 He also has abnormal lung nodule, observed with serial imaging CT scan. Last CT scan of the chest dated June 2016, confirmed benign lung nodules with no further follow-up recommended Due to abnormal weight loss, he underwent CT imaging of the chest, abdomen and pelvis on August 21, 2020 which showed progression of pulmonary fibrosis and diverticulosis of the colon  REVIEW OF SYSTEMS:   Constitutional: Denies fevers, chills or abnormal weight loss Eyes: Denies blurriness of vision Ears, nose, mouth, throat, and face: Denies mucositis or sore throat Respiratory: Denies cough, dyspnea or wheezes Cardiovascular: Denies palpitation, chest discomfort Gastrointestinal:  Denies nausea, heartburn or change in bowel habits Skin: Denies abnormal skin rashes Lymphatics: Denies new lymphadenopathy or easy bruising Neurological:Denies numbness, tingling or new weaknesses Behavioral/Psych: Mood is stable, no new changes  Extremities: No lower extremity edema All other systems were reviewed with the patient and are negative.  I have reviewed the past medical history, past surgical history, social history and family history with the patient and they are unchanged from previous note.  ALLERGIES:  is allergic to cortisone.  MEDICATIONS:  Current Outpatient Medications  Medication Sig Dispense Refill   Ascorbic Acid (VITAMIN C) 100 MG tablet Take 100 mg by mouth 2 (two) times daily.      aspirin 81 MG tablet Take 81 mg by mouth daily.     atorvastatin (LIPITOR) 20 MG tablet Take 20 mg by mouth daily.     B Complex Vitamins (VITAMIN B COMPLEX PO) Take 1 tablet by mouth daily.  Cholecalciferol (VITAMIN D PO) Take 1,000 Units by mouth daily.     Ferrous Sulfate (IRON PO) Take by mouth daily.     gabapentin (NEURONTIN) 300 MG capsule Take 300 mg by mouth daily as  needed.     HYDROcodone-acetaminophen (NORCO/VICODIN) 5-325 MG tablet Take 1 tablet by mouth every 6 (six) hours as needed for moderate pain. 30 tablet 0   metoprolol succinate (TOPROL-XL) 25 MG 24 hr tablet Take 12.5 mg by mouth daily.     Misc Natural Products (GLUCOSAMINE CHOND COMPLEX/MSM) TABS take 1 po qd     Multiple Vitamins-Minerals (ZINC PO) Take by mouth daily.     nitroGLYCERIN (NITROSTAT) 0.4 MG SL tablet Place 1 tablet (0.4 mg total) under the tongue every 5 (five) minutes as needed for chest pain. 90 tablet 3   pregabalin (LYRICA) 75 MG capsule as needed.     SYNTHROID 75 MCG tablet Take 75 mcg by mouth daily before breakfast.      No current facility-administered medications for this visit.    PHYSICAL EXAMINATION: ECOG PERFORMANCE STATUS: 0 - Asymptomatic  LABORATORY DATA:  I have reviewed the data as listed CMP Latest Ref Rng & Units 10/19/2021 03/25/2021 01/01/2021  Glucose 70 - 99 mg/dL 88 117(H) -  BUN 8 - 23 mg/dL 23 25(H) -  Creatinine 0.61 - 1.24 mg/dL 1.14 1.40(H) -  Sodium 135 - 145 mmol/L 138 140 -  Potassium 3.5 - 5.1 mmol/L 4.3 4.6 -  Chloride 98 - 111 mmol/L 101 104 -  CO2 22 - 32 mmol/L 31 25 -  Calcium 8.9 - 10.3 mg/dL 9.0 9.5 -  Total Protein 6.5 - 8.1 g/dL 6.8 7.8 7.0  Total Bilirubin 0.3 - 1.2 mg/dL 0.4 0.6 0.5  Alkaline Phos 38 - 126 U/L 64 53 49  AST 15 - 41 U/L 22 19 20   ALT 0 - 44 U/L 24 22 21     Lab Results  Component Value Date   WBC 5.3 10/19/2021   HGB 11.5 (L) 10/19/2021   HCT 33.2 (L) 10/19/2021   MCV 91.7 10/19/2021   PLT 261 10/19/2021   NEUTROABS 2.6 10/19/2021     RADIOGRAPHIC STUDIES: I have personally reviewed the radiological images as listed and agreed with the findings in the report. DG Chest 2 View  Result Date: 10/19/2021 CLINICAL DATA:  86 year old male with cough, weight loss, monoclonal gammopathy, fibrotic lung disease. EXAM: CHEST - 2 VIEW COMPARISON:  CT Chest, Abdomen, and Pelvis 08/21/2020 and earlier.  FINDINGS: Chronic sternotomy and right lateral rib deformities are stable. Normal cardiac size and mediastinal contours. Visualized tracheal air column is within normal limits. Chronic tenting of the right hemidiaphragm. No pneumothorax, pulmonary edema, pleural effusion or acute pulmonary opacity. No acute osseous abnormality identified. Negative visible bowel gas. IMPRESSION: No acute cardiopulmonary abnormality. Electronically Signed   By: Genevie Ann M.D.   On: 10/19/2021 10:19    I discussed the assessment and treatment plan with the patient. The patient was provided an opportunity to ask questions and all were answered. The patient agreed with the plan and demonstrated an understanding of the instructions. The patient was advised to call back or seek an in-person evaluation if the symptoms worsen or if the condition fails to improve as anticipated.    I spent 20 minutes for the appointment reviewing test results, discuss management and coordination of care.  Heath Lark, MD 10/26/2021 1:59 PM

## 2021-10-26 NOTE — Assessment & Plan Note (Signed)
He has regained his appetite I reassured the patient that I believe his recent bronchitis is due to unspecified infection His chest x-ray showed no evidence of pneumonia and his CBC showed no evidence of leukocytosis The patient is reassured

## 2021-11-18 ENCOUNTER — Other Ambulatory Visit: Payer: Self-pay | Admitting: *Deleted

## 2021-11-18 DIAGNOSIS — J84112 Idiopathic pulmonary fibrosis: Secondary | ICD-10-CM

## 2021-11-26 ENCOUNTER — Ambulatory Visit (HOSPITAL_COMMUNITY)
Admission: RE | Admit: 2021-11-26 | Discharge: 2021-11-26 | Disposition: A | Discharge status: Home / Self Care | Payer: Medicare Other | Source: Ambulatory Visit | Attending: Pulmonary Disease | Admitting: Pulmonary Disease

## 2021-11-26 ENCOUNTER — Other Ambulatory Visit: Payer: Self-pay

## 2021-11-26 DIAGNOSIS — J84112 Idiopathic pulmonary fibrosis: Secondary | ICD-10-CM | POA: Insufficient documentation

## 2021-12-10 ENCOUNTER — Other Ambulatory Visit: Payer: Self-pay | Admitting: Pulmonary Disease

## 2021-12-10 DIAGNOSIS — J84112 Idiopathic pulmonary fibrosis: Secondary | ICD-10-CM

## 2021-12-13 ENCOUNTER — Other Ambulatory Visit: Payer: Self-pay

## 2021-12-13 ENCOUNTER — Ambulatory Visit (INDEPENDENT_AMBULATORY_CARE_PROVIDER_SITE_OTHER): Payer: Medicare Other | Admitting: Pulmonary Disease

## 2021-12-13 DIAGNOSIS — J84112 Idiopathic pulmonary fibrosis: Secondary | ICD-10-CM | POA: Diagnosis not present

## 2021-12-13 LAB — PULMONARY FUNCTION TEST
DL/VA % pred: 61 %
DL/VA: 2.38 ml/min/mmHg/L
DLCO cor % pred: 51 %
DLCO cor: 12.07 ml/min/mmHg
DLCO unc % pred: 51 %
DLCO unc: 12.07 ml/min/mmHg
FEF 25-75 Post: 1.61 L/sec
FEF 25-75 Pre: 1.26 L/sec
FEF2575-%Change-Post: 27 %
FEF2575-%Pred-Post: 92 %
FEF2575-%Pred-Pre: 72 %
FEV1-%Change-Post: 6 %
FEV1-%Pred-Post: 88 %
FEV1-%Pred-Pre: 83 %
FEV1-Post: 2.31 L
FEV1-Pre: 2.18 L
FEV1FVC-%Change-Post: 3 %
FEV1FVC-%Pred-Pre: 97 %
FEV6-%Change-Post: 1 %
FEV6-%Pred-Post: 90 %
FEV6-%Pred-Pre: 89 %
FEV6-Post: 3.14 L
FEV6-Pre: 3.09 L
FEV6FVC-%Change-Post: 0 %
FEV6FVC-%Pred-Post: 107 %
FEV6FVC-%Pred-Pre: 106 %
FVC-%Change-Post: 2 %
FVC-%Pred-Post: 86 %
FVC-%Pred-Pre: 83 %
FVC-Post: 3.23 L
FVC-Pre: 3.13 L
Post FEV1/FVC ratio: 72 %
Post FEV6/FVC ratio: 99 %
Pre FEV1/FVC ratio: 69 %
Pre FEV6/FVC Ratio: 99 %
RV % pred: 109 %
RV: 2.92 L
TLC % pred: 89 %
TLC: 6.12 L

## 2021-12-13 NOTE — Progress Notes (Signed)
Full PFT completed today ? ?

## 2021-12-20 ENCOUNTER — Ambulatory Visit: Payer: Medicare Other | Admitting: Pulmonary Disease

## 2021-12-20 ENCOUNTER — Other Ambulatory Visit: Payer: Self-pay

## 2021-12-20 ENCOUNTER — Encounter: Payer: Self-pay | Admitting: Pulmonary Disease

## 2021-12-20 VITALS — BP 126/62 | HR 87 | Temp 98.3°F | Ht 69.0 in | Wt 159.0 lb

## 2021-12-20 DIAGNOSIS — J84112 Idiopathic pulmonary fibrosis: Secondary | ICD-10-CM

## 2021-12-20 NOTE — Progress Notes (Addendum)
? ?      Mitchell Fields    [id number]    1936-05-16 ? ?Primary Care Physician:Shaw, Emily Filbert., MD ? ?Referring Physician: Ginger Organ., MD ?[address] ?[place],  [place] [address] ? ?Chief complaint: Follow-up for interstitial lung disease, pulmonary fibrosis ? ?HPI: ?86 year old with history of monoclonal gammopathy, hypertension, hyperlipidemia, coronary artery disease ?Referred for evaluation of pulmonary fibrosis noted on recent CT chest ? ?He follows with Dr. Alvy Bimler for monoclonal gammopathy and had a CT chest abdomen pelvis for evaluation of unexplained weight loss.  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  He is scheduled for a colonoscopy later this month for work-up of weight loss.  ? ?Chief complaint is fatigue, chronic dyspnea on exertion which is worsening gradually over the past few years. ?He had a consultation with Dr. Benjamine Mola, rheumatology for elevated CCP.  No evidence of rheumatoid arthritis noted ? 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  Later worked in [employer] ?Exposures: Exposure as noted above.  No mold, hot tub, Jacuzzi.  No feather pillows or comforters ?ILD questionnaire 01/01/2021-negative except as noted above ?Smoking history: 10-pack-year smoker.  Quit in 1969 ?Travel history: Previously lived in [place] and [place].  No significant recent travel ?Relevant family history: Dad had lung cancer.  He was a smoker. ? ?Interim history:  ?He is here for review of CT and PFTs.  States that breathing is doing well with no issues ? ?Outpatient Encounter Medications as of 12/20/2021  ?Medication Sig  ? aspirin 81 MG tablet Take 81 mg by mouth daily.  ? atorvastatin (LIPITOR) 20 MG tablet Take 20 mg by mouth daily.  ? B Complex Vitamins (VITAMIN B COMPLEX PO) Take 1 tablet by mouth daily.  ? Cholecalciferol (VITAMIN D PO) Take 1,000 Units by mouth daily.  ? Ferrous Sulfate (IRON PO) Take by  mouth daily.  ? gabapentin (NEURONTIN) 300 MG capsule Take 300 mg by mouth daily as needed.  ? HYDROcodone-acetaminophen (NORCO/VICODIN) 5-325 MG tablet Take 1 tablet by mouth every 6 (six) hours as needed for moderate pain.  ? metoprolol succinate (TOPROL-XL) 25 MG 24 hr tablet Take 12.5 mg by mouth daily.  ? Misc Natural Products (GLUCOSAMINE CHOND COMPLEX/MSM) TABS take 1 po qd  ? Multiple Vitamins-Minerals (ZINC PO) Take by mouth daily.  ? pregabalin (LYRICA) 75 MG capsule as needed.  ? SYNTHROID 75 MCG tablet Take 75 mcg by mouth daily before breakfast.   ? nitroGLYCERIN (NITROSTAT) 0.4 MG SL tablet Place 1 tablet (0.4 mg total) under the tongue every 5 (five) minutes as needed for chest pain. (Patient not taking: Reported on 12/20/2021)  ? [DISCONTINUED] Ascorbic Acid (VITAMIN C) 100 MG tablet Take 100 mg by mouth 2 (two) times daily.   ? ?No facility-administered encounter medications on file as of 12/20/2021.  ? ? ?Physical Exam: ?Blood pressure 126/62, pulse 87, temperature 98.3 ?F (36.8 ?C), temperature source Oral, height '5\' 9"'$  (1.753 m), weight 159 lb (72.1 kg), SpO2 98 %. ?Gen:      No acute distress ?HEENT:  EOMI, sclera anicteric ?Neck:     No masses; no thyromegaly ?Lungs:    Bibasal crackles ?CV:         Regular rate and rhythm; no murmurs ?Abd:      + bowel sounds; soft, non-tender; no palpable masses, no distension ?Ext:    No edema; adequate peripheral perfusion ?Skin:  Warm and dry; no rash ?Neuro: alert and oriented x 3 ?Psych: normal mood and affect  ? ?Data Reviewed: ?Imaging: ?CT chest 03/20/2015-emphysema, subpleural reticulation ?CT chest 08/21/2020-basilar predominant fibrotic ILD, probable UIP pattern.  Progressive since 2016.  Diverticulosis in the abdomen, atherosclerosis. ?CT high-resolution 11/28/2021-ILD and probable UIP pattern.  Stable compared to 2020 ?I have reviewed the images personally. ? ?PFTs: ?01/01/2021 ?FVC 3.38 [89%], FEV1 2.43 [91%], F/F 72, TLC 6.07 [88%], DLCO 12.61  [53%] ?Moderate-severe diffusion defect ? ?12/13/2021 ?FVC 3.23 (86%), FEV1 2.31 (88%), F/F 72, TLC 6.12 (89%), DLCO 12.07 (51%) ?Moderately severe Diffusion Defect ?Lung volumes are stable but diffusion capacity is slightly down compared to April 2022 ? ?Labs: ?CTD serologies 10/08/2020-significant for CCP 120. Rest of the labs are negative ? ?Cardiac: ?Echocardiogram 09/01/2020 ?LVEF 45-50%, mild concentric LVH, grade 1 diastolic dysfunction, moderately elevated PA systolic pressure, RVSP 29.5 ? ?Assessment:  ?Pulmonary fibrosis ?Progressive in nature with probable UIP pattern.  This is likely IPF ?There is no evidence of connective tissue disease though CCP is markedly elevated ?Reviewed rheumatology appointment report ? ?Reviewed treatment options in detail with patient and we have decided to start Esbriet ? ?Plan/Recommendations: ?Check comprehensive metabolic panel, start paperwork for Esbriet ? ?[name] MD ? Pulmonary and Critical Care ?12/20/2021, 2:51 PM ? ?CC: Ginger Organ., MD ? ? ?

## 2021-12-20 NOTE — Patient Instructions (Signed)
I have reviewed his CT and PFTs.  We will get his started on a medication called Esbriet for scarring of the lung ?Return to clinic in 3 months ?

## 2021-12-23 ENCOUNTER — Telehealth: Payer: Self-pay

## 2021-12-23 NOTE — Telephone Encounter (Signed)
Received New start paperwork for ESBRIET. Will update as we work through the benefits process. ? ? ?Submitted a Prior Authorization request to [facility] for PIRFENIDONE via CoverMyMeds. Will update once we receive a response. ? ? ?Key: [id number] ?

## 2021-12-24 ENCOUNTER — Other Ambulatory Visit (HOSPITAL_COMMUNITY): Payer: Self-pay

## 2021-12-24 NOTE — Telephone Encounter (Signed)
Per automated response, the patient currently has access to the requested medication and a prior Authorization is not needed for the patient/medication. ? ?Test claim shows that copay for 30 day supply of pirfenidone is $125 ? ?Patient assistance application can be submitted to [facility] ? ?Knox Saliva, PharmD, MPH, BCPS ?Clinical Pharmacist (Rheumatology and Pulmonology) ?

## 2021-12-24 NOTE — Telephone Encounter (Signed)
Received a fax regarding Prior Authorization from [facility] for PIRFENIDONE. Authorization has been DENIED because [employer] plan only covers medications through medical benefit. ? ?Case ID: [id number] ? ?Patient has [employer] plan under eligibility check. ? ?Submitted a Prior Authorization request to [facility] for PIRFENIDONE via CoverMyMeds. Will update once we receive a response. ? ?Key: [id number] ? ?Knox Saliva, PharmD, MPH, BCPS ?Clinical Pharmacist (Rheumatology and Pulmonology) ? ?  ?

## 2021-12-27 ENCOUNTER — Encounter: Payer: Self-pay | Admitting: Pulmonary Disease

## 2021-12-29 ENCOUNTER — Other Ambulatory Visit (HOSPITAL_COMMUNITY): Payer: Self-pay

## 2021-12-29 NOTE — Telephone Encounter (Signed)
Submitted Patient Assistance Application to [place] for [place] along with provider portion, patient portion, med list, and insurance card copy. Will update patient when we receive a response. ? ?Fax# [phone] ?Phone# [phone] ? ?Knox Saliva, PharmD, MPH, BCPS ?Clinical Pharmacist (Rheumatology and Pulmonology) ? ?

## 2022-01-04 ENCOUNTER — Telehealth (INDEPENDENT_AMBULATORY_CARE_PROVIDER_SITE_OTHER): Payer: Medicare Other | Admitting: Pharmacist

## 2022-01-04 DIAGNOSIS — J84112 Idiopathic pulmonary fibrosis: Secondary | ICD-10-CM

## 2022-01-04 DIAGNOSIS — Z7189 Other specified counseling: Secondary | ICD-10-CM

## 2022-01-04 NOTE — Telephone Encounter (Signed)
ATC patient to provide Esbriet counseling and update Esbriet patient assistance approval. Unable to reach. Left VM requesting return call to review ? ?Knox Saliva, PharmD, MPH, BCPS ?Clinical Pharmacist (Rheumatology and Pulmonology) ?

## 2022-01-04 NOTE — Telephone Encounter (Signed)
Received a fax from  [place] regarding an approval for [place] patient assistance from 01/04/22 until patient no longer meet program eligiblity or discontinues therapy. ? ?[employer] phone: [phone] ?[facility] phone: [phone] ? ?Knox Saliva, PharmD, MPH, BCPS ?Clinical Pharmacist (Rheumatology and Pulmonology) ? ?

## 2022-01-12 MED ORDER — PIRFENIDONE 267 MG PO TABS: ORAL_TABLET | ORAL | 0 refills | Status: DC

## 2022-01-12 NOTE — Telephone Encounter (Signed)
? ?Subjective:  ?Patient called today by [facility] pharmacy team for Esbriet new start counseling - for IPF diagnosis.   Patient was last seen by Dr. Vaughan Browner on 12/20/21.  Pertinent past medical history includes CAD, HTN, valvular heart disease, diverticulosis. He is naive to antifibrotics. ? ?Patient does report hesitation and reluctance with starting Esbriet given side effect potential. He has reviewed medication extensively prior to starting - including cost, side effects, goals (slow down fibrosis and not reverse) ? ?He also is curious if Dr. Vaughan Browner has reviewed previous CT scans and if there is definite progression of scarring. He asks how scarring is measured. ? ?History of elevated LFTs: No ?History of diarrhea, nausea, vomiting: No ? ?Objective: ?Allergies  ?Allergen Reactions  ? Cortisone Other (See Comments)  ?  REACTION: shut adrenal glands down if given too much  ? ? ?Outpatient Encounter Medications as of 01/04/2022  ?Medication Sig  ? aspirin 81 MG tablet Take 81 mg by mouth daily.  ? atorvastatin (LIPITOR) 20 MG tablet Take 20 mg by mouth daily.  ? B Complex Vitamins (VITAMIN B COMPLEX PO) Take 1 tablet by mouth daily.  ? Cholecalciferol (VITAMIN D PO) Take 1,000 Units by mouth daily.  ? Ferrous Sulfate (IRON PO) Take by mouth daily.  ? gabapentin (NEURONTIN) 300 MG capsule Take 300 mg by mouth daily as needed.  ? HYDROcodone-acetaminophen (NORCO/VICODIN) 5-325 MG tablet Take 1 tablet by mouth every 6 (six) hours as needed for moderate pain.  ? metoprolol succinate (TOPROL-XL) 25 MG 24 hr tablet Take 12.5 mg by mouth daily.  ? Misc Natural Products (GLUCOSAMINE CHOND COMPLEX/MSM) TABS take 1 po qd  ? Multiple Vitamins-Minerals (ZINC PO) Take by mouth daily.  ? nitroGLYCERIN (NITROSTAT) 0.4 MG SL tablet Place 1 tablet (0.4 mg total) under the tongue every 5 (five) minutes as needed for chest pain. (Patient not taking: Reported on 12/20/2021)  ? pregabalin (LYRICA) 75 MG capsule as needed.  ?  SYNTHROID 75 MCG tablet Take 75 mcg by mouth daily before breakfast.   ? ?No facility-administered encounter medications on file as of 01/04/2022.  ?  ? ?Immunization History  ?Administered Date(s) Administered  ? Fluad Quad(high Dose 65+) 06/15/2019  ? Influenza Split 11/13/2009, 02/18/2010, 07/07/2010, 05/30/2011, 07/03/2012, 06/06/2013, 07/03/2014, 06/05/2020, 07/05/2021  ? Influenza, High Dose Seasonal PF 07/05/2016, 05/24/2017, 06/23/2018, 06/15/2019, 06/03/2020  ? Influenza,inj,Quad PF,6+ Mos 07/20/2013  ? Influenza-Unspecified 06/23/2018  ? Moderna Sars-Covid-2 Vaccination 10/17/2019, 11/15/2019, 07/29/2020, 01/26/2021, 08/03/2021  ? Pneumococcal Conjugate-13 03/06/2014  ? Pneumococcal Polysaccharide-23 11/13/2009, 02/18/2010  ? Tetanus 05/20/2013  ? Zoster, Live 11/13/2009, 02/18/2010, 06/13/2017, 09/05/2017  ?  ? ? ?PFT's ?TLC  ?Date Value Ref Range Status  ?12/13/2021 6.12 L Final  ?  ? ? ?CMP  ?   ?Component Value Date/Time  ? NA 138 10/19/2021 0912  ? NA 135 (L) 03/23/2017 1316  ? K 4.3 10/19/2021 0912  ? K 4.5 03/23/2017 1316  ? CL 101 10/19/2021 0912  ? CL 94 (L) 03/20/2013 1510  ? CO2 31 10/19/2021 0912  ? CO2 26 03/23/2017 1316  ? GLUCOSE 88 10/19/2021 0912  ? GLUCOSE 103 03/23/2017 1316  ? GLUCOSE 122 (H) 03/20/2013 1510  ? BUN 23 10/19/2021 0912  ? BUN 17.8 03/23/2017 1316  ? CREATININE 1.14 10/19/2021 0912  ? CREATININE 1.3 03/23/2017 1316  ? CALCIUM 9.0 10/19/2021 0912  ? CALCIUM 9.6 03/23/2017 1316  ? PROT 6.8 10/19/2021 0912  ? PROT 6.5 03/23/2017 1317  ? PROT 6.8 03/23/2017  1316  ? ALBUMIN 3.9 10/19/2021 0912  ? ALBUMIN 3.8 03/23/2017 1316  ? AST 22 10/19/2021 0912  ? AST 29 03/23/2017 1316  ? ALT 24 10/19/2021 0912  ? ALT 28 03/23/2017 1316  ? ALKPHOS 64 10/19/2021 0912  ? ALKPHOS 59 03/23/2017 1316  ? BILITOT 0.4 10/19/2021 0912  ? BILITOT 1.00 03/23/2017 1316  ? GFRNONAA >60 10/19/2021 0912  ? GFRAA >60 03/23/2020 1333  ?  ? ? ?CBC ?   ?Component Value Date/Time  ? WBC 5.3 10/19/2021 0912  ?  RBC 3.62 (L) 10/19/2021 0912  ? HGB 11.5 (L) 10/19/2021 0912  ? HGB 12.0 (L) 03/23/2017 1317  ? HCT 33.2 (L) 10/19/2021 0912  ? HCT 34.0 (L) 03/23/2017 1317  ? PLT 261 10/19/2021 0912  ? PLT 213 03/23/2017 1317  ? MCV 91.7 10/19/2021 0912  ? MCV 95.2 03/23/2017 1317  ? MCH 31.8 10/19/2021 0912  ? MCHC 34.6 10/19/2021 0912  ? RDW 13.4 10/19/2021 0912  ? RDW 12.6 03/23/2017 1317  ? LYMPHSABS 1.5 10/19/2021 0912  ? LYMPHSABS 1.8 03/23/2017 1317  ? MONOABS 0.6 10/19/2021 0912  ? MONOABS 0.7 03/23/2017 1317  ? EOSABS 0.5 10/19/2021 0912  ? EOSABS 0.1 03/23/2017 1317  ? BASOSABS 0.0 10/19/2021 0912  ? BASOSABS 0.0 03/23/2017 1317  ?  ? ? ?LFT's ? ?  Latest Ref Rng & Units 10/19/2021  ?  9:12 AM 03/25/2021  ?  1:29 PM 01/01/2021  ? 11:42 AM  ?Hepatic Function  ?Total Protein 6.5 - 8.1 g/dL 6.8   7.8   7.0    ?Albumin 3.5 - 5.0 g/dL 3.9   4.2   4.1    ?AST 15 - 41 U/L _0 ?ALT 0 - 44 U/L _1 ?Alk Phosphatase 38 - 126 U/L 64   53   49    ?Total Bilirubin 0.3 - 1.2 mg/dL 0.4   0.6   0.5    ?Bilirubin, Direct 0.0 - 0.3 mg/dL   0.1    ?  ?HRCT (11/28/21) - appearance of the lungs is compatible with interstitial lung ?disease, once again categorized as probable usual interstitial pneumonia (UIP) per current ATS guidelines. No progression compared to the prior study from 2021. ? ?Assessment and Plan ? ?Esbriet Medication Management ?Thoroughly counseled patient on the efficacy, mechanism of action, dosing, administration, adverse effects, and monitoring parameters of Esbriet.  Patient verbalized understanding.  He states he is willing to try medication ? ?Goals of Therapy: Will not stop or reverse the progression of ILD. It will slow the progression of ILD.  I reviewed that the CT scan is gold standard for tracking progression and PFTs are used to monitor for symptomatic progression.  ? ?Dosing: Starting dose will be Esbriet 267 mg 1 tablet three times daily for 7 days, then 2 tablets three times daily for 7  days, then 3 tablets three times daily.  Maintenance dose will be 801 mg 1 tablet three times daily if tolerated.  Stressed the importance of taking with meals and space at least 5-6 hours apart to minimize stomach upset.  ? ?Adverse Effects: ?Nausea, vomiting, diarrhea, weight loss ?Abdominal pain ?GERD - does not have significant history ?Sun sensitivity/rash - patient advised to wear sunscreen when exposed to sunlight. He spends significant amount of time in sun and outdoors. He was been heavily encouraged  ?Dizziness ?Fatigue ? ?Monitoring: ?Monitor for  diarrhea, nausea and vomiting, GI perforation, hepatotoxicity  ?Monitor LFTs - baseline, monthly for first 6 months, then every 3 months routinely ? ?Access: ?Approval of Esbriet through: patient assistance ?Rx sent to: [place] ([place]) for [place]: [phone] ?Conference called patient with [facility]. First Esbriet shipment will be received by patient on 01/26/22. Order # [id number] ? ?Medication Reconciliation ?A drug regimen assessment was performed, including review of allergies, interactions, disease-state management, dosing and immunization history. Medications were reviewed with the patient, including name, instructions, indication, goals of therapy, potential side effects, importance of adherence, and safe use. ? ?Immunizations ?Patient has received 5 COVID19 vaccines. ?He is UTD on influenza, zoster, and pneumonia vaccine. ? ?F/u with Dr. Vaughan Browner is scheduled for 03/22/22 ? ?This appointment required 30 minutes of patient care (this includes precharting, chart review, review of results, face-to-face care, etc.). ? ?Thank you for involving pharmacy to assist in providing this patient's care.  ? ?Knox Saliva, PharmD, MPH, BCPS ?Clinical Pharmacist (Rheumatology and Pulmonology) ?

## 2022-01-26 ENCOUNTER — Telehealth: Payer: Self-pay | Admitting: Pharmacist

## 2022-01-26 NOTE — Telephone Encounter (Signed)
Patient left VM this afternoon stating he has not received Esbriet. I advised him that we had conference called pharmacy two weeks ago to schedule shipment of medication arrive to his home today. Advised that he or someone else must be home to sign for medication. He states he has dental visit today.  ? ?I recall when I had conference called patient with pharmacy several weeks ago, we had chosen 01/26/22 as delivery date to work around other doctors' visits. However it seems he scheduled appt on today's date regardless. He does not seem to recall conversation with pharmacy  ? ?I did tell him that the pharmacy will not attempt delivery again tomorrow and that he will have to call the pharmacy again to r/s shipment if he misses package. He verbalized understanding and will do so ? ?Knox Saliva, PharmD, MPH, BCPS ?Clinical Pharmacist (Rheumatology and Pulmonology) ?

## 2022-01-31 ENCOUNTER — Telehealth: Payer: Self-pay | Admitting: Cardiology

## 2022-01-31 ENCOUNTER — Encounter (HOSPITAL_BASED_OUTPATIENT_CLINIC_OR_DEPARTMENT_OTHER): Payer: Self-pay | Admitting: Family

## 2022-01-31 ENCOUNTER — Ambulatory Visit (HOSPITAL_BASED_OUTPATIENT_CLINIC_OR_DEPARTMENT_OTHER): Payer: Medicare Other | Admitting: Family

## 2022-01-31 VITALS — BP 118/74 | Ht 69.0 in | Wt 154.1 lb

## 2022-01-31 DIAGNOSIS — I10 Essential (primary) hypertension: Secondary | ICD-10-CM | POA: Diagnosis not present

## 2022-01-31 DIAGNOSIS — E785 Hyperlipidemia, unspecified: Secondary | ICD-10-CM | POA: Diagnosis not present

## 2022-01-31 DIAGNOSIS — I5022 Chronic systolic (congestive) heart failure: Secondary | ICD-10-CM

## 2022-01-31 DIAGNOSIS — I25118 Atherosclerotic heart disease of native coronary artery with other forms of angina pectoris: Secondary | ICD-10-CM | POA: Diagnosis not present

## 2022-01-31 DIAGNOSIS — I452 Bifascicular block: Secondary | ICD-10-CM

## 2022-01-31 DIAGNOSIS — Z951 Presence of aortocoronary bypass graft: Secondary | ICD-10-CM | POA: Diagnosis not present

## 2022-01-31 NOTE — Patient Instructions (Addendum)
Medication Instructions:  ?Continue your current medications.  ? ?*If you need a refill on your cardiac medications before your next appointment, please call your pharmacy* ? ? ?Lab Work: ?None ordered today.  ? ?Testing/Procedures: ?Your EKG today was stable compared to previous.  ? ?Your provider recommend cardiac PET.  ? ? ?Follow-Up: ?At [facility], you and your health needs are our priority.  As part of our continuing mission to provide you with exceptional heart care, we have created designated Provider Care Teams.  These Care Teams include your primary Cardiologist (physician) and Advanced Practice Providers (APPs -  Physician Assistants and Nurse Practitioners) who all work together to provide you with the care you need, when you need it. ? ?We recommend signing up for the patient portal called "MyChart".  Sign up information is provided on this After Visit Summary.  MyChart is used to connect with patients for Virtual Visits (Telemedicine).  Patients are able to view lab/test results, encounter notes, upcoming appointments, etc.  Non-urgent messages can be sent to your provider as well.   ?To learn more about what you can do with MyChart, go to [email or web address].   ? ?Your next appointment:   ?6 week(s) ? ?The format for your next appointment:   ?In Person ? ?Provider:   ?Peter Martinique, MD or Loel Dubonnet, NP   ? ? ?Other Instructions ? ?How to Prepare for Your Cardiac PET/CT Stress Test: ? ?1. Please do not take these medications before your test:  ? ?Please hold Metoprolol day of  ?Your remaining medications may be taken with water. ? ?2. Nothing to eat or drink, except water, 3 hours prior to arrival time.   ?NO caffeine/decaffeinated products, or chocolate 12 hours prior to arrival. ? ?3. NO perfume, cologne or lotion ? ?4. Total time is 1 to 2 hours; you may want to bring reading material for the waiting time. ? ?5. Please report to Admitting at the [place] Entrance 60  minutes early for your test. ? [place] [place] ? [place], [place] [address] ? ? ?In preparation for your appointment, medication and supplies will be purchased.  Appointment availability is limited, so if you need to cancel or reschedule, please call the Radiology Department at [phone]  24 hours in advance to avoid a cancellation fee of $100.00 ? ?What to Expect After you Arrive: ? ?Once you arrive and check in for your appointment, you will be taken to a preparation room within the Radiology Department.  A technologist or Nurse will obtain your medical history, verify that you are correctly prepped for the exam, and explain the procedure.  Afterwards,  an IV will be started in your arm and electrodes will be placed on your skin for EKG monitoring during the stress portion of the exam. Then you will be escorted to the PET/CT scanner.  There, staff will get you positioned on the scanner and obtain a blood pressure and EKG.  During the exam, you will continue to be connected to the EKG and blood pressure machines.  A small, safe amount of a radioactive tracer will be injected in your IV to obtain a series of pictures of your heart along with an injection of a stress agent.   ? ?After your Exam: ? ?It is recommended that you eat a meal and drink a caffeinated beverage to counter act any effects of the stress agent.  Drink plenty of fluids for the remainder of the day and urinate frequently  for the first couple of hours after the exam.  Your doctor will inform you of your test results within 7-10 business days. ? ?For questions about your test or how to prepare for your test, please call: ?Marchia Bond, Cardiac Imaging Nurse Navigator  ?Gordy Clement, Cardiac Imaging Nurse Navigator ?Office: [phone] ? ? ? ? ?  ?

## 2022-01-31 NOTE — Telephone Encounter (Signed)
ATC patient to determine if he received Esbriet shipment last week. Left VM on his home phone requesting return for update ? ?Knox Saliva, PharmD, MPH, BCPS, CPP ?Clinical Pharmacist (Rheumatology and Pulmonology) ?

## 2022-01-31 NOTE — Progress Notes (Signed)
Office Visit    Patient Name: Mitchell Fields Date of Encounter: 01/31/2022  PCP:  Cleatis Polka., MD   [facility]  Cardiologist:  Peter Swaziland, MD  Advanced Practice Provider:  No care team member to display Electrophysiologist:  None      Chief Complaint    Oc Lemmen is a 86 y.o. male with a hx of CAD s/p CABG, hypertension, hyperlipidemia, monoclonal gammopathy, IPF, anemia of chronic disease presents today for chest pain   Past Medical History    Past Medical History:  Diagnosis Date   Anemia    Arthritis    Back pain    Bursitis of left hip    Coronary artery disease    TWO VESSEL BYPASS SURGERY in January 1995   Diverticulosis    Hyperlipidemia    Hypertension    Monoclonal gammopathy    Neuropathy    Past Surgical History:  Procedure Laterality Date   ADENOIDECTOMY     BACK SURGERY  1985, 1995, 2002   X3   COLONOSCOPY     CORONARY ARTERY BYPASS GRAFT     X2. LIMA GRAFT AND A RIMA GRAFT.   INGUINAL HERNIA REPAIR Left    Negative stress echo  08/11/2009   US ECHOCARDIOGRAPHY  June 2012   Normal EF, mild aortic regurgitation, mild MR, LAE, mildly dilated RV, RAE, increased left atrial pressure, moderate to severe TR and mildly increased  PA systolic pressure.     Allergies  Allergies  Allergen Reactions   Cortisone Other (See Comments)    REACTION: shut adrenal glands down if given too much    History of Present Illness    Mitchell Fields is a 86 y.o. male with a hx of CAD s/p CABG, hypertension, hyperlipidemia, monoclonal gammopathy, IPF, anemia of chronic disease last seen 10/19/2021.  He has a known history of remote MI, CABG in 1995 in [place] (LIMA and RIMA grafts).  Echo June 2012 with normal EF, mild aortic regurgitation, mild MR, mild to moderate LAE, mildly dilated RV, moderate RAE, moderate to severe TR and redundant atrial septum consistent with increased left atrial pressure.  Echo 2014 mild LV dysfunction EF  45-50%, mildly AI/MR.  Saw Dr. Carlyn Reichert in November 2021 for fatigue.  Had been transition from atenolol to Toprol due to decreased LVEF.  Repeat echo unchanged.  He had been diagnosed with IPF via CT and was following with Dr. De Hollingshead.  When last seen 10/19/2021 noted doing well from a cardiac perspective.  Was recovering from bronchitis treated with antibiotics.  He had recently returned from a trip to [place], [place], [place].  He presents today for follow up with his wife.  Very pleasant gentleman who enjoys collecting things in his spare time such as golf balls and light bulbs.  His wife is a retired Engineer, civil (consulting) and predominantly worked in the hospital setting.  He notes one week ago he had an episode of pain under his left arm.  He notes also he had mild chest pressure. Not as severe as prior to CABG but did have to take nitroglycerin and his BP was as high as 200 systolic. Otherwise checking BP only very intermittently. He also notes he has gas pain which causes some chest pain. No new dyspnea, orthopnea, PND, edema.  EKGs/Labs/Other Studies Reviewed:   The following studies were reviewed today:  Echo 03/26/13:- Left ventricle: The cavity size was normal. Wall thickness   was normal. Systolic function was  mildly reduced. The   estimated ejection fraction was in the range of 45% to   50%. There is hypokinesis of the basal-mid inferior   posterior myocardium. Doppler parameters are consistent   with abnormal left ventricular relaxation (grade 1   diastolic dysfunction). - Aortic valve: Mild regurgitation. - Mitral valve: Mild regurgitation. - Pulmonary arteries: PA peak pressure: 35mm Hg (S).   Echo 09/01/20: IMPRESSIONS     1. Left ventricular ejection fraction, by estimation, is 45 to 50%. The  left ventricle has mildly decreased function. The left ventricle  demonstrates regional wall motion abnormalities (see scoring  diagram/findings for description). There is mild  concentric left  ventricular hypertrophy. Left ventricular diastolic  parameters are consistent with Grade I diastolic dysfunction (impaired  relaxation). Elevated left atrial pressure.   2. Right ventricular systolic function is normal. The right ventricular  size is mildly enlarged. There is moderately elevated pulmonary artery  systolic pressure. The estimated right ventricular systolic pressure is  46.4 mmHg.   3. Left atrial size was mildly dilated.   4. Right atrial size was moderately dilated.   5. The mitral valve is normal in structure. Mild to moderate mitral valve  regurgitation. No evidence of mitral stenosis.   6. Tricuspid valve regurgitation is moderate.   7. The aortic valve is normal in structure. Aortic valve regurgitation is  mild. Mild to moderate aortic valve sclerosis/calcification is present,  without any evidence of aortic stenosis.   8. The inferior vena cava is normal in size with <50% respiratory  variability, suggesting right atrial pressure of 8 mmHg.   EKG:  EKG is  ordered today.  The ekg ordered today demonstrates SR 79 bpm with known bifascicular block (RBBB/LAFB).  Recent Labs: 10/19/2021: ALT 24; BUN 23; Creatinine, Ser 1.14; Hemoglobin 11.5; Platelets 261; Potassium 4.3; Sodium 138  Recent Lipid Panel    Component Value Date/Time   CHOL 178 03/23/2020 1333   TRIG 142 03/23/2020 1333   HDL 54 03/23/2020 1333   CHOLHDL 3.3 03/23/2020 1333   VLDL 28 03/23/2020 1333   LDLCALC 96 03/23/2020 1333     Home Medications   Current Meds  Medication Sig   aspirin 81 MG tablet Take 81 mg by mouth daily.   atorvastatin (LIPITOR) 20 MG tablet Take 20 mg by mouth daily.   B Complex Vitamins (VITAMIN B COMPLEX PO) Take 1 tablet by mouth daily.   Cholecalciferol (VITAMIN D PO) Take 1,000 Units by mouth daily.   Ferrous Sulfate (IRON PO) Take by mouth daily.   gabapentin (NEURONTIN) 300 MG capsule Take 300 mg by mouth daily as needed.   HYDROcodone-acetaminophen  (NORCO/VICODIN) 5-325 MG tablet Take 1 tablet by mouth every 6 (six) hours as needed for moderate pain.   metoprolol succinate (TOPROL-XL) 25 MG 24 hr tablet Take 12.5 mg by mouth daily.   Misc Natural Products (GLUCOSAMINE CHOND COMPLEX/MSM) TABS take 1 po qd   Multiple Vitamins-Minerals (ZINC PO) Take by mouth daily.   Pirfenidone (ESBRIET) 267 MG TABS Take 1 tab three times daily for 7 days, then 2 tabs three times daily for 7 days, then 3 tabs three times daily thereafter.   pregabalin (LYRICA) 75 MG capsule as needed.   SYNTHROID 75 MCG tablet Take 75 mcg by mouth daily before breakfast.      Review of Systems      All other systems reviewed and are otherwise negative except as noted above.  Physical Exam    VS:  BP 118/74 (BP Location: Left Arm, Patient Position: Sitting, Cuff Size: Normal)   Ht 5\' 9"  (1.753 m)   Wt 154 lb 1.6 oz (69.9 kg)   BMI 22.76 kg/m  , BMI Body mass index is 22.76 kg/m.  Wt Readings from Last 3 Encounters:  01/31/22 154 lb 1.6 oz (69.9 kg)  12/20/21 159 lb (72.1 kg)  10/19/21 155 lb 9.6 oz (70.6 kg)     GEN: Well nourished, well developed, in no acute distress. HEENT: normal. Neck: Supple, no JVD, carotid bruits, or masses. Cardiac: RRR, no murmurs, rubs, or gallops. No clubbing, cyanosis, edema.  Radials/PT 2+ and equal bilaterally.  Respiratory:  Respirations regular and unlabored, clear to auscultation bilaterally. GI: Soft, nontender, nondistended. MS: No deformity or atrophy. Skin: Warm and dry, no rash. Neuro:  Strength and sensation are intact. Psych: Normal affect.  Assessment & Plan    CAD - He has had 2 episodes of chest pain over the last week requiring nitroglycerin.  Prior CABG 1995. EKG today no acute St/T wave changes though limited evaluation for ischemia given known bifascicular block. Given concern for angina, plan for cardiac PET. Discussed LHC vs cardiac PET and he prefers to proceed with PET.   Shared Decision  Making/Informed Consent The risks [chest pain, shortness of breath, cardiac arrhythmias, dizziness, blood pressure fluctuations, myocardial infarction, stroke/transient ischemic attack, nausea, vomiting, allergic reaction, radiation exposure, metallic taste sensation and life-threatening complications (estimated to be 1 in 10,000)], benefits (risk stratification, diagnosing coronary artery disease, treatment guidance) and alternatives of a cardiac PET stress test were discussed in detail with Mr. Toye and he agrees to proceed.   HLD, LDL goal <70 -continue atorvastatin 20 mg daily.  HTN - BP well controlled. Continue current antihypertensive regimen Toprol 12.5mg  QD.  BP mildly hypotensive today in clinic though he is asymptomatic with no lightheadedness, dizziness.  Mild LV dysfunction / HFrEF - Euvolemic and well compensated on exam.  Not requiring loop diuretic.  Continue Toprol 12.5 mg daily.  Continue low-salt diet, fluid restrict to less than 2 L.  Bifasisular block - Stable finding by EKG. Continue to monitor with periodic EKG. no near-syncope, syncope.  IPF - Follows with Dr. Isaiah Serge   Disposition: Follow up in 6 week(s) with Peter Swaziland, MD or APP.  Signed, Alver Sorrow, NP 01/31/2022, 4:37 PM [facility]

## 2022-01-31 NOTE — Telephone Encounter (Signed)
Spoke to patient he stated he had chest pain this past Sat and Sun.Stated he took 4 NTG on Sat with no relief.He called 911.Stated a ekg was done and was told he was not having a heart attack.He did not go to ED.Stated he had another episode of chest pain yesterday and took NTG x 1 with relief.He has not had any chest pain this morning, but he feels tired,no energy.Advised Dr.Jordan is out of office this week.Appointment scheduled with Laurann Montana NP this afternoon at 3:10 pm at [facility] location.Advised I will make Dr.Jordan aware. ?

## 2022-01-31 NOTE — Telephone Encounter (Signed)
Pt c/o of Chest Pain: STAT if CP now or developed within 24 hours ? ?1. Are you having CP right now?  ?No ? ?2. Are you experiencing any other symptoms (ex. SOB, nausea, vomiting, sweating)?  ?Back pain, Elevated BP--got up to 200/100 on 4/29 ? ?3. How long have you been experiencing CP?  ?Past 1 week, about 2 episodes  ? ?4. Is your CP continuous or coming and going?  ?Coming and going  ? ?5. Have you taken Nitroglycerin?  ?Yes, patient took nitro during both episodes of chest pain and it helped to relieve chest pain and lower BP ? ?

## 2022-02-01 NOTE — Telephone Encounter (Signed)
Received call from patient confirming that he received Esbriet on Thursday, 01/27/22. He took edication on Thursday and Friday. He had to seek emergent care on Sunday due to chest pain. He states that he does not think this was related to Esbriet (which I also agree with). He states that he will restart once he is feeling better - currently reports some weakness and wants to hold off. ? ?Agreed with patient and advised him to reach out to our clinic if he has any unmanageable side effects or concerns. He verbalized understanding. ? ?Knox Saliva, PharmD, MPH, BCPS, CPP ?Clinical Pharmacist (Rheumatology and Pulmonology) ?

## 2022-03-03 ENCOUNTER — Telehealth: Payer: Self-pay | Admitting: Pulmonary Disease

## 2022-03-04 NOTE — Telephone Encounter (Signed)
A PET scan captures imaged from the base of the skull to the thigh- LMTCB to inform the pt of this.

## 2022-03-07 ENCOUNTER — Telehealth (HOSPITAL_COMMUNITY): Payer: Self-pay | Admitting: *Deleted

## 2022-03-07 NOTE — Telephone Encounter (Signed)
Reaching out to patient to offer assistance regarding upcoming cardiac imaging study to see if patient was willing to come in early.  Patient agrees and instructions reviewed. Patient is aware to where to arrive in addition to holding caffeine 12 hours prior and no food three hours prior. He is aware to arrive at 9:30am for his cardiac PET scan.  Gordy Clement RN Navigator Cardiac Imaging [facility] [phone] office [phone] cell

## 2022-03-08 ENCOUNTER — Other Ambulatory Visit (HOSPITAL_COMMUNITY): Payer: Medicare Other

## 2022-03-08 ENCOUNTER — Inpatient Hospital Stay (HOSPITAL_COMMUNITY): Admission: RE | Admit: 2022-03-08 | Payer: Medicare Other | Source: Ambulatory Visit

## 2022-03-08 ENCOUNTER — Ambulatory Visit (HOSPITAL_BASED_OUTPATIENT_CLINIC_OR_DEPARTMENT_OTHER): Payer: Medicare Other | Admitting: Family

## 2022-03-08 ENCOUNTER — Ambulatory Visit (HOSPITAL_COMMUNITY)
Admission: RE | Admit: 2022-03-08 | Discharge: 2022-03-08 | Disposition: A | Discharge status: Home / Self Care | Payer: Medicare Other | Source: Ambulatory Visit | Attending: Family | Admitting: Family

## 2022-03-08 DIAGNOSIS — I25118 Atherosclerotic heart disease of native coronary artery with other forms of angina pectoris: Secondary | ICD-10-CM | POA: Diagnosis not present

## 2022-03-08 LAB — NM PET CT CARDIAC PERFUSION MULTI W/ABSOLUTE BLOODFLOW
LV dias vol: 111 mL (ref 62–150)
LV sys vol: 54 mL
Nuc Rest EF: 44 %
Nuc Stress EF: 51 %
Rest Nuclear Isotope Dose: 18 mCi
ST Depression (mm): 0 mm
Stress Nuclear Isotope Dose: 18 mCi
TID: 1.02

## 2022-03-08 MED ORDER — RUBIDIUM RB82 GENERATOR (RUBYFILL)
18.2500 | PACK | Freq: Once | INTRAVENOUS | Status: AC
Start: 2022-03-08 — End: 2022-03-08
  Administered 2022-03-08: 18.25 via INTRAVENOUS

## 2022-03-08 MED ORDER — RUBIDIUM RB82 GENERATOR (RUBYFILL)
18.0600 | PACK | Freq: Once | INTRAVENOUS | Status: AC
  Administered 2022-03-08: 18.06 via INTRAVENOUS

## 2022-03-09 ENCOUNTER — Encounter (HOSPITAL_BASED_OUTPATIENT_CLINIC_OR_DEPARTMENT_OTHER): Payer: Self-pay

## 2022-03-09 MED ORDER — ISOSORBIDE MONONITRATE ER 30 MG PO TB24: 15.0000 mg | ORAL_TABLET | Freq: Every day | ORAL | 3 refills | Status: DC

## 2022-03-10 ENCOUNTER — Telehealth (HOSPITAL_BASED_OUTPATIENT_CLINIC_OR_DEPARTMENT_OTHER): Payer: Self-pay

## 2022-03-10 NOTE — Telephone Encounter (Addendum)
Seen by patient Mitchell Fields on 03/09/2022 12:41 PM, RN called patient to confirm that he picked up his Imdur and ensure he did not have questions about his results. All questions and patient verbalized understanding.     ----- Message from Loel Dubonnet, NP sent at 03/09/2022  8:33 AM EDT ----- Cardiac PET shows known prior infarct with only mild peri infarct ischemia. Discussed with Dr. Martinique. Recommend medical management and close monitoring of symptoms. Please start Imdur half tablet ('15mg'$ ) every evening. Follow up as scheduled. If despite changes in medicines he is having worsening chest pain, will need to consider cardiac catheterization

## 2022-03-15 ENCOUNTER — Inpatient Hospital Stay (HOSPITAL_COMMUNITY): Admission: RE | Admit: 2022-03-15 | Payer: Medicare Other | Source: Ambulatory Visit

## 2022-03-21 ENCOUNTER — Ambulatory Visit (INDEPENDENT_AMBULATORY_CARE_PROVIDER_SITE_OTHER): Payer: Medicare Other

## 2022-03-21 ENCOUNTER — Ambulatory Visit (HOSPITAL_BASED_OUTPATIENT_CLINIC_OR_DEPARTMENT_OTHER): Payer: Medicare Other | Admitting: Family

## 2022-03-21 ENCOUNTER — Encounter (HOSPITAL_BASED_OUTPATIENT_CLINIC_OR_DEPARTMENT_OTHER): Payer: Self-pay | Admitting: Family

## 2022-03-21 VITALS — BP 110/60 | HR 65 | Ht 69.0 in | Wt 157.0 lb

## 2022-03-21 DIAGNOSIS — I25118 Atherosclerotic heart disease of native coronary artery with other forms of angina pectoris: Secondary | ICD-10-CM

## 2022-03-21 DIAGNOSIS — I5022 Chronic systolic (congestive) heart failure: Secondary | ICD-10-CM

## 2022-03-21 DIAGNOSIS — R002 Palpitations: Secondary | ICD-10-CM

## 2022-03-21 DIAGNOSIS — Z951 Presence of aortocoronary bypass graft: Secondary | ICD-10-CM

## 2022-03-21 DIAGNOSIS — I10 Essential (primary) hypertension: Secondary | ICD-10-CM | POA: Diagnosis not present

## 2022-03-21 DIAGNOSIS — I452 Bifascicular block: Secondary | ICD-10-CM

## 2022-03-21 DIAGNOSIS — E785 Hyperlipidemia, unspecified: Secondary | ICD-10-CM | POA: Diagnosis not present

## 2022-03-21 NOTE — Progress Notes (Signed)
Office Visit    Patient Name: Mitchell Fields Date of Encounter: 03/21/2022  PCP:  Ginger Organ., MD   [facility]  Cardiologist:  Peter Martinique, MD  Advanced Practice Provider:  No care team member to display Electrophysiologist:  None      Chief Complaint    Mitchell Fields is a 86 y.o. male with a hx of CAD s/p CABG, hypertension, hyperlipidemia, monoclonal gammopathy, IPF, anemia of chronic disease presents today for follow-up after cardiac PET  Past Medical History    Past Medical History:  Diagnosis Date   Anemia    Arthritis    Back pain    Bursitis of left hip    Coronary artery disease    TWO VESSEL BYPASS SURGERY in January 1995   Diverticulosis    Hyperlipidemia    Hypertension    Monoclonal gammopathy    Neuropathy    Past Surgical History:  Procedure Laterality Date   ADENOIDECTOMY     BACK SURGERY  1985, 1995, 2002   X3   COLONOSCOPY     CORONARY ARTERY BYPASS GRAFT     X2. LIMA GRAFT AND A RIMA GRAFT.   INGUINAL HERNIA REPAIR Left    Negative stress echo  08/11/2009   US ECHOCARDIOGRAPHY  June 2012   Normal EF, mild aortic regurgitation, mild MR, LAE, mildly dilated RV, RAE, increased left atrial pressure, moderate to severe TR and mildly increased  PA systolic pressure.     Allergies  Allergies  Allergen Reactions   Cortisone Other (See Comments)    REACTION: shut adrenal glands down if given too much    History of Present Illness    Mitchell Fields is a 86 y.o. male with a hx of CAD s/p CABG, hypertension, hyperlipidemia, monoclonal gammopathy, IPF, anemia of chronic disease last seen 01/31/2022  He has a known history of remote MI, CABG in 1995 in [place] (LIMA and RIMA grafts).  Echo June 2012 with normal EF, mild aortic regurgitation, mild MR, mild to moderate LAE, mildly dilated RV, moderate RAE, moderate to severe TR and redundant atrial septum consistent with increased left atrial pressure.  Echo 2014 mild LV  dysfunction EF 45-50%, mildly AI/MR.  Saw Dr. Rise Mu in November 2021 for fatigue.  Had been transition from atenolol to Toprol due to decreased LVEF.  Repeat echo unchanged.  He had been diagnosed with IPF via CT and was following with Dr. Vaughan Browner.  Seen 10/19/2021 doing well from cardiac perspective.  Was recovering from bronchitis with antibiotics.  He was seen 01/31/2022 with 2 episodes of chest pain.  Cardiac PET scan ordered and performed 6 6/23 revealing prior MI with small peri-infarct ischemia.  It was moderate high risk in the presence of reduced LVEF and infarction size.  LVEF 44%.  Study detailed below.  Discussed with Dr. Martinique who recommended optimization of antianginal therapy.  Presents today for follow-up.  Reviewed cardiac PET.  He was reassured.  He does note fatigue.  He works out twice per week Mondays and Wednesdays for the 1 hour exercise class and then just feels worn out.  He also notes sensation of feeling his heartbeat irregularly.  No recurrent chest pain.   Blood pressure at home since this month has been 96/62-139/85 with most readings 100s over 60s.  Denies lightheadedness, dizziness, near ischemia, syncope.  Does note he had elevated blood pressure late April when he started taking Espiret with home readings as high as  200/100.  He did take intermittently in May and blood pressure readings remained well controlled.  He has not been taking and has upcoming follow up with Dr. Vaughan Browner. No new dyspnea, orthopnea, PND, edema.  EKGs/Labs/Other Studies Reviewed:   The following studies were reviewed today  Cardiac PET 03/08/2022  Narrative & Impression      Findings are consistent with prior myocardial infarction with peri-infarct ischemia. The study is moderate-high risk in the presence reduced LVEF and infarction size.   LV perfusion is abnormal. There is a perfusion defect in the mid & basal inferolateral and basal inferior that worsens with stress and is associated with  hypokinesis. This is a moderate (3-6 segments) size and moderate (10-20% of LV myocardium) severity lesion. Consistent with infarction with small peri-infarct ischemia.   There is no evidence of transient ischemic dilation, TID 1.02   Rest left ventricular function is low normal (normal for modality). Rest EF: 44 %. Stress left ventricular function is normal. Stress EF: 51 %. End diastolic cavity size is normal. End systolic cavity size is normal.   LVEF Reserve is normal at 7%   Myocardial blood flow reserve is not reported in this patient due to technical or patient-specific concerns that affect accuracy (Prior CABG)   Coronary calcium was present on the attenuation correction CT images. Severe coronary calcifications were present. Coronary calcifications were present in the left anterior descending artery, left circumflex artery and right coronary artery distribution(s). Aortic atherosclerosis noted.  Aortic valve calcification noted.   Electronically signed by Rudean Haskell MD     Echo 03/26/13:- Left ventricle: The cavity size was normal. Wall thickness   was normal. Systolic function was mildly reduced. The   estimated ejection fraction was in the range of 45% to   50%. There is hypokinesis of the basal-mid inferior   posterior myocardium. Doppler parameters are consistent   with abnormal left ventricular relaxation (grade 1   diastolic dysfunction). - Aortic valve: Mild regurgitation. - Mitral valve: Mild regurgitation. - Pulmonary arteries: PA peak pressure: 65m Hg (S).   Echo 09/01/20: IMPRESSIONS     1. Left ventricular ejection fraction, by estimation, is 45 to 50%. The  left ventricle has mildly decreased function. The left ventricle  demonstrates regional wall motion abnormalities (see scoring  diagram/findings for description). There is mild  concentric left ventricular hypertrophy. Left ventricular diastolic  parameters are consistent with Grade I diastolic  dysfunction (impaired  relaxation). Elevated left atrial pressure.   2. Right ventricular systolic function is normal. The right ventricular  size is mildly enlarged. There is moderately elevated pulmonary artery  systolic pressure. The estimated right ventricular systolic pressure is  465.4mmHg.   3. Left atrial size was mildly dilated.   4. Right atrial size was moderately dilated.   5. The mitral valve is normal in structure. Mild to moderate mitral valve  regurgitation. No evidence of mitral stenosis.   6. Tricuspid valve regurgitation is moderate.   7. The aortic valve is normal in structure. Aortic valve regurgitation is  mild. Mild to moderate aortic valve sclerosis/calcification is present,  without any evidence of aortic stenosis.   8. The inferior vena cava is normal in size with <50% respiratory  variability, suggesting right atrial pressure of 8 mmHg.   EKG: No EKG today  Recent Labs: 10/19/2021: ALT 24; BUN 23; Creatinine, Ser 1.14; Hemoglobin 11.5; Platelets 261; Potassium 4.3; Sodium 138  Recent Lipid Panel    Component Value Date/Time  CHOL 178 03/23/2020 1333   TRIG 142 03/23/2020 1333   HDL 54 03/23/2020 1333   CHOLHDL 3.3 03/23/2020 1333   VLDL 28 03/23/2020 1333   [place] 96 03/23/2020 1333     Home Medications   No outpatient medications have been marked as taking for the 03/21/22 encounter (Appointment) with Loel Dubonnet, NP.     Review of Systems      All other systems reviewed and are otherwise negative except as noted above.  Physical Exam    VS:  There were no vitals taken for this visit. , BMI There is no height or weight on file to calculate BMI.  Wt Readings from Last 3 Encounters:  01/31/22 154 lb 1.6 oz (69.9 kg)  12/20/21 159 lb (72.1 kg)  10/19/21 155 lb 9.6 oz (70.6 kg)    GEN: Well nourished, well developed, in no acute distress. HEENT: normal. Neck: Supple, no JVD, carotid bruits, or masses. Cardiac: RRR, no murmurs, rubs,  or gallops. No clubbing, cyanosis, edema.  Radials/PT 2+ and equal bilaterally.  Respiratory:  Respirations regular and unlabored, clear to auscultation bilaterally. GI: Soft, nontender, nondistended. MS: No deformity or atrophy. Skin: Warm and dry, no rash. Neuro:  Strength and sensation are intact. Psych: Normal affect.  Assessment & Plan    CAD -  Prior CABG 1995.  Cardiac PET 03/08/2022 consistent with prior MI and small peri-infarct ischemia with EF 44%.  Discussed with Dr. Martinique recommended optimization of medical therapy.  He was started on Imdur 15 mg daily.  He is relatively hypotensive but asymptomatic with no lightheadedness or dizziness.  Stable on low-dose Toprol 12.5 mg daily and will not further increase due to bifascicular block.  Continue atorvastatin, aspirin.  HLD, LDL goal <70 - 05/2021 LDL 84. continue atorvastatin 20 mg daily.  He is hesitant regarding increasing statin therapy.  Previously tolerated higher doses as well as Zetia.  He started exercising in January.  Plan to repeat lipid panel in 2 months prior to follow-up.  If LDL not at goal less than 70 will need increased dose atorvastatin versus addition of Zetia.  HTN - BP well controlled. Continue current antihypertensive regimen Toprol 12.'5mg'$  QD, Imdur 15 mg daily.  BP mildly hypotensive today in clinic though he is asymptomatic with no lightheadedness, dizziness.  Mild LV dysfunction / HFrEF - Euvolemic and well compensated on exam.  Not requiring loop diuretic.  Continue Toprol 12.5 mg daily.  Continue low-salt diet, fluid restrict to less than 2 L.  GDMT limited by relative hypotension.  Bifasisular block /palpitations- Continue to monitor with periodic EKG. no near-syncope, syncope.  Does note sensation of irregular heart rate, palpitations and as such 7-day ZIO monitor placed in clinic.  He wonders whether his fatigue is related to his low heart rate-monitor will allow for [place] to assess for chronotropic incompetence  as heart rate should escalate during his exercise class Mondays and Wednesdays at 9 AM.  IPF - Follows with Dr. Vaughan Browner   Disposition: Follow up in 3 months with Peter Martinique, MD or APP.  Signed, Loel Dubonnet, NP 03/21/2022, 10:13 AM [place]

## 2022-03-21 NOTE — Patient Instructions (Addendum)
Medication Instructions:  Continue your current medications.   *If you need a refill on your cardiac medications before your next appointment, please call your pharmacy*   Lab Work: Your physician recommends that you return for lab work one week prior to visit with Dr. Martinique for fasting lipid panel and CMP.   Please return for Lab work. You may come to the...   [facility] (3rd floor) [address] [place], [place] [address]  Open: 8am-Noon and 1pm-4:30pm  Please ring the doorbell on the small table when you exit the elevator and the Lab Tech will come get you  [place] [facility] [address] [place], [place], [place] [address] Open: 8am-1pm, then 2pm-4:30pm   [place]- Please see attached locations sheet stapled to your lab work with address and hours.    If you have labs (blood work) drawn today and your tests are completely normal, you will receive your results only by: [place] (if you have MyChart) OR A paper copy in the mail If you have any lab test that is abnormal or we need to change your treatment, we will call you to review the results.   Testing/Procedures: Your physician has recommended that you wear a Zio monitor for 7 days.   This monitor is a medical device that records the heart's electrical activity. Doctors most often use these monitors to diagnose arrhythmias. Arrhythmias are problems with the speed or rhythm of the heartbeat. The monitor is a small device applied to your chest. You can wear one while you do your normal daily activities. While wearing this monitor if you have any symptoms to push the button and record what you felt. Once you have worn this monitor for the period of time provider prescribed (Usually 14 days), you will return the monitor device in the postage paid box. Once it is returned they will download the data collected and provide [place] with a report which the provider will then review and  we will call you with those results. Important tips:  Avoid showering during the first 24 hours of wearing the monitor. Avoid excessive sweating to help maximize wear time. Do not submerge the device, no hot tubs, and no swimming pools. Keep any lotions or oils away from the patch. After 24 hours you may shower with the patch on. Take brief showers with your back facing the shower head.  Do not remove patch once it has been placed because that will interrupt data and decrease adhesive wear time. Push the button when you have any symptoms and write down what you were feeling. Once you have completed wearing your monitor, remove and place into box which has postage paid and place in your outgoing mailbox.  If for some reason you have misplaced your box then call our office and we can provide another box and/or mail it off for you.      Follow-Up: At [facility], you and your health needs are our priority.  As part of our continuing mission to provide you with exceptional heart care, we have created designated Provider Care Teams.  These Care Teams include your primary Cardiologist (physician) and Advanced Practice Providers (APPs -  Physician Assistants and Nurse Practitioners) who all work together to provide you with the care you need, when you need it.  We recommend signing up for the patient portal called "MyChart".  Sign up information is provided on this After Visit Summary.  MyChart is used to connect  with patients for Virtual Visits (Telemedicine).  Patients are able to view lab/test results, encounter notes, upcoming appointments, etc.  Non-urgent messages can be sent to your provider as well.   To learn more about what you can do with MyChart, go to [email or web address].    Your next appointment:   3 month(s)  The format for your next appointment:   In Person  Provider:   Peter Martinique, MD {  Other Instructions  Isosorbide Mononitrate Extended-Release Tablets What is  this medication? ISOSORBIDE MONONITRATE (eye soe SOR bide mon oh NYE trate) prevents chest pain (angina). It works by relaxing blood vessels, which decreases the amount of work the heart has to do. It belongs to a group of medications called nitrates. Do not use it to treat sudden chest pain. This medicine may be used for other purposes; ask your health care provider or pharmacist if you have questions. COMMON BRAND NAME(S): Imdur, Isotrate ER What should I tell my care team before I take this medication? They need to know if you have any of these conditions: Previous heart attack or heart failure An unusual or allergic reaction to isosorbide mononitrate, nitrates, other medications, foods, dyes, or preservatives Pregnant or trying to get pregnant Breast-feeding How should I use this medication? Take this medication by mouth with a glass of water. Follow the directions on the prescription label. Do not crush or chew. Take your medication at regular intervals. Do not take your medication more often than directed. Do not stop taking this medication except on the advice of your care team. Talk to your care team about the use of this medication in children. Special care may be needed. Overdosage: If you think you have taken too much of this medicine contact a poison control center or emergency room at once. NOTE: This medicine is only for you. Do not share this medicine with others. What if I miss a dose? If you miss a dose, take it as soon as you can. If it is almost time for your next dose, take only that dose. Do not take double or extra doses. What may interact with this medication? Do not take this medication with any of the following: Medications used to treat erectile dysfunction (ED) like avanafil, sildenafil, tadalafil, and vardenafil [place] This medication may also interact with the following: Medications for high blood pressure Other medications for angina or heart failure This list  may not describe all possible interactions. Give your health care provider a list of all the medicines, herbs, non-prescription drugs, or dietary supplements you use. Also tell them if you smoke, drink alcohol, or use illegal drugs. Some items may interact with your medicine. What should I watch for while using this medication? Check your heart rate and blood pressure regularly while you are taking this medication. Ask your care team what your heart rate and blood pressure should be and when you should contact him or her. Tell your care team if you feel your medication is no longer working. You may get dizzy. Do not drive, use machinery, or do anything that needs mental alertness until you know how this medication affects you. To reduce the risk of dizzy or fainting spells, do not sit or stand up quickly, especially if you are an older patient. Alcohol can make you more dizzy, and increase flushing and rapid heartbeats. Avoid alcoholic drinks. Do not treat yourself for coughs, colds, or pain while you are taking this medication without asking your care team for advice. Some  ingredients may increase your blood pressure. What side effects may I notice from receiving this medication? Side effects that you should report to your care team as soon as possible: Allergic reactions--skin rash, itching, hives, swelling of the face, lips, tongue, or throat Headache, unusual weakness or fatigue, shortness of breath, nausea, vomiting, rapid heartbeat, blue skin or lips, which may be signs of methemoglobinemia Increased pressure around the brain--severe headache, blurry vision, change in vision, nausea, vomiting Low blood pressure--dizziness, feeling faint or lightheaded, blurry vision Slow heartbeat--dizziness, feeling faint or lightheaded, confusion, trouble breathing, unusual weakness or fatigue Worsening chest pain (angina)--pain, pressure, or tightness in the chest, neck, back, or arms Side effects that usually  do not require medical attention (report to your care team if they continue or are bothersome): Dizziness Flushing Headache This list may not describe all possible side effects. Call your doctor for medical advice about side effects. You may report side effects to FDA at 1-800-FDA-1088. Where should I keep my medication? Keep out of the reach of children. Store between 15 and 30 degrees C (59 and 86 degrees F). Keep container tightly closed. Throw away any unused medication after the expiration date. NOTE: This sheet is a summary. It may not cover all possible information. If you have questions about this medicine, talk to your doctor, pharmacist, or health care provider.  2023 Elsevier/Gold Standard (2021-02-03 00:00:00)

## 2022-03-22 ENCOUNTER — Ambulatory Visit: Payer: Medicare Other | Admitting: Pulmonary Disease

## 2022-03-25 ENCOUNTER — Other Ambulatory Visit: Payer: Medicare Other

## 2022-03-28 ENCOUNTER — Ambulatory Visit: Payer: Medicare Other | Admitting: Pulmonary Disease

## 2022-03-28 ENCOUNTER — Encounter: Payer: Self-pay | Admitting: Pulmonary Disease

## 2022-03-28 VITALS — BP 126/64 | HR 71 | Temp 98.2°F | Ht 69.0 in | Wt 156.8 lb

## 2022-03-28 DIAGNOSIS — Z7189 Other specified counseling: Secondary | ICD-10-CM

## 2022-03-28 DIAGNOSIS — Z79899 Other long term (current) drug therapy: Secondary | ICD-10-CM | POA: Diagnosis not present

## 2022-03-28 DIAGNOSIS — J84112 Idiopathic pulmonary fibrosis: Secondary | ICD-10-CM

## 2022-03-28 NOTE — Progress Notes (Signed)
Mitchell Fields    [id number]    [birth year]  Primary Care Physician:Shaw, Netta Corrigan., MD  Referring Physician: Cleatis Polka., MD [address] [place],  [place] [address]  Chief complaint: Follow-up for interstitial lung disease, pulmonary fibrosis  HPI: 86 year old with history of monoclonal gammopathy, hypertension, hyperlipidemia, coronary artery disease Referred for evaluation of pulmonary fibrosis noted on recent CT chest  He follows with Dr. Bertis Ruddy for monoclonal gammopathy and had a CT chest abdomen pelvis for evaluation of unexplained weight loss.  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  He is scheduled for a colonoscopy later this month for work-up of weight loss.   Chief complaint is fatigue, chronic dyspnea on exertion which is worsening gradually over the past few years. He had a consultation with Dr. Dimple Casey, rheumatology for elevated CCP.  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  No mold, hot tub, Jacuzzi.  No feather pillows or comforters ILD questionnaire 01/01/2021-negative except as noted above Smoking history: 10-pack-year smoker.  Quit in 1969 Travel history: Previously lived in [place] and [place]. [place].  No significant recent travel Relevant family history: Dad had lung cancer.  He was a smoker.  Interim history:  Started Esbriet in April 2023.  He had issues with high blood pressure and fatigue with the medication.  He stopped taking Esbriet in early June.  He is working with his cardiologist to get a heart evaluation and recently had a PET scan of the heart.  Breathing is stable with no new issues.  Outpatient Encounter Medications as of 03/28/2022  Medication Sig   aspirin 81 MG tablet Take 81 mg by mouth daily.   atorvastatin (LIPITOR) 20 MG tablet Take  20 mg by mouth daily.   B Complex Vitamins (VITAMIN B COMPLEX PO) Take 1 tablet by mouth daily.   Cholecalciferol (VITAMIN D PO) Take 1,000 Units by mouth daily.   Ferrous Sulfate (IRON PO) Take by mouth daily.   gabapentin (NEURONTIN) 300 MG capsule Take 300 mg by mouth daily as needed.   HYDROcodone-acetaminophen (NORCO/VICODIN) 5-325 MG tablet Take 1 tablet by mouth every 6 (six) hours as needed for moderate pain.   isosorbide mononitrate (IMDUR) 30 MG 24 hr tablet Take 0.5 tablets (15 mg total) by mouth daily.   metoprolol succinate (TOPROL-XL) 25 MG 24 hr tablet Take 12.5 mg by mouth daily.   Misc Natural Products (GLUCOSAMINE CHOND COMPLEX/MSM) TABS take 1 po qd   Multiple Vitamins-Minerals (ZINC PO) Take by mouth daily.   pregabalin (LYRICA) 75 MG capsule as needed.   SYNTHROID 75 MCG tablet Take 75 mcg by mouth daily before breakfast.    nitroGLYCERIN (NITROSTAT) 0.4 MG SL tablet Place 1 tablet (0.4 mg total) under the tongue every 5 (five) minutes as needed for chest pain.   Pirfenidone (ESBRIET) 267 MG TABS Take 1 tab three times daily for 7 days, then 2 tabs three times daily for 7 days, then 3 tabs three times daily thereafter. (Patient not taking: Reported on 03/28/2022)   No facility-administered encounter medications on file as of 03/28/2022.    Physical Exam: Blood pressure 126/64, pulse 71, temperature 98.2 F (36.8 C), temperature source Oral, height 5\' 9"  (1.753 m), weight 156 lb 12.8 oz (71.1 kg), SpO2 98 %. Gen:  No acute distress HEENT:  EOMI, sclera anicteric Neck:     No masses; no thyromegaly Lungs:    Bibasal crackles CV:         Regular rate and rhythm; no murmurs Abd:      + bowel sounds; soft, non-tender; no palpable masses, no distension Ext:    No edema; adequate peripheral perfusion Skin:      Warm and dry; no rash Neuro: alert and oriented x 3 Psych: normal mood and affect   Data Reviewed: Imaging: CT chest 03/20/2015-emphysema, subpleural  reticulation CT chest 08/21/2020-basilar predominant fibrotic ILD, probable UIP pattern.  Progressive since 2016.  Diverticulosis in the abdomen, atherosclerosis. CT high-resolution 11/28/2021-ILD and probable UIP pattern.  Stable compared to 2020 I have reviewed the images personally.  PFTs: 01/01/2021 FVC 3.38 [89%], FEV1 2.43 [91%], F/F 72, TLC 6.07 [88%], DLCO 12.61 [53%] Moderate-severe diffusion defect  12/13/2021 FVC 3.23 (86%), FEV1 2.31 (88%), F/F 72, TLC 6.12 (89%), DLCO 12.07 (51%) Moderately severe Diffusion Defect Lung volumes are stable but diffusion capacity is slightly down compared to April 2022  Labs: CTD serologies 10/08/2020-significant for CCP 120. Rest of the labs are negative  Cardiac: Echocardiogram 09/01/2020 LVEF 45-50%, mild concentric LVH, grade 1 diastolic dysfunction, moderately elevated PA systolic pressure, RVSP 46.4  Assessment:  Pulmonary fibrosis Progressive in nature with probable UIP pattern.  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  This may be from Esbriet side effects but he is also getting a cardiac work-up We discussed alternate medications such as Ofev and have decided to give Esbriet one more chance.  He will initiate at low-dose and titrate slowly by every 2 weeks  Plan/Recommendations: Check labs for monitoring Resume Esbriet  Chilton Greathouse MD [facility] 03/28/2022, 3:34 PM  CC: Cleatis Polka., MD

## 2022-03-29 LAB — CBC WITH DIFFERENTIAL/PLATELET
Basophils Absolute: 0 10*3/uL (ref 0.0–0.1)
Basophils Relative: 0.6 % (ref 0.0–3.0)
Eosinophils Absolute: 0.2 10*3/uL (ref 0.0–0.7)
Eosinophils Relative: 2.8 % (ref 0.0–5.0)
HCT: 33.6 % — ABNORMAL LOW (ref 39.0–52.0)
Hemoglobin: 12.1 g/dL — ABNORMAL LOW (ref 13.0–17.0)
Lymphocytes Relative: 28.2 % (ref 12.0–46.0)
Lymphs Abs: 2.1 10*3/uL (ref 0.7–4.0)
MCHC: 36 g/dL (ref 30.0–36.0)
MCV: 91.4 fl (ref 78.0–100.0)
Monocytes Absolute: 0.8 10*3/uL (ref 0.1–1.0)
Monocytes Relative: 10.1 % (ref 3.0–12.0)
Neutro Abs: 4.4 10*3/uL (ref 1.4–7.7)
Neutrophils Relative %: 58.3 % (ref 43.0–77.0)
Platelets: 274 10*3/uL (ref 150.0–400.0)
RBC: 3.67 Mil/uL — ABNORMAL LOW (ref 4.22–5.81)
RDW: 13.4 % (ref 11.5–15.5)
WBC: 7.5 10*3/uL (ref 4.0–10.5)

## 2022-03-29 LAB — COMPREHENSIVE METABOLIC PANEL
ALT: 24 U/L (ref 0–53)
AST: 23 U/L (ref 0–37)
Albumin: 4.2 g/dL (ref 3.5–5.2)
Alkaline Phosphatase: 56 U/L (ref 39–117)
BUN: 26 mg/dL — ABNORMAL HIGH (ref 6–23)
CO2: 29 mEq/L (ref 19–32)
Calcium: 9.2 mg/dL (ref 8.4–10.5)
Chloride: 97 mEq/L (ref 96–112)
Creatinine, Ser: 1.3 mg/dL (ref 0.40–1.50)
GFR: 50.77 mL/min — ABNORMAL LOW (ref >60.00)
Glucose, Bld: 111 mg/dL — ABNORMAL HIGH (ref 70–99)
Potassium: 4.6 mEq/L (ref 3.5–5.1)
Sodium: 134 mEq/L — ABNORMAL LOW (ref 135–145)
Total Bilirubin: 0.4 mg/dL (ref 0.2–1.2)
Total Protein: 6.7 g/dL (ref 6.0–8.3)

## 2022-04-01 ENCOUNTER — Ambulatory Visit: Payer: Medicare Other | Admitting: Hematology and Oncology

## 2022-04-04 ENCOUNTER — Telehealth (HOSPITAL_BASED_OUTPATIENT_CLINIC_OR_DEPARTMENT_OTHER): Payer: Self-pay

## 2022-04-04 NOTE — Telephone Encounter (Addendum)
Left message for patient to call back     ----- Message from Loel Dubonnet, NP sent at 04/04/2022  8:39 AM EDT ----- Monitor with predominantly normal sinus rhythm. There were episodes of a fast heart rhythm called SVT. Also early beats in the top chamber of the heart called PACs 6.5% of the time and in the bottom chambers of the heart called PVC's 5.8% of the time. These are likely contributing to his fatigue and palpitations. Would recommend referral to EP.

## 2022-04-06 ENCOUNTER — Telehealth (HOSPITAL_BASED_OUTPATIENT_CLINIC_OR_DEPARTMENT_OTHER): Payer: Self-pay

## 2022-04-06 NOTE — Telephone Encounter (Addendum)
Left message for patient to call back     ----- Message from Loel Dubonnet, NP sent at 04/04/2022  8:39 AM EDT ----- Monitor with predominantly normal sinus rhythm. There were episodes of a fast heart rhythm called SVT. Also early beats in the top chamber of the heart called PACs 6.5% of the time and in the bottom chambers of the heart called PVC's 5.8% of the time. These are likely contributing to his fatigue and palpitations. Would recommend referral to EP.

## 2022-04-07 ENCOUNTER — Telehealth (HOSPITAL_BASED_OUTPATIENT_CLINIC_OR_DEPARTMENT_OTHER): Payer: Self-pay

## 2022-04-07 NOTE — Telephone Encounter (Addendum)
3rd call attempt, no answer, results mailed to patient, referral not placed, letter will ask patient to call us    ----- Message from Loel Dubonnet, NP sent at 04/04/2022  8:39 AM EDT ----- Monitor with predominantly normal sinus rhythm. There were episodes of a fast heart rhythm called SVT. Also early beats in the top chamber of the heart called PACs 6.5% of the time and in the bottom chambers of the heart called PVC's 5.8% of the time. These are likely contributing to his fatigue and palpitations. Would recommend referral to EP.

## 2022-04-18 ENCOUNTER — Other Ambulatory Visit: Payer: Self-pay | Admitting: Pharmacist

## 2022-04-18 DIAGNOSIS — J84112 Idiopathic pulmonary fibrosis: Secondary | ICD-10-CM

## 2022-04-18 MED ORDER — PIRFENIDONE 267 MG PO TABS: 801.0000 mg | ORAL_TABLET | Freq: Three times a day (TID) | ORAL | 2 refills | Status: DC

## 2022-04-18 NOTE — Telephone Encounter (Signed)
Refill sent for ESBRIET to [facility], dispensing) for Esbriet: [phone]  Dose: 801 mg three times daily  Last OV: 03/28/22 Provider: Dr. Vaughan Browner   Next OV: 06/29/22  Labs on 03/28/22 wnl.  Left VM for patient to advise  Knox Saliva, PharmD, MPH, BCPS Clinical Pharmacist (Rheumatology and Pulmonology)

## 2022-04-19 ENCOUNTER — Encounter: Payer: Self-pay | Admitting: *Deleted

## 2022-05-25 ENCOUNTER — Telehealth: Payer: Self-pay | Admitting: Cardiology

## 2022-05-25 NOTE — Telephone Encounter (Signed)
Patient called stating he has an annual appointment with his doctor on 8/24 and will be doing lab work on 8/31.  Patient wants to know if he will also need to do blood work for his appointment with Dr. Martinique.

## 2022-05-25 NOTE — Telephone Encounter (Signed)
Returned call to patient who states that he is having CMP and Lipid panel drawn at his PCP this month that Dr. Martinique requested and wanted to know if he still needed to come in for September appointment with Dr. Martinique and have these redrawn. Advised patient to just ensure labs were faxed over to [place] from PCP and that he should not get these re drawn since they will be done at his PCP. Patient is aware and verbalized understanding. Will forward to Dr. Doug Sou nurse to be aware of incoming labs.   Advised patient to call back to office with any issues, questions, or concerns. Patient verbalized understanding.

## 2022-06-22 NOTE — Progress Notes (Unsigned)
Office Visit    Patient Name: Mitchell Fields Date of Encounter: 06/27/2022  PCP:  Ginger Organ., MD   [facility]  Cardiologist:  Betsi Crespi Martinique, MD  Advanced Practice Provider:  No care team member to display Electrophysiologist:  None      Chief Complaint    Mitchell Fields is a 86 y.o. male with a hx of CAD s/p CABG, hypertension, hyperlipidemia, monoclonal gammopathy, IPF, anemia of chronic disease presents today for follow-up   Past Medical History    Past Medical History:  Diagnosis Date   Anemia    Arthritis    Back pain    Bursitis of left hip    Coronary artery disease    TWO VESSEL BYPASS SURGERY in January 1995   Diverticulosis    Hyperlipidemia    Hypertension    Monoclonal gammopathy    Neuropathy    Past Surgical History:  Procedure Laterality Date   ADENOIDECTOMY     BACK SURGERY  1985, 1995, 2002   X3   COLONOSCOPY     CORONARY ARTERY BYPASS GRAFT     X2. LIMA GRAFT AND A RIMA GRAFT.   INGUINAL HERNIA REPAIR Left    Negative stress echo  08/11/2009   US ECHOCARDIOGRAPHY  June 2012   Normal EF, mild aortic regurgitation, mild MR, LAE, mildly dilated RV, RAE, increased left atrial pressure, moderate to severe TR and mildly increased  PA systolic pressure.     Allergies  Allergies  Allergen Reactions   Cortisone Other (See Comments)    REACTION: shut adrenal glands down if given too much    History of Present Illness    Mitchell Fields is a 86 y.o. male with a hx of CAD s/p CABG, hypertension, hyperlipidemia, monoclonal gammopathy, IPF, anemia of chronic disease last seen 01/31/2022  He has a known history of remote MI, CABG in 1995 in [place] (LIMA and RIMA grafts).  Echo June 2012 with normal EF, mild aortic regurgitation, mild MR, mild to moderate LAE, mildly dilated RV, moderate RAE, moderate to severe TR and redundant atrial septum consistent with increased left atrial pressure.  Echo 2014 mild LV dysfunction EF  45-50%, mildly AI/MR.  Saw Dr. Rise Mu in November 2021 for fatigue.  Had been transition from atenolol to Toprol due to decreased LVEF.  Repeat echo unchanged.  He had been diagnosed with IPF via CT and was following with Dr. Vaughan Browner. [name] on [place] but unable to tolerate due to marked fatigue.   Seen 10/19/2021 doing well from cardiac perspective.  Was recovering from bronchitis with antibiotics.  He was seen 01/31/2022 with 2 episodes of chest pain.  Cardiac PET scan ordered and performed 6 6/23 revealing prior MI with mild  peri-infarct ischemia.  It was moderate high risk in the presence of reduced LVEF and infarction size.  LVEF 44%.  Study detailed below.  We recommended optimization of antianginal therapy. Imdur was added. Since then he denies any chest pain.  He also wore a Zio patch monitor which showed brief nonsustained runs of SVT and frequent PACs and PVCs. These were all asymptomatic.   He is seen today. Denies any chest pain or increased dyspnea or edema. Denies any palpitations or dizziness. He is very active traveling. Went to [place] this year and planning on a trip to [place] soon.    EKGs/Labs/Other Studies Reviewed:   The following studies were reviewed today  Cardiac PET 03/08/2022  Narrative &  Impression      Findings are consistent with prior myocardial infarction with peri-infarct ischemia. The study is moderate-high risk in the presence reduced LVEF and infarction size.   LV perfusion is abnormal. There is a perfusion defect in the mid & basal inferolateral and basal inferior that worsens with stress and is associated with hypokinesis. This is a moderate (3-6 segments) size and moderate (10-20% of LV myocardium) severity lesion. Consistent with infarction with small peri-infarct ischemia.   There is no evidence of transient ischemic dilation, TID 1.02   Rest left ventricular function is low normal (normal for modality). Rest EF: 44 %. Stress left ventricular function is  normal. Stress EF: 51 %. End diastolic cavity size is normal. End systolic cavity size is normal.   LVEF Reserve is normal at 7%   Myocardial blood flow reserve is not reported in this patient due to technical or patient-specific concerns that affect accuracy (Prior CABG)   Coronary calcium was present on the attenuation correction CT images. Severe coronary calcifications were present. Coronary calcifications were present in the left anterior descending artery, left circumflex artery and right coronary artery distribution(s). Aortic atherosclerosis noted.  Aortic valve calcification noted.   Electronically signed by Rudean Haskell MD     Echo 03/26/13:- Left ventricle: The cavity size was normal. Wall thickness   was normal. Systolic function was mildly reduced. The   estimated ejection fraction was in the range of 45% to   50%. There is hypokinesis of the basal-mid inferior   posterior myocardium. Doppler parameters are consistent   with abnormal left ventricular relaxation (grade 1   diastolic dysfunction). - Aortic valve: Mild regurgitation. - Mitral valve: Mild regurgitation. - Pulmonary arteries: PA peak pressure: 15m Hg (S).   Echo 09/01/20: IMPRESSIONS     1. Left ventricular ejection fraction, by estimation, is 45 to 50%. The  left ventricle has mildly decreased function. The left ventricle  demonstrates regional wall motion abnormalities (see scoring  diagram/findings for description). There is mild  concentric left ventricular hypertrophy. Left ventricular diastolic  parameters are consistent with Grade I diastolic dysfunction (impaired  relaxation). Elevated left atrial pressure.   2. Right ventricular systolic function is normal. The right ventricular  size is mildly enlarged. There is moderately elevated pulmonary artery  systolic pressure. The estimated right ventricular systolic pressure is  408.6mmHg.   3. Left atrial size was mildly dilated.   4. Right atrial  size was moderately dilated.   5. The mitral valve is normal in structure. Mild to moderate mitral valve  regurgitation. No evidence of mitral stenosis.   6. Tricuspid valve regurgitation is moderate.   7. The aortic valve is normal in structure. Aortic valve regurgitation is  mild. Mild to moderate aortic valve sclerosis/calcification is present,  without any evidence of aortic stenosis.   8. The inferior vena cava is normal in size with <50% respiratory  variability, suggesting right atrial pressure of 8 mmHg.    Event monitor 04/01/22: Study Highlights    Normal sinus rhythm. Occasional runs of SVT total 43 runs, longest lasting 13 beats. Frequent PACs 6.5% burden Frequent isolated PVCs burden 5.8%. No sustained tachy or bradyarrhythmias, AV block or pauses No Afib     Patch Wear Time:  7 days and 2 hours (2023-06-19T11:18:40-0400 to 2023-06-26T14:06:28-0400)   Patient had a min HR of 42 bpm, max HR of 176 bpm, and avg HR of 74 bpm. Predominant underlying rhythm was Sinus Rhythm. Bundle Branch Block/IVCD  was present. 43 Supraventricular Tachycardia runs occurred, the run with the fastest interval lasting 6  beats with a max rate of 176 bpm, the longest lasting 13 beats with an avg rate of 126 bpm. Some episodes of Supraventricular Tachycardia may be possible Atrial Tachycardia with variable block. Isolated SVEs were frequent (6.5%, [id number]), SVE Couplets were  rare (<1.0%, 3224), and SVE Triplets were rare (<1.0%, 380). Isolated VEs were frequent (5.8%, 43313), VE Couplets were rare (<1.0%, 2323), and no VE Triplets were present. Ventricular Bigeminy and Trigeminy were present.    EKG: No EKG today  Recent Labs: 03/28/2022: ALT 24; BUN 26; Creatinine, Ser 1.30; Hemoglobin 12.1; Platelets 274.0; Potassium 4.6; Sodium 134  Recent Lipid Panel    Component Value Date/Time   CHOL 178 03/23/2020 1333   TRIG 142 03/23/2020 1333   HDL 54 03/23/2020 1333   CHOLHDL 3.3 03/23/2020 1333    VLDL 28 03/23/2020 1333   LDLCALC 96 03/23/2020 1333   Dated 05/26/22: cholesterol 155, triglycerides 168, HDL 41, LDL 80. CMET and TSH normal.  Home Medications   Current Meds  Medication Sig   aspirin 81 MG tablet Take 81 mg by mouth daily.   atorvastatin (LIPITOR) 20 MG tablet Take 20 mg by mouth daily.   B Complex Vitamins (VITAMIN B COMPLEX PO) Take 1 tablet by mouth daily.   Cholecalciferol (VITAMIN D PO) Take 1,000 Units by mouth daily.   Ferrous Sulfate (IRON PO) Take by mouth daily.   gabapentin (NEURONTIN) 300 MG capsule Take 300 mg by mouth daily as needed.   HYDROcodone-acetaminophen (NORCO/VICODIN) 5-325 MG tablet Take 1 tablet by mouth every 6 (six) hours as needed for moderate pain.   isosorbide mononitrate (IMDUR) 30 MG 24 hr tablet Take 0.5 tablets (15 mg total) by mouth daily.   metoprolol succinate (TOPROL-XL) 25 MG 24 hr tablet Take 12.5 mg by mouth daily.   Misc Natural Products (GLUCOSAMINE CHOND COMPLEX/MSM) TABS take 1 po qd   Multiple Vitamins-Minerals (ZINC PO) Take by mouth daily.   nitroGLYCERIN (NITROSTAT) 0.4 MG SL tablet Place 1 tablet (0.4 mg total) under the tongue every 5 (five) minutes as needed for chest pain.   pregabalin (LYRICA) 75 MG capsule as needed.   SYNTHROID 75 MCG tablet Take 75 mcg by mouth daily before breakfast.      Review of Systems      All other systems reviewed and are otherwise negative except as noted above.  Physical Exam    VS:  BP (!) 141/66   Pulse 68   Ht '5\' 9"'$  (1.753 m)   Wt 157 lb 9.6 oz (71.5 kg)   SpO2 98%   BMI 23.27 kg/m  , BMI Body mass index is 23.27 kg/m.  Wt Readings from Last 3 Encounters:  06/27/22 157 lb 9.6 oz (71.5 kg)  03/28/22 156 lb 12.8 oz (71.1 kg)  03/21/22 157 lb (71.2 kg)    GEN: Well nourished, well developed, in no acute distress. HEENT: normal. Neck: Supple, no JVD, carotid bruits, or masses. Cardiac: RRR, no murmurs, rubs, or gallops. No clubbing, cyanosis, edema.  Radials/PT 2+ and  equal bilaterally.  Respiratory:  Respirations regular and unlabored, clear to auscultation bilaterally. GI: Soft, nontender, nondistended. MS: No deformity or atrophy. Skin: Warm and dry, no rash. Neuro:  Strength and sensation are intact. Psych: Normal affect.  Assessment & Plan    CAD -  Prior CABG 1995.  Cardiac PET 03/08/2022 consistent with prior MI and mild  peri-infarct ischemia with EF 44%.  He was started on Imdur 15 mg daily. Stable on low-dose Toprol 12.5 mg daily and will not further increase due to bifascicular block.  Currently no anginal symptoms. Continue atorvastatin, aspirin.  HLD. On lipitor. Last LDL 80  HTN - BP well controlled. Continue current antihypertensive regimen Toprol 12.'5mg'$  QD, Imdur 15 mg daily.    Mild LV dysfunction / HFrEF - Euvolemic and well compensated on exam.  Not requiring loop diuretic.  Continue Toprol 12.5 mg daily.  Continue low-salt diet, fluid restrict to less than 2 L.  GDMT limited by relative hypotension.  Bifasisular block /palpitations- no significant AV block, pauses or Afib on monitor. He does have frequent PVCs and PACs. Short runs of SVT. Given the fact he is asymptomatic will not pursue further therapy  6.   IPF - Follows with Dr. Vaughan Browner   Disposition: Follow up in 37month  Signed, Sharron Petruska JMartinique MD, [facility]9/25/2023, 2:41 PM [place]

## 2022-06-27 ENCOUNTER — Encounter: Payer: Self-pay | Admitting: Cardiology

## 2022-06-27 ENCOUNTER — Ambulatory Visit: Payer: Medicare Other | Attending: Cardiology | Admitting: Cardiology

## 2022-06-27 VITALS — BP 141/66 | HR 68 | Ht 69.0 in | Wt 157.6 lb

## 2022-06-27 DIAGNOSIS — R002 Palpitations: Secondary | ICD-10-CM

## 2022-06-27 DIAGNOSIS — I10 Essential (primary) hypertension: Secondary | ICD-10-CM | POA: Diagnosis not present

## 2022-06-27 DIAGNOSIS — I5022 Chronic systolic (congestive) heart failure: Secondary | ICD-10-CM

## 2022-06-27 DIAGNOSIS — I25118 Atherosclerotic heart disease of native coronary artery with other forms of angina pectoris: Secondary | ICD-10-CM | POA: Diagnosis not present

## 2022-06-29 ENCOUNTER — Encounter: Payer: Self-pay | Admitting: Pulmonary Disease

## 2022-06-29 ENCOUNTER — Ambulatory Visit: Payer: Medicare Other | Admitting: Pulmonary Disease

## 2022-06-29 VITALS — BP 116/64 | HR 82 | Temp 98.5°F | Ht 69.5 in | Wt 159.2 lb

## 2022-06-29 DIAGNOSIS — J84112 Idiopathic pulmonary fibrosis: Secondary | ICD-10-CM | POA: Diagnosis not present

## 2022-06-29 DIAGNOSIS — Z5181 Encounter for therapeutic drug level monitoring: Secondary | ICD-10-CM

## 2022-06-29 NOTE — Patient Instructions (Signed)
Since you cannot tolerate Esbriet We will start you on a medication called Ofev 100 mg twice daily I will check with Dr. Martinique about repeating the ultrasound of the heart Follow-up in 3 months.

## 2022-06-29 NOTE — Progress Notes (Signed)
Mitchell Fields    [id number]    06-Jan-1936  Primary Care Physician:Shaw, Emily Filbert., MD  Referring Physician: Ginger Organ., MD [address] [place],   [address]  Chief complaint:  Follow-up for interstitial lung disease, pulmonary fibrosis Did not tolerate Esbriet August 6242  HPI: 86 year old with history of monoclonal gammopathy, hypertension, hyperlipidemia, coronary artery disease Referred for evaluation of pulmonary fibrosis noted on recent CT chest  He follows with Dr. Alvy Bimler for monoclonal gammopathy and had a CT chest abdomen pelvis for evaluation of unexplained weight loss.  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  He is scheduled for a colonoscopy later this month for work-up of weight loss.   Chief complaint is fatigue, chronic dyspnea on exertion which is worsening gradually over the past few years. He had a consultation with Dr. Benjamine Mola, rheumatology for elevated CCP.  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  Later worked in [employer] Exposures: Exposure as noted above.  No mold, hot tub, Jacuzzi.  No feather pillows or comforters ILD questionnaire 01/01/2021-negative except as noted above Smoking history: 10-pack-year smoker.  Quit in 1969 Travel history: Previously lived in [place] and [place].  No significant recent travel Relevant family history: Dad had lung cancer.  He was a smoker.  Interim history:  Started Esbriet in April 2023.  He had issues with high blood pressure and fatigue with the medication. We gave a break from the medication and earlier this year but he had recurrence of symptoms.  Finally stopped taking it and end of August 2023  Currently feels well with no issues Planning a trip to [place] in October 2023  Outpatient Encounter Medications as of 06/29/2022  Medication Sig   aspirin 81 MG  tablet Take 81 mg by mouth daily.   atorvastatin (LIPITOR) 20 MG tablet Take 20 mg by mouth daily.   B Complex Vitamins (VITAMIN B COMPLEX PO) Take 1 tablet by mouth daily.   Cholecalciferol (VITAMIN D PO) Take 1,000 Units by mouth daily.   Ferrous Sulfate (IRON PO) Take by mouth daily.   gabapentin (NEURONTIN) 300 MG capsule Take 300 mg by mouth daily as needed.   HYDROcodone-acetaminophen (NORCO/VICODIN) 5-325 MG tablet Take 1 tablet by mouth every 6 (six) hours as needed for moderate pain.   isosorbide mononitrate (IMDUR) 30 MG 24 hr tablet Take 0.5 tablets (15 mg total) by mouth daily.   metoprolol succinate (TOPROL-XL) 25 MG 24 hr tablet Take 12.5 mg by mouth daily.   Misc Natural Products (GLUCOSAMINE CHOND COMPLEX/MSM) TABS take 1 po qd   Multiple Vitamins-Minerals (ZINC PO) Take by mouth daily.   pregabalin (LYRICA) 75 MG capsule as needed.   SYNTHROID 75 MCG tablet Take 75 mcg by mouth daily before breakfast.    nitroGLYCERIN (NITROSTAT) 0.4 MG SL tablet Place 1 tablet (0.4 mg total) under the tongue every 5 (five) minutes as needed for chest pain.   No facility-administered encounter medications on file as of 06/29/2022.    Physical Exam: Blood pressure 116/64, pulse 82, temperature 98.5 F (36.9 C), temperature source Oral, height 5' 9.5" (1.765 m), weight 159 lb 3.2 oz (72.2 kg), SpO2 94 %. Gen:      No acute distress HEENT:  EOMI, sclera anicteric Neck:     No masses; no thyromegaly Lungs:    Bibasal crackles CV:  Regular rate and rhythm; no murmurs Abd:      + bowel sounds; soft, non-tender; no palpable masses, no distension Ext:    No edema; adequate peripheral perfusion Skin:      Warm and dry; no rash Neuro: alert and oriented x 3 Psych: normal mood and affect   Data Reviewed: Imaging: CT chest 03/20/2015-emphysema, subpleural reticulation CT chest 08/21/2020-basilar predominant fibrotic ILD, probable UIP pattern.  Progressive since 2016.  Diverticulosis in  the abdomen, atherosclerosis. CT high-resolution 11/28/2021-ILD and probable UIP pattern.  Stable compared to 2020 I have reviewed the images personally.  PFTs: 01/01/2021 FVC 3.38 [89%], FEV1 2.43 [91%], F/F 72, TLC 6.07 [88%], DLCO 12.61 [53%] Moderate-severe diffusion defect  12/13/2021 FVC 3.23 (86%), FEV1 2.31 (88%), F/F 72, TLC 6.12 (89%), DLCO 12.07 (51%) Moderately severe Diffusion Defect Lung volumes are stable but diffusion capacity is slightly down compared to April 2022  Labs: CTD serologies 10/08/2020-significant for CCP 120. Rest of the labs are negative  Cardiac: Echocardiogram 09/01/2020 LVEF 45-50%, mild concentric LVH, grade 1 diastolic dysfunction, moderately elevated PA systolic pressure, RVSP 09.2  Assessment:  Pulmonary fibrosis Progressive in nature with probable UIP pattern.  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   We discussed alternate medications and started to initiate Ofev He had labs at his primary care last month which I reviewed in care everywhere.  Hepatic panel was normal  Pulmonary hypertension Noted on echocardiogram in 2021 I will discuss with Dr. Martin[place], cardiology about repeat echo  Plan/Recommendations: Start Ofev Discussed repeat echocardiogram  [name] MD [facility] 06/29/2022, 2:49 PM  CC: Ginger Organ., MD

## 2022-07-01 ENCOUNTER — Other Ambulatory Visit (HOSPITAL_COMMUNITY): Payer: Self-pay

## 2022-07-01 ENCOUNTER — Telehealth: Payer: Self-pay

## 2022-07-01 NOTE — Telephone Encounter (Signed)
ATC and LVM requesting call back. Direct phone number provided.

## 2022-07-01 NOTE — Telephone Encounter (Signed)
Spoke with pt and we will plan to proceed with submitting for PAP. Explained need to pt for income documents and he stated he will get the SS letters (for him and his wife) in the mail to the clinic. Informed him that I will reach back out to him once I have received the documents and have submitted the application.

## 2022-07-01 NOTE — Telephone Encounter (Signed)
Received New start paperwork for OFEV. Will update as we work through the benefits process.  Submitted a Prior Authorization request to [facility] for OFEV via CoverMyMeds. Authorization has been CANCELLED due to member already having access to the requested medication.      Test Claim revealed that a 30 day supply has a copay of $125.00.   Patient can fill through [place]: [phone]     CMM KEY: Key: [id number]   Will reach out to pt to determine if copay is acceptable or if we will need to submit for PAP.

## 2022-07-08 ENCOUNTER — Other Ambulatory Visit (HOSPITAL_COMMUNITY): Payer: Self-pay

## 2022-07-08 NOTE — Telephone Encounter (Signed)
Submitted Patient Assistance Application to [facility] for [place] along with provider portion, PA and income documents. Reached out to pt and LVM providing update as well as [employer] phone number. Stated that I would reach out again once I had heard any updates.  Fax# [phone] Phone# [phone]

## 2022-07-11 NOTE — Telephone Encounter (Signed)
Received a fax from  [facility] regarding an approval for [place] patient assistance from 07/08/2022 to 10/02/2022. Approval letter sent to scan center.  Reached out and spoke with pt, he informs me that he has not spoken with anyone regarding approval. Provided him with phone number and general instructions on how to f/u. Pt states that he will be leaving to go out of the country for 19 days, advised that he could probably discuss that with [facility] Cares rep and work out an arrangement to have the medication delivered at a later date.  Also informed him that [facility] would be reaching out to go over clinical information regarding the medication and he stated that he was curious if the statin medications he was on would have any kind of interaction with this medication (specifically would it make him drowsy).  [place] Phone number: [phone]

## 2022-07-13 ENCOUNTER — Telehealth: Payer: Self-pay

## 2022-07-13 DIAGNOSIS — J84112 Idiopathic pulmonary fibrosis: Secondary | ICD-10-CM

## 2022-07-13 DIAGNOSIS — Z5181 Encounter for therapeutic drug level monitoring: Secondary | ICD-10-CM

## 2022-07-13 NOTE — Telephone Encounter (Signed)
Attempted to provide counseling in separate telephone encounter. Unable to reach. Will f/u in 3 weeks since he is going out of country.  Knox Saliva, PharmD, MPH, BCPS, CPP Clinical Pharmacist (Rheumatology and Pulmonology)

## 2022-07-13 NOTE — Telephone Encounter (Signed)
LVM for OFEV counseling. We will try back in a few weeks as he likely is out of the country at this time.   Maryan Puls, PharmD PGY-1 [facility] Resident

## 2022-08-03 NOTE — Telephone Encounter (Signed)
LVM requesting a call back regarding OFEV counseling. Will follow up later as he may still be out of the country at this time.   Rodolph Bong, PharmD Candidate 08/03/2022 3:09 PM

## 2022-08-09 MED ORDER — OFEV 100 MG PO CAPS: 100.0000 mg | ORAL_CAPSULE | Freq: Two times a day (BID) | ORAL | 1 refills | Status: DC

## 2022-08-09 NOTE — Telephone Encounter (Signed)
ATC patient to discuss Ofev. Left VM.  Knox Saliva, PharmD, MPH, BCPS, CPP Clinical Pharmacist (Rheumatology and Pulmonology)

## 2022-08-09 NOTE — Telephone Encounter (Signed)
Subjective:  Patient called today by [facility] pharmacy team for Ofev new start counseling for IPF. Patient was last seen by Dr. Vaughan Browner on 06/29/2022. Pertinent past medical history includes CAD, HTN, diverticulosis of colon, CKD, HLD.   Reports baseline constipation and is not concerned about side effects at this time  He did try Esbriet previously for his IPF. Started in April 2023 but had significant fatigue  History of CAD: Yes History of MI: No Current anticoagulant use: No History of HTN: Yes  History of elevated LFTs: No History of diarrhea, nausea, vomiting: No  Objective: Allergies  Allergen Reactions   Cortisone Other (See Comments)    REACTION: shut adrenal glands down if given too much    Outpatient Encounter Medications as of 07/13/2022  Medication Sig   aspirin 81 MG tablet Take 81 mg by mouth daily.   atorvastatin (LIPITOR) 20 MG tablet Take 20 mg by mouth daily.   B Complex Vitamins (VITAMIN B COMPLEX PO) Take 1 tablet by mouth daily.   Cholecalciferol (VITAMIN D PO) Take 1,000 Units by mouth daily.   Ferrous Sulfate (IRON PO) Take by mouth daily.   gabapentin (NEURONTIN) 300 MG capsule Take 300 mg by mouth daily as needed.   HYDROcodone-acetaminophen (NORCO/VICODIN) 5-325 MG tablet Take 1 tablet by mouth every 6 (six) hours as needed for moderate pain.   isosorbide mononitrate (IMDUR) 30 MG 24 hr tablet Take 0.5 tablets (15 mg total) by mouth daily.   metoprolol succinate (TOPROL-XL) 25 MG 24 hr tablet Take 12.5 mg by mouth daily.   Misc Natural Products (GLUCOSAMINE CHOND COMPLEX/MSM) TABS take 1 po qd   Multiple Vitamins-Minerals (ZINC PO) Take by mouth daily.   nitroGLYCERIN (NITROSTAT) 0.4 MG SL tablet Place 1 tablet (0.4 mg total) under the tongue every 5 (five) minutes as needed for chest pain.   pregabalin (LYRICA) 75 MG capsule as needed.   SYNTHROID 75 MCG tablet Take 75 mcg by mouth daily before breakfast.    No facility-administered  encounter medications on file as of 07/13/2022.     Immunization History  Administered Date(s) Administered   Fluad Quad(high Dose 65+) 06/15/2019   Influenza Split 11/13/2009, 02/18/2010, 07/07/2010, 05/30/2011, 07/03/2012, 06/06/2013, 07/03/2014, 06/05/2020, 07/05/2021   Influenza, High Dose Seasonal PF 07/05/2016, 05/24/2017, 06/23/2018, 06/15/2019, 06/03/2020   Influenza,inj,Quad PF,6+ Mos 07/20/2013   Influenza-Unspecified 06/23/2018   Moderna Sars-Covid-2 Vaccination 10/17/2019, 11/15/2019, 07/29/2020, 01/26/2021, 08/03/2021   Pneumococcal Conjugate-13 03/06/2014   Pneumococcal Polysaccharide-23 11/13/2009, 02/18/2010   Tetanus 05/20/2013   Zoster, Live 11/13/2009, 02/18/2010, 06/13/2017, 09/05/2017    PFT's TLC  Date Value Ref Range Status  12/13/2021 6.12 L Final    CMP     Component Value Date/Time   NA 134 (L) 03/28/2022 1555   NA 135 (L) 03/23/2017 1316   K 4.6 03/28/2022 1555   K 4.5 03/23/2017 1316   CL 97 03/28/2022 1555   CL 94 (L) 03/20/2013 1510   CO2 29 03/28/2022 1555   CO2 26 03/23/2017 1316   GLUCOSE 111 (H) 03/28/2022 1555   GLUCOSE 103 03/23/2017 1316   GLUCOSE 122 (H) 03/20/2013 1510   BUN 26 (H) 03/28/2022 1555   BUN 17.8 03/23/2017 1316   CREATININE 1.30 03/28/2022 1555   CREATININE 1.3 03/23/2017 1316   CALCIUM 9.2 03/28/2022 1555   CALCIUM 9.6 03/23/2017 1316   PROT 6.7 03/28/2022 1555   PROT 6.5 03/23/2017 1317   PROT 6.8 03/23/2017 1316   ALBUMIN 4.2 03/28/2022 1555  ALBUMIN 3.8 03/23/2017 1316   AST 23 03/28/2022 1555   AST 29 03/23/2017 1316   ALT 24 03/28/2022 1555   ALT 28 03/23/2017 1316   ALKPHOS 56 03/28/2022 1555   ALKPHOS 59 03/23/2017 1316   BILITOT 0.4 03/28/2022 1555   BILITOT 1.00 03/23/2017 1316   GFRNONAA >60 10/19/2021 0912   GFRAA >60 03/23/2020 1333    CBC    Component Value Date/Time   WBC 7.5 03/28/2022 1555   RBC 3.67 (L) 03/28/2022 1555   HGB 12.1 (L) 03/28/2022 1555   HGB 12.0 (L) 03/23/2017 1317    HCT 33.6 (L) 03/28/2022 1555   HCT 34.0 (L) 03/23/2017 1317   PLT 274.0 03/28/2022 1555   PLT 213 03/23/2017 1317   MCV 91.4 03/28/2022 1555   MCV 95.2 03/23/2017 1317   MCH 31.8 10/19/2021 0912   MCHC 36.0 03/28/2022 1555   RDW 13.4 03/28/2022 1555   RDW 12.6 03/23/2017 1317   LYMPHSABS 2.1 03/28/2022 1555   LYMPHSABS 1.8 03/23/2017 1317   MONOABS 0.8 03/28/2022 1555   MONOABS 0.7 03/23/2017 1317   EOSABS 0.2 03/28/2022 1555   EOSABS 0.1 03/23/2017 1317   BASOSABS 0.0 03/28/2022 1555   BASOSABS 0.0 03/23/2017 1317    LFT's    Latest Ref Rng & Units 03/28/2022    3:55 PM 10/19/2021    9:12 AM 03/25/2021    1:29 PM  Hepatic Function  Total Protein 6.0 - 8.3 g/dL 6.7  6.8  7.8   Albumin 3.5 - 5.2 g/dL 4.2  3.9  4.2   AST 0 - 37 U/L _0 ALT 0 - 53 U/L _1 Alk Phosphatase 39 - 117 U/L 56  64  53   Total Bilirubin 0.2 - 1.2 mg/dL 0.4  0.4  0.6     HRCT (11/28/2021) - appearance of the lungs is compatible with interstitial lung disease, once again categorized as probable usual interstitial pneumonia (UIP) per current ATS guidelines. No progression compared to the prior study from 2021.  Assessment and Plan  Ofev Medication Management Thoroughly counseled patient on the efficacy, mechanism of action, dosing, administration, adverse effects, and monitoring parameters of Ofev. Patient verbalized understanding.  Goals of Therapy: Will not stop or reverse the progression of ILD. It will slow the progression of ILD.  Inhibits tyrosine kinase inhibitors which slow the fibrosis/progression of ILD -Significant reduction in the rate of disease progression was observed after treatment (61.1% [before] vs 33.3% [after], P?=?0.008) over 42 weeks.  Dosing: 100 mg (one capsule) by mouth twice daily (approx 12 hours apart). Discussed taking with food approximately 12 hours apart. Discussed that capsule should not be crushed or split.  Adverse Effects: Nausea, vomiting,  diarrhea (2 in 3 patients) appetite loss, weight loss - management of diarrhea with loperamide discussed including max use of 48 hours and max of 8 capsules per day. Abdominal pain (up to 1 in 5 patients) Nasopharyngitis (13%), UTI (6%) Risk of thrombosis (3%) and acute MI (2%) Hypertension (5%) Dizziness Fatigue (10%)  Monitoring: Monitor for diarrhea, nausea and vomiting, GI perforation, hepatotoxicity  Monitor LFTs - baseline, monthly for first 6 months, then every 3 months routinely CBC w differential at baseline and every 3 months routinely  Access: Approval of Ofev through: patient assistance Rx sent to: [facility] for Ofev: [phone] Requested he ask [facility] for renewal application for [phone] since they are already taking renewals  Medication  Reconciliation A drug regimen assessment was performed, including review of allergies, interactions, disease-state management, dosing and immunization history. Medications were reviewed with the patient, including name, instructions, indication, goals of therapy, potential side effects, importance of adherence, and safe use.  Anticoagulant use: No  This appointment required 30 minutes of patient care (this includes precharting, chart review, review of results, face-to-face care, etc.).  Thank you for involving pharmacy to assist in providing this patient's care.    Knox Saliva, PharmD, MPH, BCPS, CPP Clinical Pharmacist (Rheumatology and Pulmonology)

## 2022-08-18 ENCOUNTER — Ambulatory Visit (INDEPENDENT_AMBULATORY_CARE_PROVIDER_SITE_OTHER): Payer: Medicare Other

## 2022-08-18 ENCOUNTER — Ambulatory Visit: Payer: Medicare Other | Admitting: Orthopaedic Surgery

## 2022-08-18 ENCOUNTER — Encounter: Payer: Self-pay | Admitting: Orthopaedic Surgery

## 2022-08-18 DIAGNOSIS — M5442 Lumbago with sciatica, left side: Secondary | ICD-10-CM | POA: Diagnosis not present

## 2022-08-18 DIAGNOSIS — M25552 Pain in left hip: Secondary | ICD-10-CM

## 2022-08-18 DIAGNOSIS — G8929 Other chronic pain: Secondary | ICD-10-CM | POA: Diagnosis not present

## 2022-08-18 MED ORDER — HYDROCODONE-ACETAMINOPHEN 5-325 MG PO TABS: 1.0000 | ORAL_TABLET | Freq: Two times a day (BID) | ORAL | 0 refills | Status: DC | PRN

## 2022-08-18 NOTE — Progress Notes (Signed)
The patient comes in today with left-sided hip pain.  However he points to the sciatic area as a source of his pain where he is point tender.  He is 86 years old.  We have provided steroid injections over each hip trochanteric area in the past.  He is requesting a few hydrocodone tablets as well.  He denies any acute change in medical status.  He has had 3 previous lumbar spine surgeries.  On exam his left hip moves smoothly and fluidly.  There is no pain over the trochanteric area.  His pain seems to be more on the ischium but also between the ischium and the trochanter around the sciatic nerve area.  I told him this is not a place that usually placed an injection in terms of anatomic areas.  I do not mind trying a steroid injection more superficially in the soft tissue but I would not put one down deep around the nerve.  I explained why this is the case.  An x-ray of his pelvis and left hip shows no acute findings.  There is a slight cortical irregularity distal to the ischium suggesting an old hamstring injury but is not really painful over the ischium and there is no weakness in his hamstrings.  I did place a steroid injection without difficulty and will send a one-time prescription for hydrocodone.  Follow-up is as needed.  My next step would be potentially sending him to a spine specialist to see if there is a radicular component of this pain.

## 2022-08-22 ENCOUNTER — Ambulatory Visit: Payer: Medicare Other | Admitting: Orthopaedic Surgery

## 2022-08-30 ENCOUNTER — Telehealth: Payer: Self-pay | Admitting: Pulmonary Disease

## 2022-08-30 NOTE — Telephone Encounter (Signed)
Called and spoke with patient.  Front desk had contacted patient to schedule OV from recall.  Patient scheduled 10/12/22 at 1515 with Dr. Vaughan Browner for follow up.  Patient stated he started Ofev and took it only 5 days. Patient stated he lost 10lbs with Ofev, but the was his only side effect.  Patient stated he stopped Ofev before Thanksgiving, but plans to resume Ofev once he gains his 10lbs back.    Message routed to Dr. Vaughan Browner as Juluis Rainier

## 2022-08-30 NOTE — Telephone Encounter (Signed)
Patient stated he is returning a call.  He said he was not sure why the office was calling but would like a call back.  CB# [phone]

## 2022-09-01 ENCOUNTER — Telehealth: Payer: Self-pay | Admitting: Orthopaedic Surgery

## 2022-09-01 DIAGNOSIS — G8929 Other chronic pain: Secondary | ICD-10-CM

## 2022-09-01 DIAGNOSIS — M25552 Pain in left hip: Secondary | ICD-10-CM

## 2022-09-01 NOTE — Telephone Encounter (Signed)
Ordered. Pt was called and advised and stated understanding

## 2022-09-01 NOTE — Telephone Encounter (Signed)
Pt called and would like a mri on his left hip and said the cortisone shot made pain worse.   CB [phone]

## 2022-09-01 NOTE — Addendum Note (Signed)
Addended by: Robyne Peers on: 09/01/2022 03:38 PM   Modules accepted: Orders

## 2022-09-07 ENCOUNTER — Telehealth: Payer: Self-pay | Admitting: Orthopaedic Surgery

## 2022-09-09 ENCOUNTER — Telehealth: Payer: Self-pay | Admitting: Orthopaedic Surgery

## 2022-09-09 NOTE — Telephone Encounter (Signed)
Patient has question about his back..[phone]

## 2022-09-09 NOTE — Telephone Encounter (Signed)
LMOM for patient that I was returning his call and if he stlil has questions to call back

## 2022-10-01 ENCOUNTER — Ambulatory Visit
Admission: RE | Admit: 2022-10-01 | Discharge: 2022-10-01 | Disposition: A | Discharge status: Home / Self Care | Payer: Medicare Other | Source: Ambulatory Visit | Attending: Orthopaedic Surgery | Admitting: Orthopaedic Surgery

## 2022-10-01 DIAGNOSIS — M25552 Pain in left hip: Secondary | ICD-10-CM

## 2022-10-01 DIAGNOSIS — G8929 Other chronic pain: Secondary | ICD-10-CM

## 2022-10-12 ENCOUNTER — Ambulatory Visit: Payer: Medicare Other | Admitting: Pulmonary Disease

## 2022-10-12 ENCOUNTER — Encounter: Payer: Self-pay | Admitting: Pulmonary Disease

## 2022-10-12 VITALS — BP 134/64 | HR 76 | Temp 97.7°F | Ht 70.0 in | Wt 150.2 lb

## 2022-10-12 DIAGNOSIS — J84112 Idiopathic pulmonary fibrosis: Secondary | ICD-10-CM

## 2022-10-12 DIAGNOSIS — Z5181 Encounter for therapeutic drug level monitoring: Secondary | ICD-10-CM | POA: Diagnosis not present

## 2022-10-12 NOTE — Progress Notes (Unsigned)
Mitchell Fields    [id number]    1936/04/11  Primary Care Physician:Shaw, Emily Filbert., MD  Referring Physician: Ginger Organ., MD [address] [place],  [place] [address]  Chief complaint:  Follow-up for interstitial lung disease, pulmonary fibrosis Did not tolerate Esbriet August 4737  HPI: 87 year old with history of monoclonal gammopathy, hypertension, hyperlipidemia, coronary artery disease Referred for evaluation of pulmonary fibrosis noted on recent CT chest  He follows with Dr. Alvy Bimler for monoclonal gammopathy and had a CT chest abdomen pelvis for evaluation of unexplained weight loss.  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  He is scheduled for a colonoscopy later this month for work-up of weight loss.   Chief complaint is fatigue, chronic dyspnea on exertion which is worsening gradually over the past few years. He had a consultation with Dr. Benjamine Mola, rheumatology for elevated CCP.  No evidence of rheumatoid arthritis noted  Started Esbriet in April 2023.  He had issues with high blood pressure and fatigue with the medication. We gave a break from the medication and earlier this year but he had recurrence of symptoms.  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  Later worked in [employer] Exposures: Exposure as noted above.  No mold, hot tub, Jacuzzi.  No feather pillows or comforters ILD questionnaire 01/01/2021-negative except as noted above Smoking history: 10-pack-year smoker.  Quit in 1969 Travel history: Previously lived in [place] and [place].  No significant recent travel Relevant family history: Dad had lung cancer.  He was a smoker.  Interim history:  Started on Ofev in October 2023.  This is also poorly tolerated with fatigue, weight loss and he stopped taking it States that dyspnea slightly worse  Outpatient  Encounter Medications as of 10/12/2022  Medication Sig   aspirin 81 MG tablet Take 81 mg by mouth daily.   atorvastatin (LIPITOR) 20 MG tablet Take 20 mg by mouth daily.   B Complex Vitamins (VITAMIN B COMPLEX PO) Take 1 tablet by mouth daily.   Cholecalciferol (VITAMIN D PO) Take 1,000 Units by mouth daily.   Ferrous Sulfate (IRON PO) Take by mouth daily.   gabapentin (NEURONTIN) 300 MG capsule Take 300 mg by mouth daily as needed.   HYDROcodone-acetaminophen (NORCO/VICODIN) 5-325 MG tablet Take 1 tablet by mouth 2 (two) times daily as needed for moderate pain.   isosorbide mononitrate (IMDUR) 30 MG 24 hr tablet Take 0.5 tablets (15 mg total) by mouth daily.   metoprolol succinate (TOPROL-XL) 25 MG 24 hr tablet Take 12.5 mg by mouth daily.   Misc Natural Products (GLUCOSAMINE CHOND COMPLEX/MSM) TABS take 1 po qd   Multiple Vitamins-Minerals (ZINC PO) Take by mouth daily.   pregabalin (LYRICA) 75 MG capsule as needed.   SYNTHROID 75 MCG tablet Take 75 mcg by mouth daily before breakfast.    Nintedanib (OFEV) 100 MG CAPS Take 1 capsule (100 mg total) by mouth 2 (two) times daily. (Patient not taking: Reported on 10/12/2022)   nitroGLYCERIN (NITROSTAT) 0.4 MG SL tablet Place 1 tablet (0.4 mg total) under the tongue every 5 (five) minutes as needed for chest pain.   No facility-administered encounter medications on file as of 10/12/2022.    Physical Exam: Blood pressure 134/64, pulse 76, temperature 97.7 F (36.5 C), temperature source Oral, height '5\' 10"'$  (1.778 m), weight 150 lb  3.2 oz (68.1 kg), SpO2 98 %. Gen:      No acute distress HEENT:  EOMI, sclera anicteric Neck:     No masses; no thyromegaly Lungs:    Bibasal crackles CV:         Regular rate and rhythm; no murmurs Abd:      + bowel sounds; soft, non-tender; no palpable masses, no distension Ext:    No edema; adequate peripheral perfusion Skin:      Warm and dry; no rash Neuro: alert and oriented x 3 Psych: normal mood and affect    Data Reviewed: Imaging: CT chest 03/20/2015-emphysema, subpleural reticulation CT chest 08/21/2020-basilar predominant fibrotic ILD, probable UIP pattern.  Progressive since 2016.  Diverticulosis in the abdomen, atherosclerosis. CT high-resolution 11/28/2021-ILD and probable UIP pattern.  Stable compared to 2020 I have reviewed the images personally.  PFTs: 01/01/2021 FVC 3.38 [89%], FEV1 2.43 [91%], F/F 72, TLC 6.07 [88%], DLCO 12.61 [53%] Moderate-severe diffusion defect  12/13/2021 FVC 3.23 (86%), FEV1 2.31 (88%), F/F 72, TLC 6.12 (89%), DLCO 12.07 (51%) Moderately severe Diffusion Defect Lung volumes are stable but diffusion capacity is slightly down compared to April 2022  Labs: CTD serologies 10/08/2020-significant for CCP 120. Rest of the labs are negative  Cardiac: Echocardiogram 09/01/2020 LVEF 45-50%, mild concentric LVH, grade 1 diastolic dysfunction, moderately elevated PA systolic pressure, RVSP 30.0  Assessment:  Pulmonary fibrosis Progressive in nature with probable UIP pattern.  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   Ofev was also poorly tolerated due to fatigue and weight loss  We had a discussion today about trying a low-dose but he would like to hold off on any changes for now and would like to be off antifibrotic's will concentrate on regaining weight.  Reassess in 3 months  Pulmonary hypertension Noted on echocardiogram in 2021 I will discuss with Dr. Martinique, cardiology about repeat echo  Plan/Recommendations: Hold antifibrotic's.  Reassess in 3 months.  [name] MD [facility] 10/12/2022, 3:37 PM  CC: Ginger Organ., MD

## 2022-10-12 NOTE — Patient Instructions (Signed)
Will hold off the Ofev as it is causing issues and side effects Follow-up in 3 months for reevaluation.

## 2022-10-13 ENCOUNTER — Encounter: Payer: Self-pay | Admitting: Pulmonary Disease

## 2022-10-21 ENCOUNTER — Other Ambulatory Visit: Payer: Self-pay | Admitting: Hematology and Oncology

## 2022-10-21 ENCOUNTER — Inpatient Hospital Stay: Payer: Medicare Other | Attending: Hematology and Oncology

## 2022-10-21 DIAGNOSIS — N183 Chronic kidney disease, stage 3 unspecified: Secondary | ICD-10-CM | POA: Diagnosis not present

## 2022-10-21 DIAGNOSIS — R7989 Other specified abnormal findings of blood chemistry: Secondary | ICD-10-CM | POA: Insufficient documentation

## 2022-10-21 DIAGNOSIS — D649 Anemia, unspecified: Secondary | ICD-10-CM | POA: Insufficient documentation

## 2022-10-21 DIAGNOSIS — D472 Monoclonal gammopathy: Secondary | ICD-10-CM | POA: Insufficient documentation

## 2022-10-21 LAB — CBC WITH DIFFERENTIAL/PLATELET
Abs Immature Granulocytes: 0.03 10*3/uL (ref 0.00–0.07)
Basophils Absolute: 0.1 10*3/uL (ref 0.0–0.1)
Basophils Relative: 1 %
Eosinophils Absolute: 0.3 10*3/uL (ref 0.0–0.5)
Eosinophils Relative: 3 %
HCT: 36.8 % — ABNORMAL LOW (ref 39.0–52.0)
Hemoglobin: 12.8 g/dL — ABNORMAL LOW (ref 13.0–17.0)
Immature Granulocytes: 0 %
Lymphocytes Relative: 23 %
Lymphs Abs: 2.3 10*3/uL (ref 0.7–4.0)
MCH: 31.6 pg (ref 26.0–34.0)
MCHC: 34.8 g/dL (ref 30.0–36.0)
MCV: 90.9 fL (ref 80.0–100.0)
Monocytes Absolute: 1 10*3/uL (ref 0.1–1.0)
Monocytes Relative: 10 %
Neutro Abs: 6.4 10*3/uL (ref 1.7–7.7)
Neutrophils Relative %: 63 %
Platelets: 287 10*3/uL (ref 150–400)
RBC: 4.05 MIL/uL — ABNORMAL LOW (ref 4.22–5.81)
RDW: 13.3 % (ref 11.5–15.5)
WBC: 10.1 10*3/uL (ref 4.0–10.5)
nRBC: 0 % (ref 0.0–0.2)

## 2022-10-21 LAB — CMP (CANCER CENTER ONLY)
ALT: 25 U/L (ref 0–44)
AST: 21 U/L (ref 15–41)
Albumin: 3.9 g/dL (ref 3.5–5.0)
Alkaline Phosphatase: 49 U/L (ref 38–126)
Anion gap: 7 (ref 5–15)
BUN: 17 mg/dL (ref 8–23)
CO2: 29 mmol/L (ref 22–32)
Calcium: 9.3 mg/dL (ref 8.9–10.3)
Chloride: 97 mmol/L — ABNORMAL LOW (ref 98–111)
Creatinine: 1.11 mg/dL (ref 0.61–1.24)
GFR, Estimated: >60 mL/min (ref >60)
Glucose, Bld: 85 mg/dL (ref 70–99)
Potassium: 4.5 mmol/L (ref 3.5–5.1)
Sodium: 133 mmol/L — ABNORMAL LOW (ref 135–145)
Total Bilirubin: 0.7 mg/dL (ref 0.3–1.2)
Total Protein: 6.5 g/dL (ref 6.5–8.1)

## 2022-10-24 LAB — KAPPA/LAMBDA LIGHT CHAINS
Kappa free light chain: 55.6 mg/L — ABNORMAL HIGH (ref 3.3–19.4)
Kappa, lambda light chain ratio: 3.09 — ABNORMAL HIGH (ref 0.26–1.65)
Lambda free light chains: 18 mg/L (ref 5.7–26.3)

## 2022-10-26 LAB — MULTIPLE MYELOMA PANEL, SERUM
Albumin SerPl Elph-Mcnc: 3.7 g/dL (ref 2.9–4.4)
Albumin/Glob SerPl: 1.2 (ref 0.7–1.7)
Alpha 1: 0.3 g/dL (ref 0.0–0.4)
Alpha2 Glob SerPl Elph-Mcnc: 0.8 g/dL (ref 0.4–1.0)
B-Globulin SerPl Elph-Mcnc: 0.9 g/dL (ref 0.7–1.3)
Gamma Glob SerPl Elph-Mcnc: 1.2 g/dL (ref 0.4–1.8)
Globulin, Total: 3.1 g/dL (ref 2.2–3.9)
IgA: 244 mg/dL (ref 61–437)
IgG (Immunoglobin G), Serum: 1125 mg/dL (ref 603–1613)
IgM (Immunoglobulin M), Srm: 12 mg/dL — ABNORMAL LOW (ref 15–143)
M Protein SerPl Elph-Mcnc: 0.5 g/dL — ABNORMAL HIGH
Total Protein ELP: 6.8 g/dL (ref 6.0–8.5)

## 2022-10-28 ENCOUNTER — Inpatient Hospital Stay: Payer: Medicare Other | Admitting: Hematology and Oncology

## 2022-10-28 VITALS — BP 121/53 | HR 72 | Temp 98.5°F | Resp 18 | Ht 70.0 in | Wt 150.0 lb

## 2022-10-28 DIAGNOSIS — D472 Monoclonal gammopathy: Secondary | ICD-10-CM

## 2022-10-28 DIAGNOSIS — N183 Chronic kidney disease, stage 3 unspecified: Secondary | ICD-10-CM

## 2022-10-28 DIAGNOSIS — D638 Anemia in other chronic diseases classified elsewhere: Secondary | ICD-10-CM

## 2022-10-29 ENCOUNTER — Encounter: Payer: Self-pay | Admitting: Hematology and Oncology

## 2022-10-29 NOTE — Progress Notes (Signed)
[facility] OFFICE PROGRESS NOTE  Patient Care Team: Ginger Organ., MD as PCP - General (Internal Medicine) Martinique, Peter M, MD as PCP - Cardiology (Cardiology) Inda Castle, MD (Inactive) as Attending Physician (Gastroenterology) Martinique, Peter M, MD as Attending Physician (Cardiology) Jarome Matin, MD as Consulting Physician (Dermatology) System, Provider Not In Heath Lark, MD as Consulting Physician (Hematology and Oncology)  ASSESSMENT & PLAN:  Monoclonal gammopathy His myeloma panel is stable with no significant changes I will see him again in a year for further follow-up with repeat myeloma panel to be done a week ahead of time  CKD (chronic kidney disease), stage III ([place]) He has elevated serum creatinine, consistent with chronic kidney disease stage III I recommend the patient to increase oral fluid intake and to aim approximately 80 ounces of water per day His elevated creatinine is not due to his MGUS  Anemia due to chronic illness This is likely related to chronic kidney disease stage III Observe closely for now  Orders Placed This Encounter  Procedures   Comprehensive metabolic panel    Standing Status:   Standing    Number of Occurrences:   33    Standing Expiration Date:   10/30/2023    All questions were answered. The patient knows to call the clinic with any problems, questions or concerns. The total time spent in the appointment was 20 minutes encounter with patients including review of chart and various tests results, discussions about plan of care and coordination of care plan   Heath Lark, MD 10/29/2022 9:28 AM  INTERVAL HISTORY: Please see below for problem oriented charting. he returns for follow-up for monoclonal gammopathy of unknown significance The patient was started on treatment for pulmonary fibrosis but tolerated that poorly and has lost some weight He felt better when he stopped taking it He denies cough, chest pain or  shortness of breath From the MGUS standpoint, he denies bone pain or recent infection We spent majority of time reviewing test results  REVIEW OF SYSTEMS:   Constitutional: Denies fevers, chills or abnormal weight loss Eyes: Denies blurriness of vision Ears, nose, mouth, throat, and face: Denies mucositis or sore throat Respiratory: Denies cough, dyspnea or wheezes Cardiovascular: Denies palpitation, chest discomfort or lower extremity swelling Gastrointestinal:  Denies nausea, heartburn or change in bowel habits Skin: Denies abnormal skin rashes Lymphatics: Denies new lymphadenopathy or easy bruising Neurological:Denies numbness, tingling or new weaknesses Behavioral/Psych: Mood is stable, no new changes  All other systems were reviewed with the patient and are negative.  I have reviewed the past medical history, past surgical history, social history and family history with the patient and they are unchanged from previous note.  ALLERGIES:  is allergic to cortisone.  MEDICATIONS:  Current Outpatient Medications  Medication Sig Dispense Refill   aspirin 81 MG tablet Take 81 mg by mouth daily.     atorvastatin (LIPITOR) 20 MG tablet Take 20 mg by mouth daily.     B Complex Vitamins (VITAMIN B COMPLEX PO) Take 1 tablet by mouth daily.     Cholecalciferol (VITAMIN D PO) Take 1,000 Units by mouth daily.     Ferrous Sulfate (IRON PO) Take by mouth daily.     gabapentin (NEURONTIN) 300 MG capsule Take 300 mg by mouth daily as needed.     HYDROcodone-acetaminophen (NORCO/VICODIN) 5-325 MG tablet Take 1 tablet by mouth 2 (two) times daily as needed for moderate pain. 30 tablet 0   isosorbide mononitrate (  IMDUR) 30 MG 24 hr tablet Take 0.5 tablets (15 mg total) by mouth daily. 45 tablet 3   metoprolol succinate (TOPROL-XL) 25 MG 24 hr tablet Take 12.5 mg by mouth daily.     Misc Natural Products (GLUCOSAMINE CHOND COMPLEX/MSM) TABS take 1 po qd     Multiple Vitamins-Minerals (ZINC PO) Take  by mouth daily.     Nintedanib (OFEV) 100 MG CAPS Take 1 capsule (100 mg total) by mouth 2 (two) times daily. (Patient not taking: Reported on 10/12/2022) 180 capsule 1   nitroGLYCERIN (NITROSTAT) 0.4 MG SL tablet Place 1 tablet (0.4 mg total) under the tongue every 5 (five) minutes as needed for chest pain. 90 tablet 3   pregabalin (LYRICA) 75 MG capsule as needed.     SYNTHROID 75 MCG tablet Take 75 mcg by mouth daily before breakfast.      No current facility-administered medications for this visit.    SUMMARY OF ONCOLOGIC HISTORY:  This is a pleasant gentleman who was found to have anemia and abnormal M spike. He had further workup including blood work, urine test, skeletal survey as well as bone marrow aspirate and biopsy. 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 He also has abnormal lung nodule, observed with serial imaging CT scan. Last CT scan of the chest dated June 2016, confirmed benign lung nodules with no further follow-up recommended Due to abnormal weight loss, he underwent CT imaging of the chest, abdomen and pelvis on August 21, 2020 which showed progression of pulmonary fibrosis and diverticulosis of the colon  PHYSICAL EXAMINATION: ECOG PERFORMANCE STATUS: 0 - Asymptomatic  Vitals:   10/28/22 1216  BP: (!) 121/53  Pulse: 72  Resp: 18  Temp: 98.5 F (36.9 C)  SpO2: 97%   Filed Weights   10/28/22 1216  Weight: 150 lb (68 kg)    GENERAL:alert, no distress and comfortable  NEURO: alert & oriented x 3 with fluent speech, no focal motor/sensory deficits  LABORATORY DATA:  I have reviewed the data as listed    Component Value Date/Time   NA 133 (L) 10/21/2022 1305   NA 135 (L) 03/23/2017 1316   K 4.5 10/21/2022 1305   K 4.5 03/23/2017 1316   CL 97 (L) 10/21/2022 1305   CL 94 (L) 03/20/2013 1510   CO2 29 10/21/2022 1305   CO2 26 03/23/2017 1316   GLUCOSE 85 10/21/2022 1305   GLUCOSE 103 03/23/2017 1316   GLUCOSE 122 (H) 03/20/2013 1510   BUN 17  10/21/2022 1305   BUN 17.8 03/23/2017 1316   CREATININE 1.11 10/21/2022 1305   CREATININE 1.3 03/23/2017 1316   CALCIUM 9.3 10/21/2022 1305   CALCIUM 9.6 03/23/2017 1316   PROT 6.5 10/21/2022 1305   PROT 6.5 03/23/2017 1317   PROT 6.8 03/23/2017 1316   ALBUMIN 3.9 10/21/2022 1305   ALBUMIN 3.8 03/23/2017 1316   AST 21 10/21/2022 1305   AST 29 03/23/2017 1316   ALT 25 10/21/2022 1305   ALT 28 03/23/2017 1316   ALKPHOS 49 10/21/2022 1305   ALKPHOS 59 03/23/2017 1316   BILITOT 0.7 10/21/2022 1305   BILITOT 1.00 03/23/2017 1316   GFRNONAA >60 10/21/2022 1305   GFRAA >60 03/23/2020 1333    No results found for: "SPEP", "UPEP"  Lab Results  Component Value Date   WBC 10.1 10/21/2022   NEUTROABS 6.4 10/21/2022   HGB 12.8 (L) 10/21/2022   HCT 36.8 (L) 10/21/2022   MCV 90.9 10/21/2022   PLT  287 10/21/2022      Chemistry      Component Value Date/Time   NA 133 (L) 10/21/2022 1305   NA 135 (L) 03/23/2017 1316   K 4.5 10/21/2022 1305   K 4.5 03/23/2017 1316   CL 97 (L) 10/21/2022 1305   CL 94 (L) 03/20/2013 1510   CO2 29 10/21/2022 1305   CO2 26 03/23/2017 1316   BUN 17 10/21/2022 1305   BUN 17.8 03/23/2017 1316   CREATININE 1.11 10/21/2022 1305   CREATININE 1.3 03/23/2017 1316      Component Value Date/Time   CALCIUM 9.3 10/21/2022 1305   CALCIUM 9.6 03/23/2017 1316   ALKPHOS 49 10/21/2022 1305   ALKPHOS 59 03/23/2017 1316   AST 21 10/21/2022 1305   AST 29 03/23/2017 1316   ALT 25 10/21/2022 1305   ALT 28 03/23/2017 1316   BILITOT 0.7 10/21/2022 1305   BILITOT 1.00 03/23/2017 1316       RADIOGRAPHIC STUDIES: I have personally reviewed the radiological images as listed and agreed with the findings in the report. MR Hip Left w/o contrast  Result Date: 10/05/2022 CLINICAL DATA:  Left-sided hip pain for 2-3 months EXAM: MR OF THE LEFT HIP WITHOUT CONTRAST TECHNIQUE: Multiplanar, multisequence MR imaging was performed. No intravenous contrast was administered.  COMPARISON:  None Available. FINDINGS: Bones: No hip fracture, dislocation or avascular necrosis. No periosteal reaction or bone destruction. No aggressive osseous lesion. Normal sacrum and sacroiliac joints. No SI joint widening or erosive changes. Degenerative disease with disc height loss at L3-4, L4-5 and L5-S1 with bilateral facet arthropathy partially visualized. Articular cartilage and labrum Articular cartilage: High-grade partial-thickness cartilage loss of the left femoral head and acetabulum. Partial-thickness cartilage loss of the right femoral head and acetabulum. Labrum:  Left labral degeneration with a left anterior labral tear. Joint or bursal effusion Joint effusion:  No hip joint effusion.  No SI joint effusion. Bursae:  No bursal fluid. Muscles and tendons Flexors: Normal. Extensors: Normal. Abductors: Normal. Adductors: Normal. Gluteals: Normal. Hamstrings: Mild tendinosis of the right hamstring origin. Mild tendinosis of the left hamstring origin with a tiny interstitial tear. Other findings No pelvic free fluid. No fluid collection or hematoma. No inguinal lymphadenopathy. No inguinal hernia. IMPRESSION: 1. No hip fracture, dislocation or avascular necrosis. 2. Moderate osteoarthritis of the left hip. Left labral degeneration with a left anterior labral tear. 3. Mild osteoarthritis of the right hip. 4. Mild tendinosis of the right hamstring origin. 5. Mild tendinosis of the left hamstring origin with a tiny interstitial tear. 6. Lower lumbar spine spondylosis partially visualized and better characterized on the MRI of the lumbar spine performed same day. Electronically Signed   By: Kathreen Devoid M.D.   On: 10/05/2022 10:07   MR Lumbar Spine w/o contrast  Result Date: 10/05/2022 CLINICAL DATA:  Low back pain with symptoms persisting over 6 weeks of treatment EXAM: MRI LUMBAR SPINE WITHOUT CONTRAST TECHNIQUE: Multiplanar, multisequence MR imaging of the lumbar spine was performed. No  intravenous contrast was administered. COMPARISON:  10/05/2017 FINDINGS: Segmentation:  Standard. Alignment:  Scoliosis. Vertebrae:  No fracture, evidence of discitis, or bone lesion. Conus medullaris and cauda equina: Conus extends to the upper L1 level. Conus and cauda equina appear normal. Paraspinal and other soft tissues: Negative for perispinal mass or inflammation. Disc levels: T12- L1: Unremarkable. L1-L2: Large upward migrating disc extrusion also tracking rightward along the descending right L1 nerve root. Advanced thecal sac stenosis and right S1 impingement. Degenerative facet  spurring on both sides. Mild left foraminal narrowing. L2-L3: Disc space narrowing and bulging. Degenerative facet spurring and ligamentum flavum thickening. Advanced spinal stenosis, chronic. Patent foramina L3-L4: Disc collapse and endplate degeneration with endplate and facet spurring eccentric to the right. Right foraminal impingement. Moderate spinal stenosis with asymmetric right subarticular recess stenosis L4-L5: Disc narrowing and bulging with right paracentral to foraminal herniation that is chronic. Ligamentum flavum thickening asymmetric to the left in the setting of prior right laminotomy. Advanced spinal and biforaminal stenosis L5-S1:Disc collapse and endplate degeneration with circumferential disc bulging and ridging. Degenerative facet spurring on both sides. Chronic advanced left foraminal impingement. Moderate spinal stenosis IMPRESSION: 1. L1-2 large right paracentral extrusion with upward migration. Advanced spinal stenosis and right L1 impingement, the herniation is new from 2019. 2. Advanced chronic lumbar spine degeneration with scoliosis at the subjacent levels. 3. L2-3 and L4-5 severe spinal stenosis. L3-4 moderate spinal stenosis. 4. Right foraminal impingement at L3-4, L4-5 and left foraminal impingement at L4-5, L5-S1. Electronically Signed   By: Jorje Guild M.D.   On: 10/05/2022 09:42

## 2022-10-29 NOTE — Assessment & Plan Note (Signed)
This is likely related to chronic kidney disease stage III Observe closely for now

## 2022-10-29 NOTE — Assessment & Plan Note (Signed)
His myeloma panel is stable with no significant changes I will see him again in a year for further follow-up with repeat myeloma panel to be done a week ahead of time

## 2022-10-29 NOTE — Assessment & Plan Note (Signed)
He has elevated serum creatinine, consistent with chronic kidney disease stage III I recommend the patient to increase oral fluid intake and to aim approximately 80 ounces of water per day His elevated creatinine is not due to his MGUS

## 2022-12-01 ENCOUNTER — Telehealth: Payer: Self-pay | Admitting: *Deleted

## 2022-12-01 NOTE — Telephone Encounter (Signed)
Pt has been scheduled for tele pre op on 12/12/22 @ 3 pm. Med rec and consent are done.

## 2022-12-01 NOTE — Telephone Encounter (Signed)
   Name: Mitchell Fields  DOB: 03-23-1936  MRN: [id number]  Primary Cardiologist: Peter Martinique, MD  Chart reviewed as part of pre-operative protocol coverage. Because of [place] past medical history and time since last visit, he will require a follow-up telephone visit in order to better assess preoperative cardiovascular risk.  Pre-op covering staff: - Please schedule appointment and call patient to inform them. If patient already had an upcoming appointment within acceptable timeframe, please add "pre-op clearance" to the appointment notes so provider is aware. - Please contact requesting surgeon's office via preferred method (i.e, phone, fax) to inform them of need for appointment prior to surgery.  Would be okay to hold asa 5-7 days for hx of remote CABG if asymptomatic at the time of his phone appointment.  Elgie Collard, PA-C  12/01/2022, 1:33 PM

## 2022-12-01 NOTE — Telephone Encounter (Signed)
   Pre-operative Risk Assessment    Patient Name: Mitchell Fields  DOB: 1935/12/03 MRN: [id number]      Request for Surgical Clearance    Procedure:   Left Endoscopic Carpal Tunnel Release  Date of Surgery:  Clearance 12/21/22                                 Surgeon:  Dr. Milly Jakob Surgeon's Group or Practice Name:  [facility] Phone number:  [phone] Fax number:  [phone]   Type of Clearance Requested:   - Medical  - Pharmacy:  Hold Aspirin Not Indicated.   Type of Anesthesia:  MAC   Additional requests/questions:    Signed, Greer Ee   12/01/2022, 12:25 PM

## 2022-12-01 NOTE — Telephone Encounter (Signed)
Pt has been scheduled for tele pre op on 12/12/22 @ 3 pm. Med rec and consent are done.      Patient Consent for Virtual Visit        Mitchell Fields has provided verbal consent on 12/01/2022 for a virtual visit (video or telephone).   CONSENT FOR VIRTUAL VISIT FOR:  Mitchell Fields  By participating in this virtual visit I agree to the following:  I hereby voluntarily request, consent and authorize [facility] and its employed or contracted physicians, physician assistants, nurse practitioners or other licensed health care professionals (the Practitioner), to provide me with telemedicine health care services (the "Services") as deemed necessary by the treating Practitioner. I acknowledge and consent to receive the Services by the Practitioner via telemedicine. I understand that the telemedicine visit will involve communicating with the Practitioner through live audiovisual communication technology and the disclosure of certain medical information by electronic transmission. I acknowledge that I have been given the opportunity to request an in-person assessment or other available alternative prior to the telemedicine visit and am voluntarily participating in the telemedicine visit.  I understand that I have the right to withhold or withdraw my consent to the use of telemedicine in the course of my care at any time, without affecting my right to future care or treatment, and that the Practitioner or I may terminate the telemedicine visit at any time. I understand that I have the right to inspect all information obtained and/or recorded in the course of the telemedicine visit and may receive copies of available information for a reasonable fee.  I understand that some of the potential risks of receiving the Services via telemedicine include:  Delay or interruption in medical evaluation due to technological equipment failure or disruption; Information transmitted may not be sufficient (e.g. poor  resolution of images) to allow for appropriate medical decision making by the Practitioner; and/or  In rare instances, security protocols could fail, causing a breach of personal health information.  Furthermore, I acknowledge that it is my responsibility to provide information about my medical history, conditions and care that is complete and accurate to the best of my ability. I acknowledge that Practitioner's advice, recommendations, and/or decision may be based on factors not within their control, such as incomplete or inaccurate data provided by me or distortions of diagnostic images or specimens that may result from electronic transmissions. I understand that the practice of medicine is not an exact science and that Practitioner makes no warranties or guarantees regarding treatment outcomes. I acknowledge that a copy of this consent can be made available to me via my patient portal ([facility]), or I can request a printed copy by calling the office of [facility].    I understand that my insurance will be billed for this visit.   I have read or had this consent read to me. I understand the contents of this consent, which adequately explains the benefits and risks of the Services being provided via telemedicine.  I have been provided ample opportunity to ask questions regarding this consent and the Services and have had my questions answered to my satisfaction. I give my informed consent for the services to be provided through the use of telemedicine in my medical care

## 2022-12-12 ENCOUNTER — Ambulatory Visit: Payer: Medicare Other | Attending: Cardiovascular Disease

## 2022-12-12 DIAGNOSIS — Z01810 Encounter for preprocedural cardiovascular examination: Secondary | ICD-10-CM

## 2022-12-12 NOTE — Progress Notes (Signed)
Virtual Visit via Telephone Note   Because of Mitchell Fields's co-morbid illnesses, he is at least at moderate risk for complications without adequate follow up.  This format is felt to be most appropriate for this patient at this time.  The patient did not have access to video technology/had technical difficulties with video requiring transitioning to audio format only (telephone).  All issues noted in this document were discussed and addressed.  No physical exam could be performed with this format.  Please refer to the patient's chart for his consent to telehealth for Mitchell Fields.  Evaluation Performed:  Preoperative cardiovascular risk assessment _____________   Date:  12/12/2022   Patient ID:  Mitchell Fields, DOB 04-25-1936, MRN [id number] Patient Location:  Home Provider location:   Office  Primary Care Provider:  Ginger Organ., MD Primary Cardiologist:  Peter Martinique, MD  Chief Complaint / Patient Profile   87 y.o. y/o male with a h/o CAD s/p CABG, hypertension, hyperlipidemia, monoclonal gammopathy, IPF, anemia  who is pending left endoscopic carpal tunnel release and presents today for telephonic preoperative cardiovascular risk assessment.  History of Present Illness    Mitchell Fields is a 87 y.o. male who presents via audio/video conferencing for a telehealth visit today.  Pt was last seen in cardiology clinic on 06/27/2022 by Dr. Martinique. At that time Mitchell Fields was doing well with no cardiac complaints. Patient had recently went to [place] and planning to go to [place].  The patient is now pending procedure as outlined above. Since his last visit, he has been doing well with no new cardiac complaints at this time.  He completed his trip to [place] and said it is highly recommended.  He is looking forward to having his surgery on his wrist.  He  He denies chest pain, shortness of breath, lower extremity edema, fatigue, palpitations, melena, hematuria,  hemoptysis, diaphoresis, weakness, presyncope, syncope, orthopnea, and PND.    Past Medical History    Past Medical History:  Diagnosis Date   Anemia    Arthritis    Back pain    Bursitis of left hip    Coronary artery disease    TWO VESSEL BYPASS SURGERY in January 1995   Diverticulosis    Hyperlipidemia    Hypertension    Monoclonal gammopathy    Neuropathy    Past Surgical History:  Procedure Laterality Date   ADENOIDECTOMY     BACK SURGERY  1985, 1995, 2002   X3   COLONOSCOPY     CORONARY ARTERY BYPASS GRAFT     X2. LIMA GRAFT AND A RIMA GRAFT.   INGUINAL HERNIA REPAIR Left    Negative stress echo  08/11/2009   US ECHOCARDIOGRAPHY  June 2012   Normal EF, mild aortic regurgitation, mild MR, LAE, mildly dilated RV, RAE, increased left atrial pressure, moderate to severe TR and mildly increased  PA systolic pressure.     Allergies  Allergies  Allergen Reactions   Cortisone Other (See Comments)    REACTION: shut adrenal glands down if given too much    Home Medications    Prior to Admission medications   Medication Sig Start Date End Date Taking? Authorizing Provider  aspirin 81 MG tablet Take 81 mg by mouth daily.    [provider]  atorvastatin (LIPITOR) 20 MG tablet Take 20 mg by mouth daily.    [provider]  B Complex Vitamins (VITAMIN B COMPLEX PO) Take 1 tablet by  mouth daily.    [provider]  Cholecalciferol (VITAMIN D PO) Take 1,000 Units by mouth daily.    [provider]  Ferrous Sulfate (IRON PO) Take by mouth daily.    [provider]  gabapentin (NEURONTIN) 300 MG capsule Take 300 mg by mouth daily as needed.    [provider]  HYDROcodone-acetaminophen (NORCO/VICODIN) 5-325 MG tablet Take 1 tablet by mouth 2 (two) times daily as needed for moderate pain. 08/18/22   Mcarthur Rossetti, MD  isosorbide mononitrate (IMDUR) 30 MG 24 hr tablet Take 0.5 tablets (15 mg total) by mouth daily.  03/09/22 03/04/23  Loel Dubonnet, NP  metoprolol succinate (TOPROL-XL) 25 MG 24 hr tablet Take 12.5 mg by mouth daily. 07/01/19   [provider]  Misc Natural Products (GLUCOSAMINE CHOND COMPLEX/MSM) TABS take 1 po qd 03/08/18   [provider]  Multiple Vitamins-Minerals (ZINC PO) Take by mouth daily.    [provider]  Nintedanib (OFEV) 100 MG CAPS Take 1 capsule (100 mg total) by mouth 2 (two) times daily. Patient not taking: Reported on 10/12/2022 08/09/22   [name], MD  nitroGLYCERIN (NITROSTAT) 0.4 MG SL tablet Place 1 tablet (0.4 mg total) under the tongue every 5 (five) minutes as needed for chest pain. 10/19/21 12/01/22  Martinique, Peter M, MD  pregabalin (LYRICA) 75 MG capsule as needed. 04/24/19   [provider]  SYNTHROID 75 MCG tablet Take 75 mcg by mouth daily before breakfast.  07/01/19   [provider]    Physical Exam    Vital Signs:  Mitchell Fields does not have vital signs available for review today.  Given telephonic nature of communication, physical exam is limited. AAOx3. NAD. Normal affect.  Speech and respirations are unlabored.  Accessory Clinical Findings    None  Assessment & Plan    1.  Preoperative Cardiovascular Risk Assessment:  Mitchell Fields perioperative risk of a major cardiac event is 0.9% according to the Revised Cardiac Risk Index (RCRI).  Therefore, he is at low risk for perioperative complications.   His functional capacity is fair at 4.31 METs according to the Duke Activity Status Index (DASI). Recommendations: The patient requires an echocardiogram before a disposition can be made regarding surgical risk.                Antiplatelet and/or Anticoagulation Recommendations: Aspirin can be held for 5-7 days prior to his surgery.  Please resume Aspirin post operatively when it is felt to be safe from a bleeding standpoint.    The patient was advised that if he develops new symptoms prior to surgery to  contact our office to arrange for a follow-up visit, and he verbalized understanding.    A copy of this note will be routed to requesting surgeon.  Time:   Today, I have spent 8 minutes with the patient with telehealth technology discussing medical history, symptoms, and management plan.     Mable Fill, Marissa Nestle, NP  12/12/2022, 8:06 AM

## 2023-01-11 NOTE — Progress Notes (Unsigned)
Office Visit    Patient Name: Mitchell Fields Date of Encounter: 01/12/2023  PCP:  Cleatis Polka., MD   [facility]  Cardiologist:  Tarea Skillman [place], MD  Advanced Practice Provider:  No care team member to display Electrophysiologist:  None      Chief Complaint    Mitchell Fields is a 87 y.o. male with a hx of CAD s/p CABG, hypertension, hyperlipidemia, monoclonal gammopathy, IPF, anemia of chronic disease presents today for follow-up   Past Medical History    Past Medical History:  Diagnosis Date   Anemia    Arthritis    Back pain    Bursitis of left hip    Coronary artery disease    TWO VESSEL BYPASS SURGERY in January 1995   Diverticulosis    Hyperlipidemia    Hypertension    Monoclonal gammopathy    Neuropathy    Past Surgical History:  Procedure Laterality Date   ADENOIDECTOMY     BACK SURGERY  1985, 1995, 2002   X3   COLONOSCOPY     CORONARY ARTERY BYPASS GRAFT     X2. LIMA GRAFT AND A RIMA GRAFT.   INGUINAL HERNIA REPAIR Left    Negative stress echo  08/11/2009   US ECHOCARDIOGRAPHY  June 2012   Normal EF, mild aortic regurgitation, mild MR, LAE, mildly dilated RV, RAE, increased left atrial pressure, moderate to severe TR and mildly increased  PA systolic pressure.     Allergies  Allergies  Allergen Reactions   Cortisone Other (See Comments)    REACTION: shut adrenal glands down if given too much    History of Present Illness    Mitchell Fields is a 87 y.o. male with a hx of CAD s/p CABG, hypertension, hyperlipidemia, monoclonal gammopathy, IPF, anemia of chronic disease last seen 01/31/2022  He has a known history of remote MI, CABG in 1995 in [place] (LIMA and RIMA grafts).  Echo June 2012 with normal EF, mild aortic regurgitation, mild MR, mild to moderate LAE, mildly dilated RV, moderate RAE, moderate to severe TR and redundant atrial septum consistent with increased left atrial pressure.  Echo 2014 mild LV dysfunction EF  45-50%, mildly AI/MR.  Saw Dr. Carlyn Reichert in November 2021 for fatigue.  Had been transition from atenolol to Toprol due to decreased LVEF.  Repeat echo unchanged.  He had been diagnosed with IPF via CT and was following with Dr. Isaiah Serge. Jamal Maes on [place] but unable to tolerate due to marked fatigue.   Seen 10/19/2021 doing well from cardiac perspective.  Was recovering from bronchitis with antibiotics.  He was seen 01/31/2022 with 2 episodes of chest pain.  Cardiac PET scan ordered and performed 6 6/23 revealing prior MI with mild  peri-infarct ischemia.  It was moderate high risk in the presence of reduced LVEF and infarction size.  LVEF 44%.  Study detailed below.  We recommended optimization of antianginal therapy. Imdur was added. Since then he denies any chest pain.  He also wore a Zio patch monitor which showed brief nonsustained runs of SVT and frequent PACs and PVCs. These were all asymptomatic.   He is seen today. Denies any chest pain or increased dyspnea or edema. Denies any palpitations or dizziness. He is very active traveling. Went to [place] this past  year and went to [place] in the fall. He did lose weight following this but later gained it back. Saw Dr Bertis Ruddy and no evidence of cancer. Thinks  weight loss may have been related to Ofev so this is on hold for now. He did have recent carpal tunnel surgery and this went well.    EKGs/Labs/Other Studies Reviewed:   The following studies were reviewed today  Cardiac PET 03/08/2022  Narrative & Impression      Findings are consistent with prior myocardial infarction with peri-infarct ischemia. The study is moderate-high risk in the presence reduced LVEF and infarction size.   LV perfusion is abnormal. There is a perfusion defect in the mid & basal inferolateral and basal inferior that worsens with stress and is associated with hypokinesis. This is a moderate (3-6 segments) size and moderate (10-20% of LV myocardium) severity lesion.  Consistent with infarction with small peri-infarct ischemia.   There is no evidence of transient ischemic dilation, TID 1.02   Rest left ventricular function is low normal (normal for modality). Rest EF: 44 %. Stress left ventricular function is normal. Stress EF: 51 %. End diastolic cavity size is normal. End systolic cavity size is normal.   LVEF Reserve is normal at 7%   Myocardial blood flow reserve is not reported in this patient due to technical or patient-specific concerns that affect accuracy (Prior CABG)   Coronary calcium was present on the attenuation correction CT images. Severe coronary calcifications were present. Coronary calcifications were present in the left anterior descending artery, left circumflex artery and right coronary artery distribution(s). Aortic atherosclerosis noted.  Aortic valve calcification noted.   Electronically signed by Riley Lam MD     Echo 03/26/13:- Left ventricle: The cavity size was normal. Wall thickness   was normal. Systolic function was mildly reduced. The   estimated ejection fraction was in the range of 45% to   50%. There is hypokinesis of the basal-mid inferior   posterior myocardium. Doppler parameters are consistent   with abnormal left ventricular relaxation (grade 1   diastolic dysfunction). - Aortic valve: Mild regurgitation. - Mitral valve: Mild regurgitation. - Pulmonary arteries: PA peak pressure: 91mm Hg (S).   Echo 09/01/20: IMPRESSIONS     1. Left ventricular ejection fraction, by estimation, is 45 to 50%. The  left ventricle has mildly decreased function. The left ventricle  demonstrates regional wall motion abnormalities (see scoring  diagram/findings for description). There is mild  concentric left ventricular hypertrophy. Left ventricular diastolic  parameters are consistent with Grade I diastolic dysfunction (impaired  relaxation). Elevated left atrial pressure.   2. Right ventricular systolic function is  normal. The right ventricular  size is mildly enlarged. There is moderately elevated pulmonary artery  systolic pressure. The estimated right ventricular systolic pressure is  46.4 mmHg.   3. Left atrial size was mildly dilated.   4. Right atrial size was moderately dilated.   5. The mitral valve is normal in structure. Mild to moderate mitral valve  regurgitation. No evidence of mitral stenosis.   6. Tricuspid valve regurgitation is moderate.   7. The aortic valve is normal in structure. Aortic valve regurgitation is  mild. Mild to moderate aortic valve sclerosis/calcification is present,  without any evidence of aortic stenosis.   8. The inferior vena cava is normal in size with <50% respiratory  variability, suggesting right atrial pressure of 8 mmHg.    Event monitor 04/01/22: Study Highlights    Normal sinus rhythm. Occasional runs of SVT total 43 runs, longest lasting 13 beats. Frequent PACs 6.5% burden Frequent isolated PVCs burden 5.8%. No sustained tachy or bradyarrhythmias, AV block or pauses No  Afib     Patch Wear Time:  7 days and 2 hours (2023-06-19T11:18:40-0400 to 2023-06-26T14:06:28-0400)   Patient had a min HR of 42 bpm, max HR of 176 bpm, and avg HR of 74 bpm. Predominant underlying rhythm was Sinus Rhythm. Bundle Branch Block/IVCD was present. 43 Supraventricular Tachycardia runs occurred, the run with the fastest interval lasting 6  beats with a max rate of 176 bpm, the longest lasting 13 beats with an avg rate of 126 bpm. Some episodes of Supraventricular Tachycardia may be possible Atrial Tachycardia with variable block. Isolated SVEs were frequent (6.5%, [id number]), SVE Couplets were  rare (<1.0%, 3224), and SVE Triplets were rare (<1.0%, 380). Isolated VEs were frequent (5.8%, 43313), VE Couplets were rare (<1.0%, 2323), and no VE Triplets were present. Ventricular Bigeminy and Trigeminy were present.    EKG: No EKG today  Recent Labs: 10/21/2022: ALT 25; BUN  17; Creatinine 1.11; Hemoglobin 12.8; Platelets 287; Potassium 4.5; Sodium 133  Recent Lipid Panel    Component Value Date/Time   CHOL 178 03/23/2020 1333   TRIG 142 03/23/2020 1333   HDL 54 03/23/2020 1333   CHOLHDL 3.3 03/23/2020 1333   VLDL 28 03/23/2020 1333   LDLCALC 96 03/23/2020 1333   Dated 05/26/22: cholesterol 155, triglycerides 168, HDL 41, LDL 80. CMET and TSH normal.  Home Medications   Current Meds  Medication Sig   aspirin 81 MG tablet Take 81 mg by mouth daily.   atorvastatin (LIPITOR) 20 MG tablet Take 20 mg by mouth daily.   Cholecalciferol (VITAMIN D PO) Take 2,000 Units by mouth daily.   gabapentin (NEURONTIN) 300 MG capsule Take 300 mg by mouth daily as needed.   HYDROcodone-acetaminophen (NORCO/VICODIN) 5-325 MG tablet Take 1 tablet by mouth 2 (two) times daily as needed for moderate pain.   isosorbide mononitrate (IMDUR) 30 MG 24 hr tablet Take 0.5 tablets (15 mg total) by mouth daily.   metoprolol succinate (TOPROL-XL) 25 MG 24 hr tablet Take 12.5 mg by mouth daily.   Misc Natural Products (GLUCOSAMINE CHOND COMPLEX/MSM) TABS take 1 po qd   Multiple Vitamins-Minerals (ZINC PO) Take by mouth daily.   pregabalin (LYRICA) 75 MG capsule as needed.   SYNTHROID 75 MCG tablet Take 75 mcg by mouth daily before breakfast.      Review of Systems      All other systems reviewed and are otherwise negative except as noted above.  Physical Exam    VS:  BP 110/60   Pulse 72   Ht 5\' 9"  (1.753 m)   Wt 152 lb (68.9 kg)   SpO2 98%   BMI 22.45 kg/m  , BMI Body mass index is 22.45 kg/m.  Wt Readings from Last 3 Encounters:  01/12/23 152 lb (68.9 kg)  10/28/22 150 lb (68 kg)  10/12/22 150 lb 3.2 oz (68.1 kg)    GEN: Well nourished, well developed, in no acute distress. HEENT: normal. Neck: Supple, no JVD, carotid bruits, or masses. Cardiac: RRR, no murmurs, rubs, or gallops. No clubbing, cyanosis, edema.  Radials/PT 2+ and equal bilaterally.  Respiratory:   Respirations regular and unlabored, clear to auscultation bilaterally. GI: Soft, nontender, nondistended. MS: No deformity or atrophy. Skin: Warm and dry, no rash. Neuro:  Strength and sensation are intact. Psych: Normal affect.  Assessment & Plan    CAD -  Prior CABG 1995.  Cardiac PET 03/08/2022 consistent with prior MI and mild  peri-infarct ischemia with EF 44%.  He was started on  Imdur 15 mg daily. Stable on low-dose Toprol 12.5 mg daily and will not further increase due to bifascicular block.  Currently no anginal symptoms. Continue atorvastatin, aspirin.  HLD. On lipitor. Last LDL 80. Ideally would like <70 but with recent weight loss will hold off on any adjustment in therapy. Repeat labs with PCP this summer.  HTN - BP well controlled. Continue current antihypertensive regimen Toprol 12.5mg  QD, Imdur 15 mg daily.    Mild LV dysfunction / HFrEF - Euvolemic and well compensated on exam.  Not requiring loop diuretic.  Continue Toprol 12.5 mg daily.  Continue low-salt diet, fluid restrict to less than 2 L.  GDMT limited by relative hypotension.  Bifasisular block /palpitations- no significant AV block, pauses or Afib on monitor. He does have frequent PVCs and PACs. Short runs of SVT. Given the fact he is asymptomatic will not pursue further therapy  6.   IPF - Follows with Dr. Isaiah SergeMannam   Disposition: Follow up in 6 months  Signed, Heaton Sarin [place], MD, [facility] InstituteFACC 01/12/2023, 10:58 AM [facility]

## 2023-01-12 ENCOUNTER — Ambulatory Visit: Payer: Medicare Other | Attending: Cardiology | Admitting: Cardiology

## 2023-01-12 ENCOUNTER — Encounter: Payer: Self-pay | Admitting: Cardiology

## 2023-01-12 VITALS — BP 110/60 | HR 72 | Ht 69.0 in | Wt 152.0 lb

## 2023-01-12 DIAGNOSIS — I25118 Atherosclerotic heart disease of native coronary artery with other forms of angina pectoris: Secondary | ICD-10-CM | POA: Diagnosis not present

## 2023-01-12 DIAGNOSIS — I10 Essential (primary) hypertension: Secondary | ICD-10-CM | POA: Diagnosis not present

## 2023-01-12 DIAGNOSIS — Z951 Presence of aortocoronary bypass graft: Secondary | ICD-10-CM

## 2023-01-12 DIAGNOSIS — I5022 Chronic systolic (congestive) heart failure: Secondary | ICD-10-CM | POA: Diagnosis not present

## 2023-01-12 DIAGNOSIS — E785 Hyperlipidemia, unspecified: Secondary | ICD-10-CM

## 2023-01-12 NOTE — Patient Instructions (Signed)
Medication Instructions:   Your physician recommends that you continue on your current medications as directed. Please refer to the Current Medication list given to you today.  *If you need a refill on your cardiac medications before your next appointment, please call your pharmacy*   Lab Work: NONE ordered at this time of appointment   If you have labs (blood work) drawn today and your tests are completely normal, you will receive your results only by: MyChart Message (if you have MyChart) OR A paper copy in the mail If you have any lab test that is abnormal or we need to change your treatment, we will call you to review the results.   Testing/Procedures: NONE ordered at this time of appointment   Follow-Up: At [facility], you and your health needs are our priority.  As part of our continuing mission to provide you with exceptional heart care, we have created designated Provider Care Teams.  These Care Teams include your primary Cardiologist (physician) and Advanced Practice Providers (APPs -  Physician Assistants and Nurse Practitioners) who all work together to provide you with the care you need, when you need it.   Your next appointment:   6 month(s)  Provider:   Peter Jordan, MD     Other Instructions   

## 2023-03-06 ENCOUNTER — Other Ambulatory Visit (HOSPITAL_BASED_OUTPATIENT_CLINIC_OR_DEPARTMENT_OTHER): Payer: Self-pay | Admitting: Family

## 2023-03-06 NOTE — Telephone Encounter (Signed)
Rx(s) sent to pharmacy electronically.  

## 2023-03-15 ENCOUNTER — Ambulatory Visit (INDEPENDENT_AMBULATORY_CARE_PROVIDER_SITE_OTHER): Payer: Medicare Other | Admitting: Orthopaedic Surgery

## 2023-03-15 ENCOUNTER — Encounter: Payer: Self-pay | Admitting: Orthopaedic Surgery

## 2023-03-15 DIAGNOSIS — M25551 Pain in right hip: Secondary | ICD-10-CM | POA: Diagnosis not present

## 2023-03-15 MED ORDER — HYDROCODONE-ACETAMINOPHEN 7.5-325 MG PO TABS: 1.0000 | ORAL_TABLET | Freq: Two times a day (BID) | ORAL | 0 refills | Status: DC | PRN

## 2023-03-15 MED ORDER — METHYLPREDNISOLONE ACETATE 40 MG/ML IJ SUSP
40.0000 mg | INTRAMUSCULAR | Status: AC | PRN
  Administered 2023-03-15: 40 mg via INTRA_ARTICULAR

## 2023-03-15 MED ORDER — LIDOCAINE HCL 1 % IJ SOLN
3.0000 mL | INTRAMUSCULAR | Status: AC | PRN
  Administered 2023-03-15: 3 mL

## 2023-03-15 NOTE — Progress Notes (Signed)
The patient comes in today requesting an injection around his right hip trochanteric area.  We have done this on the left side and his pain finally dissipated on the left side.  A MRI earlier this year of the pelvis did show arthritic changes in the right hip but he is not painful in the groin at all.  He also had a herniated disc at L1-L2 and that may be causing some of the pain in the right hip.  I can easily put his right hip through internal and external rotation.  His pain is only over the sciatic region in the trochanteric area.  Per his request I did place a steroid injection over this area which he tolerated well.  He is 87 years old.  He is asking for a one-time prescription of hydrocodone since he is traveling to [place] coming up.  I told him to use this very sparingly and that I would not refill this.  All questions and concerns were answered and addressed.  Follow-up is as needed.     Procedure Note  Patient: Mitchell Fields             Date of Birth: 09-27-1936           MRN: [id number]             Visit Date: 03/15/2023  Procedures: Visit Diagnoses:  1. Pain of right hip     Large Joint Inj: R greater trochanter on 03/15/2023 9:46 AM Indications: pain and diagnostic evaluation Details: 22 G 1.5 in needle, lateral approach  Arthrogram: No  Medications: 3 mL lidocaine 1 %; 40 mg methylPREDNISolone acetate 40 MG/ML Outcome: tolerated well, no immediate complications Procedure, treatment alternatives, risks and benefits explained, specific risks discussed. Consent was given by the patient. Immediately prior to procedure a time out was called to verify the correct patient, procedure, equipment, support staff and site/side marked as required. Patient was prepped and draped in the usual sterile fashion.

## 2023-03-20 ENCOUNTER — Ambulatory Visit: Payer: Medicare Other | Admitting: Orthopaedic Surgery

## 2023-04-19 ENCOUNTER — Other Ambulatory Visit (HOSPITAL_COMMUNITY): Payer: Self-pay | Admitting: Internal Medicine

## 2023-04-19 ENCOUNTER — Ambulatory Visit (HOSPITAL_COMMUNITY)
Admission: RE | Admit: 2023-04-19 | Discharge: 2023-04-19 | Disposition: A | Discharge status: Home / Self Care | Payer: Medicare Other | Source: Ambulatory Visit | Attending: Vascular Surgery | Admitting: Vascular Surgery

## 2023-04-19 DIAGNOSIS — R0989 Other specified symptoms and signs involving the circulatory and respiratory systems: Secondary | ICD-10-CM

## 2023-08-22 ENCOUNTER — Other Ambulatory Visit: Payer: Self-pay | Admitting: Adult Health

## 2023-08-22 DIAGNOSIS — M5416 Radiculopathy, lumbar region: Secondary | ICD-10-CM

## 2023-08-22 DIAGNOSIS — M21371 Foot drop, right foot: Secondary | ICD-10-CM

## 2023-08-22 DIAGNOSIS — M51372 Other intervertebral disc degeneration, lumbosacral region with discogenic back pain and lower extremity pain: Secondary | ICD-10-CM

## 2023-08-24 ENCOUNTER — Ambulatory Visit
Admission: RE | Admit: 2023-08-24 | Discharge: 2023-08-24 | Disposition: A | Discharge status: Home / Self Care | Payer: Medicare Other | Source: Ambulatory Visit | Attending: Adult Health | Admitting: Adult Health

## 2023-08-24 DIAGNOSIS — M51372 Other intervertebral disc degeneration, lumbosacral region with discogenic back pain and lower extremity pain: Secondary | ICD-10-CM

## 2023-08-24 DIAGNOSIS — M21371 Foot drop, right foot: Secondary | ICD-10-CM

## 2023-08-24 DIAGNOSIS — M5416 Radiculopathy, lumbar region: Secondary | ICD-10-CM

## 2023-10-17 ENCOUNTER — Telehealth: Payer: Self-pay

## 2023-10-17 ENCOUNTER — Telehealth: Payer: Self-pay | Admitting: *Deleted

## 2023-10-17 NOTE — Telephone Encounter (Signed)
 Patient is scheduled for telephone visit med rec and consent are done     Patient Consent for Virtual Visit         Mitchell Fields has provided verbal consent on 10/17/2023 for a virtual visit (video or telephone).   CONSENT FOR VIRTUAL VISIT FOR:  Mitchell Fields  By participating in this virtual visit I agree to the following:  I hereby voluntarily request, consent and authorize [facility] and its employed or contracted physicians, physician assistants, nurse practitioners or other licensed health care professionals (the Practitioner), to provide me with telemedicine health care services (the "Services) as deemed necessary by the treating Practitioner. I acknowledge and consent to receive the Services by the Practitioner via telemedicine. I understand that the telemedicine visit will involve communicating with the Practitioner through live audiovisual communication technology and the disclosure of certain medical information by electronic transmission. I acknowledge that I have been given the opportunity to request an in-person assessment or other available alternative prior to the telemedicine visit and am voluntarily participating in the telemedicine visit.  I understand that I have the right to withhold or withdraw my consent to the use of telemedicine in the course of my care at any time, without affecting my right to future care or treatment, and that the Practitioner or I may terminate the telemedicine visit at any time. I understand that I have the right to inspect all information obtained and/or recorded in the course of the telemedicine visit and may receive copies of available information for a reasonable fee.  I understand that some of the potential risks of receiving the Services via telemedicine include:  Delay or interruption in medical evaluation due to technological equipment failure or disruption; Information transmitted may not be sufficient (e.g. poor resolution of  images) to allow for appropriate medical decision making by the Practitioner; and/or  In rare instances, security protocols could fail, causing a breach of personal health information.  Furthermore, I acknowledge that it is my responsibility to provide information about my medical history, conditions and care that is complete and accurate to the best of my ability. I acknowledge that Practitioner's advice, recommendations, and/or decision may be based on factors not within their control, such as incomplete or inaccurate data provided by me or distortions of diagnostic images or specimens that may result from electronic transmissions. I understand that the practice of medicine is not an exact science and that Practitioner makes no warranties or guarantees regarding treatment outcomes. I acknowledge that a copy of this consent can be made available to me via my patient portal [facility] MyChart), or I can request a printed copy by calling the office of  HeartCare.    I understand that my insurance will be billed for this visit.   I have read or had this consent read to me. I understand the contents of this consent, which adequately explains the benefits and risks of the Services being provided via telemedicine.  I have been provided ample opportunity to ask questions regarding this consent and the Services and have had my questions answered to my satisfaction. I give my informed consent for the services to be provided through the use of telemedicine in my medical care

## 2023-10-17 NOTE — Telephone Encounter (Signed)
   Name: Mitchell Fields  DOB: 1936/04/12  MRN: [id number]  Primary Cardiologist: Peter Jordan, MD Last seen 01/2023   Preoperative team, please contact this patient and set up a phone call appointment for further preoperative risk assessment. Please obtain consent and complete medication review. Thank you for your help.  I confirm that guidance regarding antiplatelet and oral anticoagulation therapy has been completed and, if necessary, noted below.  Per office protocol, if patient is without any new symptoms or concerns at the time of their virtual visit, he/she may hold ASA for 7 days prior to procedure. Please resume ASA as soon as possible postprocedure, at the discretion of the surgeon.    I also confirmed the patient resides in the state of  . As per [facility] Medical Board telemedicine laws, the patient must reside in the state in which the provider is licensed.   Lamarr Satterfield, NP 10/17/2023, 3:03 PM [facility]

## 2023-10-17 NOTE — Telephone Encounter (Signed)
   Pre-operative Risk Assessment    Patient Name: Mitchell Fields  DOB: 1935/10/25 MRN: [id number]   Date of last office visit: 01/12/23 DR. JORDAN Date of next office visit: NONE   Request for Surgical Clearance    Procedure:   B/ OPEN L1, L2, L3, L4, L5 DECOMPRESSIONS WITH MEDIAL FACETECTOMIES (LUMBAR STENOSIS WITH NEUROGENIC CLAUDICATION)  Date of Surgery:  Clearance 11/10/23                                Surgeon:  DR. TORIBIO LATHE Surgeon's Group or Practice Name:  [facility] Phone number:  [phone] Fax number:  [phone]   Type of Clearance Requested:   - Medical  - Pharmacy:  Hold Aspirin     Type of Anesthesia:  General    Additional requests/questions:    Bonney Niels Jest   10/17/2023, 10:33 AM

## 2023-10-17 NOTE — Telephone Encounter (Signed)
 Patient is scheduled for telephone visit med rec and consent are done

## 2023-10-20 ENCOUNTER — Inpatient Hospital Stay: Payer: Medicare Other | Attending: Hematology and Oncology

## 2023-10-20 DIAGNOSIS — N183 Chronic kidney disease, stage 3 unspecified: Secondary | ICD-10-CM | POA: Diagnosis not present

## 2023-10-20 DIAGNOSIS — D649 Anemia, unspecified: Secondary | ICD-10-CM | POA: Insufficient documentation

## 2023-10-20 DIAGNOSIS — D472 Monoclonal gammopathy: Secondary | ICD-10-CM | POA: Insufficient documentation

## 2023-10-20 LAB — COMPREHENSIVE METABOLIC PANEL
ALT: 24 U/L (ref 0–44)
AST: 23 U/L (ref 15–41)
Albumin: 4.1 g/dL (ref 3.5–5.0)
Alkaline Phosphatase: 62 U/L (ref 38–126)
Anion gap: 6 (ref 5–15)
BUN: 26 mg/dL — ABNORMAL HIGH (ref 8–23)
CO2: 28 mmol/L (ref 22–32)
Calcium: 9.3 mg/dL (ref 8.9–10.3)
Chloride: 104 mmol/L (ref 98–111)
Creatinine, Ser: 1.01 mg/dL (ref 0.61–1.24)
GFR, Estimated: >60 mL/min (ref >60)
Glucose, Bld: 130 mg/dL — ABNORMAL HIGH (ref 70–99)
Potassium: 4.3 mmol/L (ref 3.5–5.1)
Sodium: 138 mmol/L (ref 135–145)
Total Bilirubin: 0.4 mg/dL (ref 0.0–1.2)
Total Protein: 7 g/dL (ref 6.5–8.1)

## 2023-10-20 LAB — CBC WITH DIFFERENTIAL/PLATELET
Abs Immature Granulocytes: 0.01 10*3/uL (ref 0.00–0.07)
Basophils Absolute: 0 10*3/uL (ref 0.0–0.1)
Basophils Relative: 0 %
Eosinophils Absolute: 0.2 10*3/uL (ref 0.0–0.5)
Eosinophils Relative: 3 %
HCT: 34.6 % — ABNORMAL LOW (ref 39.0–52.0)
Hemoglobin: 11.7 g/dL — ABNORMAL LOW (ref 13.0–17.0)
Immature Granulocytes: 0 %
Lymphocytes Relative: 27 %
Lymphs Abs: 2 10*3/uL (ref 0.7–4.0)
MCH: 31.3 pg (ref 26.0–34.0)
MCHC: 33.8 g/dL (ref 30.0–36.0)
MCV: 92.5 fL (ref 80.0–100.0)
Monocytes Absolute: 0.7 10*3/uL (ref 0.1–1.0)
Monocytes Relative: 9 %
Neutro Abs: 4.3 10*3/uL (ref 1.7–7.7)
Neutrophils Relative %: 61 %
Platelets: 254 10*3/uL (ref 150–400)
RBC: 3.74 MIL/uL — ABNORMAL LOW (ref 4.22–5.81)
RDW: 13.5 % (ref 11.5–15.5)
WBC: 7.2 10*3/uL (ref 4.0–10.5)
nRBC: 0 % (ref 0.0–0.2)

## 2023-10-23 LAB — KAPPA/LAMBDA LIGHT CHAINS
Kappa free light chain: 60.2 mg/L — ABNORMAL HIGH (ref 3.3–19.4)
Kappa, lambda light chain ratio: 3.42 — ABNORMAL HIGH (ref 0.26–1.65)
Lambda free light chains: 17.6 mg/L (ref 5.7–26.3)

## 2023-10-27 LAB — MULTIPLE MYELOMA PANEL, SERUM
Albumin SerPl Elph-Mcnc: 3.5 g/dL (ref 2.9–4.4)
Albumin/Glob SerPl: 1.2 (ref 0.7–1.7)
Alpha 1: 0.2 g/dL (ref 0.0–0.4)
Alpha2 Glob SerPl Elph-Mcnc: 0.8 g/dL (ref 0.4–1.0)
B-Globulin SerPl Elph-Mcnc: 0.8 g/dL (ref 0.7–1.3)
Gamma Glob SerPl Elph-Mcnc: 1.1 g/dL (ref 0.4–1.8)
Globulin, Total: 3 g/dL (ref 2.2–3.9)
IgA: 178 mg/dL (ref 61–437)
IgG (Immunoglobin G), Serum: 1218 mg/dL (ref 603–1613)
IgM (Immunoglobulin M), Srm: 12 mg/dL — ABNORMAL LOW (ref 15–143)
M Protein SerPl Elph-Mcnc: 0.6 g/dL — ABNORMAL HIGH
Total Protein ELP: 6.5 g/dL (ref 6.0–8.5)

## 2023-10-28 ENCOUNTER — Emergency Department (HOSPITAL_BASED_OUTPATIENT_CLINIC_OR_DEPARTMENT_OTHER): Payer: Medicare Other | Admitting: Radiology

## 2023-10-28 ENCOUNTER — Encounter (HOSPITAL_BASED_OUTPATIENT_CLINIC_OR_DEPARTMENT_OTHER): Payer: Self-pay | Admitting: Emergency Medicine

## 2023-10-28 ENCOUNTER — Emergency Department (HOSPITAL_BASED_OUTPATIENT_CLINIC_OR_DEPARTMENT_OTHER)
Admission: EM | Admit: 2023-10-28 | Discharge: 2023-10-28 | Disposition: A | Discharge status: Home / Self Care | Payer: Medicare Other | Attending: Emergency Medicine | Admitting: Emergency Medicine

## 2023-10-28 ENCOUNTER — Other Ambulatory Visit (HOSPITAL_BASED_OUTPATIENT_CLINIC_OR_DEPARTMENT_OTHER): Payer: Self-pay

## 2023-10-28 ENCOUNTER — Other Ambulatory Visit: Payer: Self-pay

## 2023-10-28 DIAGNOSIS — L03011 Cellulitis of right finger: Secondary | ICD-10-CM | POA: Diagnosis not present

## 2023-10-28 DIAGNOSIS — R2231 Localized swelling, mass and lump, right upper limb: Secondary | ICD-10-CM | POA: Diagnosis present

## 2023-10-28 DIAGNOSIS — Z7982 Long term (current) use of aspirin: Secondary | ICD-10-CM | POA: Diagnosis not present

## 2023-10-28 LAB — CBC WITH DIFFERENTIAL/PLATELET
Abs Immature Granulocytes: 0.02 10*3/uL (ref 0.00–0.07)
Basophils Absolute: 0 10*3/uL (ref 0.0–0.1)
Basophils Relative: 0 %
Eosinophils Absolute: 0.1 10*3/uL (ref 0.0–0.5)
Eosinophils Relative: 1 %
HCT: 31.1 % — ABNORMAL LOW (ref 39.0–52.0)
Hemoglobin: 10.4 g/dL — ABNORMAL LOW (ref 13.0–17.0)
Immature Granulocytes: 0 %
Lymphocytes Relative: 15 %
Lymphs Abs: 1.3 10*3/uL (ref 0.7–4.0)
MCH: 31 pg (ref 26.0–34.0)
MCHC: 33.4 g/dL (ref 30.0–36.0)
MCV: 92.6 fL (ref 80.0–100.0)
Monocytes Absolute: 0.7 10*3/uL (ref 0.1–1.0)
Monocytes Relative: 8 %
Neutro Abs: 7.1 10*3/uL (ref 1.7–7.7)
Neutrophils Relative %: 76 %
Platelets: 242 10*3/uL (ref 150–400)
RBC: 3.36 MIL/uL — ABNORMAL LOW (ref 4.22–5.81)
RDW: 13.3 % (ref 11.5–15.5)
WBC: 9.3 10*3/uL (ref 4.0–10.5)
nRBC: 0 % (ref 0.0–0.2)

## 2023-10-28 LAB — COMPREHENSIVE METABOLIC PANEL
ALT: 15 U/L (ref 0–44)
AST: 15 U/L (ref 15–41)
Albumin: 3.8 g/dL (ref 3.5–5.0)
Alkaline Phosphatase: 51 U/L (ref 38–126)
Anion gap: 8 (ref 5–15)
BUN: 18 mg/dL (ref 8–23)
CO2: 30 mmol/L (ref 22–32)
Calcium: 8.6 mg/dL — ABNORMAL LOW (ref 8.9–10.3)
Chloride: 100 mmol/L (ref 98–111)
Creatinine, Ser: 1.08 mg/dL (ref 0.61–1.24)
GFR, Estimated: >60 mL/min (ref >60)
Glucose, Bld: 150 mg/dL — ABNORMAL HIGH (ref 70–99)
Potassium: 3.9 mmol/L (ref 3.5–5.1)
Sodium: 138 mmol/L (ref 135–145)
Total Bilirubin: 0.6 mg/dL (ref 0.0–1.2)
Total Protein: 6.7 g/dL (ref 6.5–8.1)

## 2023-10-28 LAB — URINALYSIS, W/ REFLEX TO CULTURE (INFECTION SUSPECTED)
Bacteria, UA: NONE SEEN
Bilirubin Urine: NEGATIVE
Glucose, UA: NEGATIVE mg/dL
Hgb urine dipstick: NEGATIVE
Ketones, ur: NEGATIVE mg/dL
Leukocytes,Ua: NEGATIVE
Nitrite: NEGATIVE
Protein, ur: 30 mg/dL — AB
Specific Gravity, Urine: 1.033 — ABNORMAL HIGH (ref 1.005–1.030)
pH: 5.5 (ref 5.0–8.0)

## 2023-10-28 LAB — LACTIC ACID, PLASMA: Lactic Acid, Venous: 1.3 mmol/L (ref 0.5–1.9)

## 2023-10-28 MED ORDER — AMOXICILLIN-POT CLAVULANATE 875-125 MG PO TABS
1.0000 | ORAL_TABLET | Freq: Once | ORAL | Status: AC
  Administered 2023-10-28: 1 via ORAL
  Filled 2023-10-28: qty 1

## 2023-10-28 MED ORDER — AMOXICILLIN-POT CLAVULANATE 875-125 MG PO TABS
1.0000 | ORAL_TABLET | Freq: Two times a day (BID) | ORAL | 0 refills | Status: DC
  Filled 2023-10-28: qty 14, 7d supply, fill #0

## 2023-10-28 NOTE — ED Provider Notes (Signed)
Mitchell Fields EMERGENCY DEPARTMENT AT [facility] Provider Note   CSN: [id number] Arrival date & time: 10/28/23  1152     History  Chief Complaint  Patient presents with   Animal Bite    Mitchell Fields is a 88 y.o. male.  Patient reports he was bitten by a family member's dog 9 days ago.  Patient reports he initially had some redness to his thumb that resolved.  Patient saw his primary care physician Dr. Dia Crawford who did not feel like he was developing any infection.  Patient states now he has a swollen red area on the top of his hand.  Patient reports that the dog's shots were up-to-date.  Patient denies any fever or chills.  He has not seen any streaking.  The history is provided by the patient. No language interpreter was used.  Animal Bite Associated symptoms: no fever        Home Medications Prior to Admission medications   Medication Sig Start Date End Date Taking? Authorizing Provider  amoxicillin-clavulanate (AUGMENTIN) 875-125 MG tablet Take 1 tablet by mouth 2 (two) times daily. 10/28/23  Yes Elson Areas, PA-C  aspirin 81 MG tablet Take 81 mg by mouth daily.    [provider]  atorvastatin (LIPITOR) 20 MG tablet Take 20 mg by mouth daily.    [provider]  Cholecalciferol (VITAMIN D PO) Take 2,000 Units by mouth daily.    [provider]  gabapentin (NEURONTIN) 300 MG capsule Take 300 mg by mouth daily as needed.    [provider]  HYDROcodone-acetaminophen (NORCO) 7.5-325 MG tablet Take 1 tablet by mouth 2 (two) times daily as needed for moderate pain. 03/15/23   Kathryne Hitch, MD  isosorbide mononitrate (IMDUR) 30 MG 24 hr tablet TAKE 1/2 TABLET DAILY 03/06/23   [place], Peter M, MD  metoprolol succinate (TOPROL-XL) 25 MG 24 hr tablet Take 12.5 mg by mouth daily. 07/01/19   [provider]  Misc Natural Products (GLUCOSAMINE CHOND COMPLEX/MSM) TABS take 1 po qd 03/08/18   [provider]  Multiple  Vitamins-Minerals (ZINC PO) Take by mouth daily.    [provider]  Nintedanib (OFEV) 100 MG CAPS Take 1 capsule (100 mg total) by mouth 2 (two) times daily. Patient not taking: Reported on 10/12/2022 08/09/22   Chilton Greathouse, MD  nitroGLYCERIN (NITROSTAT) 0.4 MG SL tablet Place 1 tablet (0.4 mg total) under the tongue every 5 (five) minutes as needed for chest pain. 10/19/21 01/12/23  [place], Peter M, MD  pregabalin (LYRICA) 75 MG capsule as needed. 04/24/19   [provider]  SYNTHROID 75 MCG tablet Take 75 mcg by mouth daily before breakfast.  07/01/19   [provider]      Allergies    Cortisone    Review of Systems   Review of Systems  Constitutional:  Negative for fever.  All other systems reviewed and are negative.   Physical Exam Updated Vital Signs BP 134/62 (BP Location: Left Arm)   Pulse 86   Temp 97.8 F (36.6 C)   Resp 14   Ht 5\' 9"  (1.753 m)   Wt 68.9 kg   SpO2 97%   BMI 22.43 kg/m  Physical Exam Vitals and nursing note reviewed.  Constitutional:      Appearance: He is well-developed.  HENT:     Head: Normocephalic.  Cardiovascular:     Rate and Rhythm: Normal rate.  Pulmonary:     Effort: Pulmonary effort is normal.  Abdominal:     General: There is no distension.  Musculoskeletal:        General: Normal range of motion.     Cervical back: Normal range of motion.     Comments: Redness dorsal right hand approximately 15 cm area full range of motion fingers slight swelling fingers neurovascular neurosensory intact  Skin:    General: Skin is warm.  Neurological:     General: No focal deficit present.     Mental Status: He is alert and oriented to person, place, and time.     ED Results / Procedures / Treatments   Labs (all labs ordered are listed, but only abnormal results are displayed) Labs Reviewed  COMPREHENSIVE METABOLIC PANEL - Abnormal; Notable for the following components:      Result Value   Glucose, Bld 150 (*)     Calcium 8.6 (*)    All other components within normal limits  CBC WITH DIFFERENTIAL/PLATELET - Abnormal; Notable for the following components:   RBC 3.36 (*)    Hemoglobin 10.4 (*)    HCT 31.1 (*)    All other components within normal limits  URINALYSIS, W/ REFLEX TO CULTURE (INFECTION SUSPECTED) - Abnormal; Notable for the following components:   Specific Gravity, Urine 1.033 (*)    Protein, ur 30 (*)    All other components within normal limits  LACTIC ACID, PLASMA  LACTIC ACID, PLASMA    EKG None  Radiology DG Chest 2 View Result Date: 10/28/2023 CLINICAL DATA:  Right hand infection. EXAM: CHEST - 2 VIEW COMPARISON:  10/19/2021 FINDINGS: The heart size and mediastinal contours are within normal limits. Prior CABG. Stable bibasilar scarring. The lungs are otherwise clear. Several old right rib fracture deformities are again noted. IMPRESSION: No active cardiopulmonary disease. Electronically Signed   By: Danae Orleans M.D.   On: 10/28/2023 13:39    Procedures Procedures    Medications Ordered in ED Medications  amoxicillin-clavulanate (AUGMENTIN) 875-125 MG per tablet 1 tablet (1 tablet Oral Given 10/28/23 1448)    ED Course/ Medical Decision Making/ A&P                                 Medical Decision Making Patient reports he was bitten by dog 9 days ago.  Patient complains of swelling to his right hand.  Amount and/or Complexity of Data Reviewed Labs: ordered. Radiology: ordered and independent interpretation performed. Decision-making details documented in ED Course.  Risk Prescription drug management. Risk Details: Patient is given a prescription for Augmentin.  He is advised to see his primary care physician for recheck in 2 to 3 days.  He is advised to return to the emergency department symptoms worsen or change.           Final Clinical Impression(s) / ED Diagnoses Final diagnoses:  Cellulitis of finger of right hand    Rx / DC Orders ED  Discharge Orders          Ordered    amoxicillin-clavulanate (AUGMENTIN) 875-125 MG tablet  2 times daily        10/28/23 1437           An After Visit Summary was printed and given to the patient.    Elson Areas, Cordelia Poche 10/28/23 1856    Glyn Ade, MD 10/28/23 2123

## 2023-10-28 NOTE — Discharge Instructions (Addendum)
Elevate hand.  Return if increased redness or swelling.  Watch for any streaking going up your arm.

## 2023-10-28 NOTE — ED Triage Notes (Signed)
Pt via pov from home with swelling to his right hand. He states he was bitten by a dog 9 days ago; the thumb began to swell fairly quickly. He saw his PCP, who felt like it would be ok without antibiotics. The thumb swelling has gotten better, but the rest of the hand began swelling 2 days ago. Pt alert & oriented, nad noted.

## 2023-10-28 NOTE — ED Notes (Signed)
Dc instructions reviewed with patient. Patient voiced understanding. Dc with belongings.

## 2023-10-29 NOTE — Progress Notes (Unsigned)
Virtual Visit via Telephone Note   Because of Mitchell Fields's co-morbid illnesses, he is at least at moderate risk for complications without adequate follow up.  This format is felt to be most appropriate for this patient at this time.  The patient did not have access to video technology/had technical difficulties with video requiring transitioning to audio format only (telephone).  All issues noted in this document were discussed and addressed.  No physical exam could be performed with this format.  Please refer to the patient's chart for his consent to telehealth for [facility].  Evaluation Performed:  Preoperative cardiovascular risk assessment _____________   Date:  10/30/2023   Patient ID:  Mitchell Fields, DOB 09-Jan-1936, MRN [id number] Patient Location:  Home Provider location:   Office  Primary Care Provider:  Cleatis Polka., MD Primary Cardiologist:  Peter [place], MD  Chief Complaint / Patient Profile   88 y.o. y/o male with a h/o   Coronary artery disease  Hx of remote MI s/p CABG in 1995 Myocardial PET 03/08/22: inf-lat infarct with peri-infarct ischemia, EF 44, mod to high risk  HFmrEF (heart failure with mildly reduced ejection fraction) TTE 09/01/20: EF 45-50, mild LVH, Gr 1 DD, NL RVSF, RVSP 46.4, mild LAE, mod RAE, mild to mod MR, mod TR, mild AI, AV sclerosis, RAP 8, inf-lat and inf HK Hypertension  Hyperlipidemia  PVCs  Monitor 03/2022: 5.8% burden  Carotid artery disease [place] 04/19/23: R 1-39, L 40-59 Monoclonal gammopathy ILD (Interstitial Lung Disease)  Anemia of chronic disease   who is pending lumbar spine surgery under gen anesthesia with Dr. Princella Ion 11/10/23 and presents today for telephonic preoperative cardiovascular risk assessment.    History of Present Illness    Mitchell Fields is a 88 y.o. male who presents via audio/video conferencing for a telehealth visit today.  Pt was last seen in cardiology clinic on 01/12/23 by Dr. [place].  At that  time Mitchell Fields was doing well without recurrent angina on current therapy.  The patient is now pending procedure as outlined above. Since his last visit, he has been stable. He has not had any chest pain or shortness of breath. His back does limit what he can do and he has balance issues. However, he is still able to climb a flight of steps or do simple household chores.    Physical Exam    Vital Signs:  Mitchell Fields does not have vital signs available for review today.  Given telephonic nature of communication, physical exam is limited. AAOx3. NAD. Normal affect.  Speech and respirations are unlabored.  Accessory Clinical Findings    None    Assessment & Plan    Assessment & Plan Preoperative cardiovascular examination Mitchell Fields's perioperative risk of a major cardiac event is 6.6% according to the Revised Cardiac Risk Index (RCRI).  Therefore, he is at high risk for perioperative complications.   His functional capacity is fair at 4.64 METs according to the Duke Activity Status Index (DASI). Recommendations: According to ACC/AHA guidelines, no further cardiovascular testing needed.  The patient may proceed to surgery at acceptable risk.   Antiplatelet and/or Anticoagulation Recommendations: Aspirin can be held for 7 days prior to his surgery.  Please resume Aspirin post operatively when it is felt to be safe from a bleeding standpoint.    A copy of this note will be routed to requesting surgeon.  Time:   Today, I have spent 8.5 minutes with the patient with telehealth technology  discussing medical history, symptoms, and management plan.     Mitchell Newcomer, PA-C  10/30/2023, 8:13 PM

## 2023-10-30 ENCOUNTER — Ambulatory Visit: Payer: Medicare Other | Attending: Physician Assistant | Admitting: Physician Assistant

## 2023-10-30 ENCOUNTER — Encounter: Payer: Self-pay | Admitting: Physician Assistant

## 2023-10-30 DIAGNOSIS — Z01810 Encounter for preprocedural cardiovascular examination: Secondary | ICD-10-CM | POA: Diagnosis not present

## 2023-10-31 ENCOUNTER — Inpatient Hospital Stay: Payer: Medicare Other | Admitting: Hematology and Oncology

## 2023-10-31 ENCOUNTER — Encounter: Payer: Self-pay | Admitting: Hematology and Oncology

## 2023-10-31 VITALS — BP 122/59 | HR 84 | Temp 99.2°F | Resp 17 | Ht 69.0 in | Wt 150.6 lb

## 2023-10-31 DIAGNOSIS — D638 Anemia in other chronic diseases classified elsewhere: Secondary | ICD-10-CM | POA: Diagnosis not present

## 2023-10-31 DIAGNOSIS — D472 Monoclonal gammopathy: Secondary | ICD-10-CM

## 2023-10-31 NOTE — Assessment & Plan Note (Signed)
This is likely related to chronic kidney disease stage III Observe closely for now

## 2023-10-31 NOTE — Assessment & Plan Note (Signed)
His myeloma panel is stable with no significant changes I will see him again in a year for further follow-up with repeat myeloma panel to be done a week ahead of time

## 2023-10-31 NOTE — Progress Notes (Signed)
[facility] OFFICE PROGRESS NOTE  Patient Care Team: Cleatis Polka., MD as PCP - General (Internal Medicine) [place], Peter M, MD as PCP - Cardiology (Cardiology) Louis Meckel, MD (Inactive) as Attending Physician (Gastroenterology) [place], Peter M, MD as Attending Physician (Cardiology) Donzetta Starch, MD as Consulting Physician (Dermatology) System, Provider Not In Artis Delay, MD as Consulting Physician (Hematology and Oncology)  ASSESSMENT & PLAN:  Monoclonal gammopathy His myeloma panel is stable with no significant changes I will see him again in a year for further follow-up with repeat myeloma panel to be done a week ahead of time  Anemia due to chronic illness This is likely related to chronic kidney disease stage III Observe closely for now  No orders of the defined types were placed in this encounter.   All questions were answered. The patient knows to call the clinic with any problems, questions or concerns. The total time spent in the appointment was 20 minutes encounter with patients including review of chart and various tests results, discussions about plan of care and coordination of care plan   Artis Delay, MD 10/31/2023 1:10 PM  INTERVAL HISTORY: Please see below for problem oriented charting. he returns for surveillance follow-up for IgG kappa MGUS He is doing well except for recent dog bite He is now on antibiotics and felt much better No other new bone pain.  We discussed test results and future follow-up  REVIEW OF SYSTEMS:   Constitutional: Denies fevers, chills or abnormal weight loss Eyes: Denies blurriness of vision Ears, nose, mouth, throat, and face: Denies mucositis or sore throat Respiratory: Denies cough, dyspnea or wheezes Cardiovascular: Denies palpitation, chest discomfort or lower extremity swelling Gastrointestinal:  Denies nausea, heartburn or change in bowel habits Skin: Denies abnormal skin rashes Lymphatics: Denies  new lymphadenopathy or easy bruising Neurological:Denies numbness, tingling or new weaknesses Behavioral/Psych: Mood is stable, no new changes  All other systems were reviewed with the patient and are negative.  I have reviewed the past medical history, past surgical history, social history and family history with the patient and they are unchanged from previous note.  ALLERGIES:  is allergic to cortisone.  MEDICATIONS:  Current Outpatient Medications  Medication Sig Dispense Refill   ezetimibe (ZETIA) 10 MG tablet Take 1 tablet by mouth daily.     amoxicillin-clavulanate (AUGMENTIN) 875-125 MG tablet Take 1 tablet by mouth 2 (two) times daily. 14 tablet 0   aspirin 81 MG tablet Take 81 mg by mouth daily.     atorvastatin (LIPITOR) 20 MG tablet Take 20 mg by mouth daily.     Cholecalciferol (VITAMIN D PO) Take 2,000 Units by mouth daily.     gabapentin (NEURONTIN) 300 MG capsule Take 300 mg by mouth daily as needed.     HYDROcodone-acetaminophen (NORCO) 7.5-325 MG tablet Take 1 tablet by mouth 2 (two) times daily as needed for moderate pain. 20 tablet 0   isosorbide mononitrate (IMDUR) 30 MG 24 hr tablet TAKE 1/2 TABLET DAILY 45 tablet 2   metoprolol succinate (TOPROL-XL) 25 MG 24 hr tablet Take 12.5 mg by mouth daily.     Misc Natural Products (GLUCOSAMINE CHOND COMPLEX/MSM) TABS take 1 po qd     Multiple Vitamins-Minerals (ZINC PO) Take by mouth daily.     nitroGLYCERIN (NITROSTAT) 0.4 MG SL tablet Place 1 tablet (0.4 mg total) under the tongue every 5 (five) minutes as needed for chest pain. 90 tablet 3   pregabalin (LYRICA) 75 MG capsule  as needed.     SYNTHROID 75 MCG tablet Take 75 mcg by mouth daily before breakfast.      No current facility-administered medications for this visit.    SUMMARY OF ONCOLOGIC HISTORY:  This is a pleasant gentleman who was found to have anemia and abnormal M spike. He had further workup including blood work, urine test, skeletal survey as well as  bone marrow aspirate and biopsy. 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 He also has abnormal lung nodule, observed with serial imaging CT scan. Last CT scan of the chest dated June 2016, confirmed benign lung nodules with no further follow-up recommended Due to abnormal weight loss, he underwent CT imaging of the chest, abdomen and pelvis on August 21, 2020 which showed progression of pulmonary fibrosis and diverticulosis of the colon PHYSICAL EXAMINATION: ECOG PERFORMANCE STATUS: 0 - Asymptomatic  Vitals:   10/31/23 1251  BP: (!) 122/59  Pulse: 84  Resp: 17  Temp: 99.2 F (37.3 C)  SpO2: 95%   Filed Weights   10/31/23 1251  Weight: 150 lb 9.6 oz (68.3 kg)    GENERAL:alert, no distress and comfortable LABORATORY DATA:  I have reviewed the data as listed    Component Value Date/Time   NA 138 10/28/2023 1228   NA 135 (L) 03/23/2017 1316   K 3.9 10/28/2023 1228   K 4.5 03/23/2017 1316   CL 100 10/28/2023 1228   CL 94 (L) 03/20/2013 1510   CO2 30 10/28/2023 1228   CO2 26 03/23/2017 1316   GLUCOSE 150 (H) 10/28/2023 1228   GLUCOSE 103 03/23/2017 1316   GLUCOSE 122 (H) 03/20/2013 1510   BUN 18 10/28/2023 1228   BUN 17.8 03/23/2017 1316   CREATININE 1.08 10/28/2023 1228   CREATININE 1.11 10/21/2022 1305   CREATININE 1.3 03/23/2017 1316   CALCIUM 8.6 (L) 10/28/2023 1228   CALCIUM 9.6 03/23/2017 1316   PROT 6.7 10/28/2023 1228   PROT 6.5 03/23/2017 1317   PROT 6.8 03/23/2017 1316   ALBUMIN 3.8 10/28/2023 1228   ALBUMIN 3.8 03/23/2017 1316   AST 15 10/28/2023 1228   AST 21 10/21/2022 1305   AST 29 03/23/2017 1316   ALT 15 10/28/2023 1228   ALT 25 10/21/2022 1305   ALT 28 03/23/2017 1316   ALKPHOS 51 10/28/2023 1228   ALKPHOS 59 03/23/2017 1316   BILITOT 0.6 10/28/2023 1228   BILITOT 0.7 10/21/2022 1305   BILITOT 1.00 03/23/2017 1316   GFRNONAA >60 10/28/2023 1228   GFRNONAA >60 10/21/2022 1305   GFRAA >60 03/23/2020 1333    No results found for:  "SPEP", "UPEP"  Lab Results  Component Value Date   WBC 9.3 10/28/2023   NEUTROABS 7.1 10/28/2023   HGB 10.4 (L) 10/28/2023   HCT 31.1 (L) 10/28/2023   MCV 92.6 10/28/2023   PLT 242 10/28/2023      Chemistry      Component Value Date/Time   NA 138 10/28/2023 1228   NA 135 (L) 03/23/2017 1316   K 3.9 10/28/2023 1228   K 4.5 03/23/2017 1316   CL 100 10/28/2023 1228   CL 94 (L) 03/20/2013 1510   CO2 30 10/28/2023 1228   CO2 26 03/23/2017 1316   BUN 18 10/28/2023 1228   BUN 17.8 03/23/2017 1316   CREATININE 1.08 10/28/2023 1228   CREATININE 1.11 10/21/2022 1305   CREATININE 1.3 03/23/2017 1316      Component Value Date/Time   CALCIUM 8.6 (L) 10/28/2023 1228  CALCIUM 9.6 03/23/2017 1316   ALKPHOS 51 10/28/2023 1228   ALKPHOS 59 03/23/2017 1316   AST 15 10/28/2023 1228   AST 21 10/21/2022 1305   AST 29 03/23/2017 1316   ALT 15 10/28/2023 1228   ALT 25 10/21/2022 1305   ALT 28 03/23/2017 1316   BILITOT 0.6 10/28/2023 1228   BILITOT 0.7 10/21/2022 1305   BILITOT 1.00 03/23/2017 1316       RADIOGRAPHIC STUDIES: I have personally reviewed the radiological images as listed and agreed with the findings in the report. DG Chest 2 View Result Date: 10/28/2023 CLINICAL DATA:  Right hand infection. EXAM: CHEST - 2 VIEW COMPARISON:  10/19/2021 FINDINGS: The heart size and mediastinal contours are within normal limits. Prior CABG. Stable bibasilar scarring. The lungs are otherwise clear. Several old right rib fracture deformities are again noted. IMPRESSION: No active cardiopulmonary disease. Electronically Signed   By: Danae Orleans M.D.   On: 10/28/2023 13:39

## 2023-11-04 HISTORY — PX: BACK SURGERY: SHX140

## 2023-11-09 NOTE — H&P (Addendum)
 Chief Complaint Lumbar stenosis with neurogenic claudication  History Of Present Illness Mitchell Fields is a 88 y.o. male presenting with lumbar stenosis   Mitchell Fields is a very nice 88 year old man who comes to clinic today for neurosurgical consultation.  He was evaluated by Dr. Rockey Peru on September 11, 2023.  Dr. Shelagh notes are detailed.  He recommended an EMG and follow up.  Mitchell Fields reports that the EMG was not scheduled.  The patient's cardiologist is Dr. Jordan in [place].  Mitchell Fields has a remote history of L4 through S1 decompression in the '80s and '90s.  The patient reports continued symptoms today with right hip pain and sensory changes in the legs.  He describes tingling in the lower extremities and leg fatigue with ambulation.  Mitchell Fields complains of poor balance.  He also complains of a right foot drop and weakness with right hip flexion.  Fortunately, the patient denies saddle numbness and bowel and bladder dysfunction.  He has not recently undergone physical therapy.  Past Medical History He has a past medical history of At moderate risk for fall, Chronic pain disorder, COPD (chronic obstructive pulmonary disease) (CMD), Coronary artery disease, Dyslipidemia, History of transfusion (1995), Hypothyroidism, Myocardial infarction (CMD) (1995), On prednisone therapy, and Peripheral neuropathy.  Positive for coronary artery disease, peripheral neuropathy, hypertension, hyperlipidemia, pulmonary hypertension, diverticulitis, and adrenal insufficiency.  Surgical History He has a past surgical history that includes Coronary artery bypass graft (1995); Hernia repair (Left); Carpal tunnel release (Bilateral); Back surgery; and Cataract extraction w/  intraocular lens implant (Bilateral).   Family History His family history is not on file.  Social History He reports that he quit smoking about 46 years ago. His smoking use included cigarettes. He started smoking about  51 years ago. He has a 10 pack-year smoking history. He has never used smokeless tobacco. He reports current alcohol use of about 2.0 standard drinks of alcohol per week. He reports that he does not use drugs.   Allergies Patient has no known allergies.  Medications No medications prior to admission.   Include baby aspirin, atorvastatin, ezetimibe, metoprolol, pantoprazole , prednisone, Synthroid, and occasional Aleve, ibuprofen, and Lortab 5.  Mitchell Fields takes Lortab 5 approximately 4 times a week.  Review of Systems  As above Physical Exam  [name] appears well for his age and is in no distress.  He has a mild Romberg sign.  The patient's back is nontender.  The patient shows a prominent right foot drop and poor toe lift bilaterally.  He lifts the right lower extremity when ambulating to avoid tripping.  The patient denies numbness to light touch. CTA, RRR  Last Recorded Vitals Height 1.753 m (5' 9), weight 65.3 kg (144 lb).  Relevant Results A lumbar MRI scan was obtained on August 24, 2023.  There is a large right eccentric L1-2 disc herniation with rostral extrusion.  The patient has significant stenosis at L1-2 and L2-3.  The canal at L4-5 is obliterated.   Assessment/Plan Principal Problem:   Lumbar stenosis with neurogenic claudication Active Problems:   At moderate risk for fall   Chronic pain disorder   COPD (chronic obstructive pulmonary disease) (CMD)   Coronary artery disease   Dyslipidemia   Hypothyroidism   Peripheral neuropathy  [name] is a pleasant 88 year old man with a prolonged history of lower back and leg symptoms.  These are consistent with neurogenic claudication and polyradiculopathy.  On examination, he has evidence of prominent radiculopathies with  weakness.  The recent lumbar MRI scan shows severe stenosis as described.  I believe Mitchell Fields is in need of definitive intervention in the form of open L1 through 5 decompressions.  I do not  believe fusion is indicated.  He will need careful preoperative medical and cardiac clearance.  The surgery including its risks and benefits were discussed at length with the patient and his wife.  Benefits of the surgery discussed included decompression of the neural elements, improvement in symptoms, and prevention of neurological decline.  Risks of the surgery discussed included bleeding, infection, durotomy, CSF leak, nerve damage, failure to improve symptoms, and need for additional surgery.  He verbalized understanding and wishes to proceed.  I would like to refer Mitchell Fields to an orthotist for AFO bracing.  I asked the patient to stay active with walking and to call me for any problems, questions, or change in symptoms.   Cardiac clearance: [facility] Glendia Ferrier PA-C 10/30/2023 : moderate cardiac risk. clearance

## 2023-12-18 ENCOUNTER — Telehealth: Payer: Self-pay | Admitting: Cardiology

## 2023-12-18 MED ORDER — ISOSORBIDE MONONITRATE ER 30 MG PO TB24: 15.0000 mg | ORAL_TABLET | Freq: Every day | ORAL | 3 refills | Status: AC

## 2023-12-18 NOTE — Telephone Encounter (Signed)
 Spoke with pt, new refill sent to mail order.

## 2023-12-18 NOTE — Telephone Encounter (Signed)
 Pt c/o medication issue:  1. Name of Medication: isosorbide mononitrate (IMDUR) 30 MG 24 hr tablet   2. How are you currently taking this medication (dosage and times per day)? TAKE 1/2 TABLET DAILY   3. Are you having a reaction (difficulty breathing--STAT)?No  4. What is your medication issue? Patient is requesting to speak with a nurse in regard to changing the tartrate amount in the medication. Patient had questions about changing his pharmacy to mail order. Please advise.

## 2023-12-28 ENCOUNTER — Ambulatory Visit: Attending: Internal Medicine | Admitting: Physical Therapy

## 2023-12-28 ENCOUNTER — Other Ambulatory Visit: Payer: Self-pay

## 2023-12-28 ENCOUNTER — Encounter: Payer: Self-pay | Admitting: Physical Therapy

## 2023-12-28 DIAGNOSIS — M6281 Muscle weakness (generalized): Secondary | ICD-10-CM | POA: Diagnosis present

## 2023-12-28 DIAGNOSIS — R2689 Other abnormalities of gait and mobility: Secondary | ICD-10-CM | POA: Diagnosis present

## 2023-12-28 DIAGNOSIS — R2681 Unsteadiness on feet: Secondary | ICD-10-CM | POA: Insufficient documentation

## 2023-12-28 NOTE — Therapy (Signed)
 OUTPATIENT PHYSICAL THERAPY NEURO EVALUATION   Patient Name: Mitchell Fields MRN: [id number] DOB:08-Oct-1935, 88 y.o., male Today's Date: 12/29/2023   PCP: Cleatis Polka., MD REFERRING PROVIDER: Cleatis Polka., MD  END OF SESSION:  PT End of Session - 12/29/23 1234     Visit Number 1    Number of Visits 13    Date for PT Re-Evaluation 02/23/24    Authorization Type [employer] Medicare    Authorization Time Period auth submitted    Progress Note Due on Visit 10    PT Start Time 1450    PT Stop Time 1530    PT Time Calculation (min) 40 min    Equipment Utilized During Treatment Gait belt    Activity Tolerance Patient tolerated treatment well    Behavior During Therapy WFL for tasks assessed/performed             Past Medical History:  Diagnosis Date   Anemia    Arthritis    Back pain    Bursitis of left hip    Coronary artery disease    TWO VESSEL BYPASS SURGERY in January 1995   Diverticulosis    Hyperlipidemia    Hypertension    Monoclonal gammopathy    Neuropathy    Past Surgical History:  Procedure Laterality Date   ADENOIDECTOMY     BACK SURGERY  1985, 1995, 2002   X3   COLONOSCOPY     CORONARY ARTERY BYPASS GRAFT     X2. LIMA GRAFT AND A RIMA GRAFT.   INGUINAL HERNIA REPAIR Left    Negative stress echo  08/11/2009   US ECHOCARDIOGRAPHY  June 2012   Normal EF, mild aortic regurgitation, mild MR, LAE, mildly dilated RV, RAE, increased left atrial pressure, moderate to severe TR and mildly increased  PA systolic pressure.    Patient Active Problem List   Diagnosis Date Noted   Trochanteric bursitis, right hip 08/23/2021   CKD (chronic kidney disease), stage III (HCC) 04/01/2021   Positive anti-CCP test 11/24/2020   Hand deformity 11/24/2020   Pulmonary fibrosis (HCC) 08/24/2020   Diverticulosis of colon 08/24/2020   Melena 08/14/2020   Cough in adult 08/14/2020   Left rotator cuff tear arthropathy 09/16/2019   Other fatigue 03/06/2019    Alcoholic peripheral neuropathy (HCC) 03/12/2018   Sciatica, right side 10/11/2017   Acute pain of right shoulder 03/02/2017   Impingement syndrome of right shoulder 03/02/2017   Anemia due to chronic illness 03/13/2014   Monoclonal gammopathy    Lung nodules 03/11/2013   Weight loss, non-intentional 01/28/2013   CAD (coronary artery disease) 08/03/2011   HTN (hypertension) 08/03/2011   Hyperlipidemia 08/03/2011   Valvular heart disease 08/03/2011    ONSET DATE: 12/27/2023 (MD referral)  REFERRING DIAG:  M21.371 (ICD-10-CM) - Foot drop, right foot   THERAPY DIAG:  Muscle weakness (generalized)  Unsteadiness on feet  Other abnormalities of gait and mobility  Rationale for Evaluation and Treatment: Rehabilitation  SUBJECTIVE:  SUBJECTIVE STATEMENT: Had back surgery February 7th to repair lumbar discs; had R foot drop before the surgery and was causing me to fall.  No falls since surgery. Pt accompanied by: self  PERTINENT HISTORY: Lumbar radiculopathy status post lumbar surgery, foot drop (11/2023); COPD, CAD, hypothyroidism, peripheral neuropathy  PAIN:  Are you having pain? Yes: NPRS scale: 4-5/10 Pain location: low back Pain description: sore, weakne Aggravating factors: walking, standing or bending over Relieving factors: sitting, lying down  PRECAUTIONS: Fall and Other: back precautions lifted 12/26/2023  RED FLAGS: None   WEIGHT BEARING RESTRICTIONS: No  FALLS: Has patient fallen in last 6 months? Yes. Number of falls 8 (prior to surgery)  LIVING ENVIRONMENT: Lives with: lives with their spouse Lives in: House/apartment Stairs:  3 steps to enter Has following equipment at home: Single point cane and R AFO  PLOF: Independent and Leisure: enjoys going to weekly exercises at  [employer]   PATIENT GOALS: Pt's goals for PT are to help to walk and get better.  OBJECTIVE:  Note: Objective measures were completed at Evaluation unless otherwise noted.  DIAGNOSTIC FINDINGS: MRI Lumbar spine, 08/2023:  IMPRESSION: 1. Mild interval enlargement of a chronic L1-2 disc extrusion with severe spinal and right lateral recess stenosis. 2. Unchanged severe spinal stenosis at L2-3 and L4-5. 3. Unchanged multilevel neural foraminal stenosis, most severe on the right at L3-4 and L4-5.    COGNITION: Overall cognitive status: Within functional limits for tasks assessed   SENSATION: Light touch: Impaired  and does reports some tingling due to neuropathy  COORDINATION: Decreased RLE  POSTURE: rounded shoulders  LOWER EXTREMITY ROM:   passive R ankle dorsiflexion to neutral  Active  Right Eval Left Eval  Hip flexion    Hip extension    Hip abduction    Hip adduction    Hip internal rotation    Hip external rotation    Knee flexion    Knee extension    Ankle dorsiflexion No active WFL  Ankle plantarflexion    Ankle inversion    Ankle eversion WFL    (Blank rows = not tested)  LOWER EXTREMITY MMT:    MMT Right Eval Left Eval  Hip flexion 3+ 4  Hip extension    Hip abduction    Hip adduction    Hip internal rotation    Hip external rotation    Knee flexion 4 5  Knee extension 5 5  Ankle dorsiflexion 1 3+  Ankle plantarflexion    Ankle inversion    Ankle eversion    (Blank rows = not tested)   TRANSFERS: Assistive device utilized: None , requires UE support Sit to stand: Modified independence Stand to sit: Modified independence  GAIT: Gait pattern: step through pattern, decreased step length- Right, decreased step length- Left, decreased stance time- Right, decreased ankle dorsiflexion- Right, and Right steppage Distance walked: 50 ft x 2 Assistive device utilized: Single point cane Level of assistance: SBA and CGA Comments: decreased foot  clearance at times with RLE; has AFO, but did not wear to PT session today  FUNCTIONAL TESTS:  Timed up and go (TUG): 21.4 sec 10 meter walk test: 13.84 sec (2.37 ft/sec)  TREATMENT DATE: 12/28/2023    PATIENT EDUCATION: Education details: PT eval results, POC, initiated HEP Person educated: Patient Education method: Explanation, Demonstration, and Handouts Education comprehension: verbalized understanding, returned demonstration, and needs further education  HOME EXERCISE PROGRAM: Access Code: [email or web address] URL: [email or web address] Date: 12/28/2023 Prepared by: [facility] - Outpatient  Rehab - [facility]  Exercises - Seated Hamstring Stretch with Chair  - 1 x daily - 7 x weekly - 1 sets - 3 reps - 15-30 sec hold - Standing Gastroc Stretch at Counter  - 1-2 x daily - 7 x weekly - 1 sets - 3 reps - 15-30 sec hold - Sit to stand in stride stance  - 1-2 x daily - 7 x weekly - 3 sets - 5 reps  GOALS: Goals reviewed with patient? Yes  SHORT TERM GOALS: Target date: 01/26/2024  Pt will be independent with HEP for improved strength, flexibility, balance, gait. Baseline: Goal status: INITIAL  2.  Pt will demo improved flexibility in R ankle dorsiflexion by 10 degrees for improved gait and step negotiation. Baseline: neutral (passive) Goal status: INITIAL  3.  Pt will improve TUG score to less than or equal to 15 sec for decreased fall risk.  Baseline: 21 sec Goal status: INITIAL   LONG TERM GOALS: Target date: 02/23/24  Pt will be independent with progression of HEP for improved balance, flexibility, strength, gait. Baseline:  Goal status: INITIAL  2.  Pt will improve gait velocity to at least 2.62 ft/sec for improved gait efficiency and safety. Baseline: 2.37 ft/sec Goal status: INITIAL  3.  FTSTS score to be tested, with pt to  perform without UE support in <15 sec. Baseline: TBA Goal status: INITIAL  4.  Pt will negotiate curb, ramp, outdoor surfaces with appropriate device and no LOB for safety with community gait. Baseline:  Goal status: INITIAL   ASSESSMENT:  CLINICAL IMPRESSION: Patient is an 88 y.o. male who was seen today for physical therapy evaluation and treatment for R foot drop.  He had recent lumbar surgery, with R foot drop present prior to surgery.  He had multiple falls in the previous 6 months, but no falls since surgery (early Feb 2025).  He presents to OPPT with decreased flexibility, decreased strength, decreased balance, decreased gait.  He is at increased fall risk due to TUG score, with limited community ambulator gait velocity.  He has an AFO, but has been unable to use due to a healing wound on his shin.  He will benefit from skilled PT to address the above stated deficits for decreased fall risk and improved overall return to independent functional mobility.   OBJECTIVE IMPAIRMENTS: Abnormal gait, decreased balance, decreased mobility, difficulty walking, decreased ROM, decreased strength, impaired flexibility, and postural dysfunction.   ACTIVITY LIMITATIONS: bending, standing, squatting, transfers, and locomotion level  PARTICIPATION LIMITATIONS: shopping, community activity, and yard work  PERSONAL FACTORS: 3+ comorbidities: see above  are also affecting patient's functional outcome.   REHAB POTENTIAL: Good  CLINICAL DECISION MAKING: Stable/uncomplicated  EVALUATION COMPLEXITY: Low  PLAN:  PT FREQUENCY: 2x/week for 4 weeks, then 1x/wk for 4 weeks  PT DURATION: other: 9 weeks, including eval session  PLANNED INTERVENTIONS: 97110-Therapeutic exercises, 97530- Therapeutic activity, [id number]- Neuromuscular re-education, 97535- Self Care, [id number]- Manual therapy, [id number]- Gait training, [id number]- Orthotic Fit/training, Balance training, and DME instructions  PLAN FOR NEXT SESSION: Review  initial HEP and progress for flexibility, strengthening, balance.  Trial foot-up brace or try pt's AFO (he may bring in) Pt  lives in [place] (check with) [place] location to see if it would work for him to be seen there.  Gean Maidens., PT 12/29/2023, 12:36 PM  [facility] at [facility] [address] [place], [address] [place], [place] [address] Phone # [phone] Fax # [phone]

## 2024-01-02 ENCOUNTER — Ambulatory Visit: Attending: Internal Medicine | Admitting: Physical Therapy

## 2024-01-02 ENCOUNTER — Encounter: Payer: Self-pay | Admitting: Physical Therapy

## 2024-01-02 DIAGNOSIS — R2681 Unsteadiness on feet: Secondary | ICD-10-CM | POA: Insufficient documentation

## 2024-01-02 DIAGNOSIS — R2689 Other abnormalities of gait and mobility: Secondary | ICD-10-CM | POA: Insufficient documentation

## 2024-01-02 DIAGNOSIS — M6281 Muscle weakness (generalized): Secondary | ICD-10-CM | POA: Insufficient documentation

## 2024-01-02 NOTE — Therapy (Signed)
 OUTPATIENT PHYSICAL THERAPY NEURO TREATMENT NOTE   Patient Name: Mitchell Fields MRN: [id number] DOB:06-25-1936, 88 y.o., male Today's Date: 01/02/2024   PCP: Cleatis Polka., MD REFERRING PROVIDER: Cleatis Polka., MD  END OF SESSION:  PT End of Session - 01/02/24 1703     Visit Number 2    Number of Visits 13    Date for PT Re-Evaluation 02/23/24    Authorization Type [employer]    Authorization Time Period auth submitted    Progress Note Due on Visit 10    PT Start Time 1620    PT Stop Time 1700    PT Time Calculation (min) 40 min    Equipment Utilized During Treatment Gait belt    Activity Tolerance Patient tolerated treatment well    Behavior During Therapy WFL for tasks assessed/performed              Past Medical History:  Diagnosis Date   Anemia    Arthritis    Back pain    Bursitis of left hip    Coronary artery disease    TWO VESSEL BYPASS SURGERY in January 1995   Diverticulosis    Hyperlipidemia    Hypertension    Monoclonal gammopathy    Neuropathy    Past Surgical History:  Procedure Laterality Date   ADENOIDECTOMY     BACK SURGERY  1985, 1995, 2002   X3   COLONOSCOPY     CORONARY ARTERY BYPASS GRAFT     X2. LIMA GRAFT AND A RIMA GRAFT.   INGUINAL HERNIA REPAIR Left    Negative stress echo  08/11/2009   US ECHOCARDIOGRAPHY  June 2012   Normal EF, mild aortic regurgitation, mild MR, LAE, mildly dilated RV, RAE, increased left atrial pressure, moderate to severe TR and mildly increased  PA systolic pressure.    Patient Active Problem List   Diagnosis Date Noted   Trochanteric bursitis, right hip 08/23/2021   CKD (chronic kidney disease), stage III (HCC) 04/01/2021   Positive anti-CCP test 11/24/2020   Hand deformity 11/24/2020   Pulmonary fibrosis (HCC) 08/24/2020   Diverticulosis of colon 08/24/2020   Melena 08/14/2020   Cough in adult 08/14/2020   Left rotator cuff tear arthropathy 09/16/2019   Other fatigue 03/06/2019    Alcoholic peripheral neuropathy (HCC) 03/12/2018   Sciatica, right side 10/11/2017   Acute pain of right shoulder 03/02/2017   Impingement syndrome of right shoulder 03/02/2017   Anemia due to chronic illness 03/13/2014   Monoclonal gammopathy    Lung nodules 03/11/2013   Weight loss, non-intentional 01/28/2013   CAD (coronary artery disease) 08/03/2011   HTN (hypertension) 08/03/2011   Hyperlipidemia 08/03/2011   Valvular heart disease 08/03/2011    ONSET DATE: 12/27/2023 (MD referral)  REFERRING DIAG:  M21.371 (ICD-10-CM) - Foot drop, right foot   THERAPY DIAG:  Muscle weakness (generalized)  Unsteadiness on feet  Other abnormalities of gait and mobility  Rationale for Evaluation and Treatment: Rehabilitation  SUBJECTIVE:  SUBJECTIVE STATEMENT: My balance is terrible.  Have pain in the sides of my low back.  Was like that before surgery, but just worse since the surgery. Pt accompanied by: self  PERTINENT HISTORY: Lumbar radiculopathy status post lumbar surgery, foot drop (11/2023); COPD, CAD, hypothyroidism, peripheral neuropathy  PAIN:  Are you having pain? Yes: NPRS scale: 6-7/10 Pain location: low back Pain description: sore, weak Aggravating factors: walking, standing or bending over Relieving factors: sitting, lying down  PRECAUTIONS: Fall and Other: back precautions lifted 12/26/2023  RED FLAGS: None   WEIGHT BEARING RESTRICTIONS: No  FALLS: Has patient fallen in last 6 months? Yes. Number of falls 8 (prior to surgery)  LIVING ENVIRONMENT: Lives with: lives with their spouse Lives in: House/apartment Stairs:  3 steps to enter Has following equipment at home: Single point cane and R AFO  PLOF: Independent and Leisure: enjoys going to weekly exercises at [employer]    PATIENT GOALS: Pt's goals for PT are to help to walk and get better.  OBJECTIVE:   Previous back exercises:  pt has handouts from [facility] following previous back surgeries. *PT requests pt bring these in to see if they are still appropriate to address his back pain*  TODAY'S TREATMENT: 01/02/2024 Activity Comments  Review of HEP-cues to slow pace with sit<>stand Cues for technique with hamstring stretch, gastroc stretch, to avoid excess stretch/pain with stretching; discussed using a lower block/footstool and avoid forward lean, in order to just feel a gentle stretch  Sit to stand from 20" mat, light UE support, 5 reps Cues for slowed pace eccentric control to sit  Stagger stance forward/back rocking, 2 x 5 reps RLE in forward position, with cues forward weightshift through hips and for terminal knee extension in full weightbearing  RLE as stance with LLE step taps to 6" treadmill step with BUE support   Berg Balance:  22/56 Scores <45/56 indicate increased fall risk  Forward/back walking in parallel bars, 3 reps Cues for full toe>heel in backwards direction; pt very fast in performing  Seated R ankle heel raises>foot flat; 10 reps Cues for slowed pace, eccentric control       [facility] PT Assessment - 01/02/24 1643       Standardized Balance Assessment   Standardized Balance Assessment Berg Balance Test      Berg Balance Test   Sit to Stand Able to stand  independently using hands    Standing Unsupported Able to stand 2 minutes with supervision    Sitting with Back Unsupported but Feet Supported on Floor or Stool Able to sit safely and securely 2 minutes    Stand to Sit Controls descent by using hands    Transfers Able to transfer safely, definite need of hands    Standing Unsupported with Eyes Closed Able to stand 10 seconds with supervision    Standing Unsupported with Feet Together Able to place feet together independently but unable to hold for 30 seconds    From Standing, Reach  Forward with Outstretched Arm Loses balance while trying/requires external support   back pain limiting pt and needs a seat   From Standing Position, Pick up Object from Floor Unable to try/needs assist to keep balance    From Standing Position, Turn to Look Behind Over each Shoulder Needs assist to keep from losing balance and falling    Turn 360 Degrees Needs close supervision or verbal cueing    Standing Unsupported, Alternately Place Feet on Step/Stool Needs assistance to keep from falling  or unable to try    Standing Unsupported, One Foot in [employer] balance while stepping or standing    Standing on One Leg Unable to try or needs assist to prevent fall    Total Score 22    Berg comment: Scores <45/56 indicate increased fall risk            Access Code: [email or web address] URL: [email or web address] Date: 01/02/2024 Prepared by: [facility] - Outpatient  Rehab - [facility]  Exercises - Seated Hamstring Stretch with Chair  - 1 x daily - 7 x weekly - 1 sets - 3 reps - 15-30 sec hold - Standing Gastroc Stretch at Counter  - 1-2 x daily - 7 x weekly - 1 sets - 3 reps - 15-30 sec hold - Sit to stand in stride stance  - 1-2 x daily - 7 x weekly - 3 sets - 5 reps - Seated Heel Raise  - 1 x daily - 7 x weekly - 3 sets - 10 reps - 3 sec hold   PATIENT EDUCATION: Education details: HEP updates, cues for slowed technique with sit<>stand and for correct technique for stretches.  Discussed fall risk per Berg score and need to at least use cane; pt states he only uses cane outdoors (but did not bring into session).  Discussed options for foot-up brace to assist with gait pattern, but pt doesn't want to try another brace Person educated: Patient Education method: Explanation, Demonstration, and Handouts Education comprehension: verbalized understanding, returned demonstration, and needs further education ------------------------------------- Note: Objective measures were completed at  Evaluation unless otherwise noted.  DIAGNOSTIC FINDINGS: MRI Lumbar spine, 08/2023:  IMPRESSION: 1. Mild interval enlargement of a chronic L1-2 disc extrusion with severe spinal and right lateral recess stenosis. 2. Unchanged severe spinal stenosis at L2-3 and L4-5. 3. Unchanged multilevel neural foraminal stenosis, most severe on the right at L3-4 and L4-5.    COGNITION: Overall cognitive status: Within functional limits for tasks assessed   SENSATION: Light touch: Impaired  and does reports some tingling due to neuropathy  COORDINATION: Decreased RLE  POSTURE: rounded shoulders  LOWER EXTREMITY ROM:   passive R ankle dorsiflexion to neutral  Active  Right Eval Left Eval  Hip flexion    Hip extension    Hip abduction    Hip adduction    Hip internal rotation    Hip external rotation    Knee flexion    Knee extension    Ankle dorsiflexion No active WFL  Ankle plantarflexion    Ankle inversion    Ankle eversion WFL    (Blank rows = not tested)  LOWER EXTREMITY MMT:    MMT Right Eval Left Eval  Hip flexion 3+ 4  Hip extension    Hip abduction    Hip adduction    Hip internal rotation    Hip external rotation    Knee flexion 4 5  Knee extension 5 5  Ankle dorsiflexion 1 3+  Ankle plantarflexion    Ankle inversion    Ankle eversion    (Blank rows = not tested)   TRANSFERS: Assistive device utilized: None , requires UE support Sit to stand: Modified independence Stand to sit: Modified independence  GAIT: Gait pattern: step through pattern, decreased step length- Right, decreased step length- Left, decreased stance time- Right, decreased ankle dorsiflexion- Right, and Right steppage Distance walked: 50 ft x 2 Assistive device utilized: Single point cane Level of assistance: SBA and CGA Comments: decreased foot clearance  at times with RLE; has AFO, but did not wear to PT session today  FUNCTIONAL TESTS:  Timed up and go (TUG): 21.4 sec 10 meter walk  test: 13.84 sec (2.37 ft/sec)                                                                                                                              TREATMENT DATE: 12/28/2023    PATIENT EDUCATION: Education details: PT eval results, POC, initiated HEP Person educated: Patient Education method: Explanation, Demonstration, and Handouts Education comprehension: verbalized understanding, returned demonstration, and needs further education  HOME EXERCISE PROGRAM: Access Code: [email or web address] URL: [email or web address] Date: 12/28/2023 Prepared by: [facility] - Outpatient  Rehab - [facility]  Exercises - Seated Hamstring Stretch with Chair  - 1 x daily - 7 x weekly - 1 sets - 3 reps - 15-30 sec hold - Standing Gastroc Stretch at Counter  - 1-2 x daily - 7 x weekly - 1 sets - 3 reps - 15-30 sec hold - Sit to stand in stride stance  - 1-2 x daily - 7 x weekly - 3 sets - 5 reps  GOALS: Goals reviewed with patient? Yes  SHORT TERM GOALS: Target date: 01/26/2024  Pt will be independent with HEP for improved strength, flexibility, balance, gait. Baseline: Goal status: INITIAL  2.  Pt will demo improved flexibility in R ankle dorsiflexion by 10 degrees for improved gait and step negotiation. Baseline: neutral (passive) Goal status: INITIAL  3.  Pt will improve TUG score to less than or equal to 15 sec for decreased fall risk.  Baseline: 21 sec Goal status: INITIAL   LONG TERM GOALS: Target date: 02/23/24  Pt will be independent with progression of HEP for improved balance, flexibility, strength, gait. Baseline:  Goal status: INITIAL  2.  Pt will improve gait velocity to at least 2.62 ft/sec for improved gait efficiency and safety. Baseline: 2.37 ft/sec Goal status: INITIAL  3.  FTSTS score to be tested, with pt to perform without UE support in <15 sec. Baseline: pt requires UE support Goal status: INITIAL  4.  Pt will negotiate curb, ramp, outdoor surfaces  with appropriate device and no LOB for safety with community gait. Baseline:  Goal status: INITIAL   ASSESSMENT:  CLINICAL IMPRESSION: Pt presents today without cane and with reports of no falls, but "balance is terrible". Skilled PT session focused on assessing Berg score, with score of 22/56 indicating increased fall risk and need for use of assistive device (pt does not have any device with him at PT session today).  Also worked on reviewed HEP, with pt needing cues for correct technique and to avoid overstretching into pain.  Worked on ways to incorporate weightbearing and eccentric phases of movement to engage forward progression of tibia over foot and ant tib activation.  Pt needs cues throughout for slowed pace and for rationale of exercises. Pt will continue to benefit from skilled  PT towards goals for improved functional mobility and decreased fall risk.   OBJECTIVE IMPAIRMENTS: Abnormal gait, decreased balance, decreased mobility, difficulty walking, decreased ROM, decreased strength, impaired flexibility, and postural dysfunction.   ACTIVITY LIMITATIONS: bending, standing, squatting, transfers, and locomotion level  PARTICIPATION LIMITATIONS: shopping, community activity, and yard work  PERSONAL FACTORS: 3+ comorbidities: see above  are also affecting patient's functional outcome.   REHAB POTENTIAL: Good  CLINICAL DECISION MAKING: Stable/uncomplicated  EVALUATION COMPLEXITY: Low  PLAN:  PT FREQUENCY: 2x/week for 4 weeks, then 1x/wk for 4 weeks  PT DURATION: other: 9 weeks, including eval session  PLANNED INTERVENTIONS: 97110-Therapeutic exercises, 97530- Therapeutic activity, 97112- Neuromuscular re-education, 97535- Self Care, [id number]- Manual therapy, [id number]- Gait training, [phone]- Orthotic Fit/training, Balance training, and DME instructions  PLAN FOR NEXT SESSION: Review updates to HEP and progress for flexibility, strengthening, balance.  Trial foot-up brace or try pt's AFO  (he may bring in).  Gait with cane and foot up to help with gait pattern and balance.  HEP to include RLE WB with LLE step taps, stagger stance weightshifting Pt lives in [place] (check with) [place] location to see if it would work for him to be seen there.  Gean Maidens., PT 01/02/2024, 5:03 PM  [facility] at [facility] [address] [place], [address] [place], [place] [address] Phone # [phone] Fax # [phone]

## 2024-01-04 ENCOUNTER — Ambulatory Visit: Admitting: Physical Therapy

## 2024-01-04 ENCOUNTER — Encounter: Payer: Self-pay | Admitting: Physical Therapy

## 2024-01-04 DIAGNOSIS — R2689 Other abnormalities of gait and mobility: Secondary | ICD-10-CM

## 2024-01-04 DIAGNOSIS — M6281 Muscle weakness (generalized): Secondary | ICD-10-CM | POA: Diagnosis not present

## 2024-01-04 DIAGNOSIS — R2681 Unsteadiness on feet: Secondary | ICD-10-CM

## 2024-01-04 NOTE — Therapy (Signed)
 OUTPATIENT PHYSICAL THERAPY NEURO TREATMENT NOTE   Patient Name: Mitchell Fields MRN: [id number] DOB:[birth year], 88 y.o., male Today's Date: 01/04/2024   PCP: Cleatis Polka., MD REFERRING PROVIDER: Cleatis Polka., MD  END OF SESSION:  PT End of Session - 01/04/24 1353     Visit Number 3    Number of Visits 13    Date for PT Re-Evaluation 02/23/24    Authorization Type [employer]    Authorization Time Period auth submitted    Progress Note Due on Visit 10    PT Start Time 1400    PT Stop Time 1446    PT Time Calculation (min) 46 min    Equipment Utilized During Treatment Gait belt    Activity Tolerance Patient tolerated treatment well    Behavior During Therapy WFL for tasks assessed/performed               Past Medical History:  Diagnosis Date   Anemia    Arthritis    Back pain    Bursitis of left hip    Coronary artery disease    TWO VESSEL BYPASS SURGERY in January 1995   Diverticulosis    Hyperlipidemia    Hypertension    Monoclonal gammopathy    Neuropathy    Past Surgical History:  Procedure Laterality Date   ADENOIDECTOMY     BACK SURGERY  1985, 1995, 2002   X3   COLONOSCOPY     CORONARY ARTERY BYPASS GRAFT     X2. LIMA GRAFT AND A RIMA GRAFT.   INGUINAL HERNIA REPAIR Left    Negative stress echo  08/11/2009   US ECHOCARDIOGRAPHY  June 2012   Normal EF, mild aortic regurgitation, mild MR, LAE, mildly dilated RV, RAE, increased left atrial pressure, moderate to severe TR and mildly increased  PA systolic pressure.    Patient Active Problem List   Diagnosis Date Noted   Trochanteric bursitis, right hip 08/23/2021   CKD (chronic kidney disease), stage III (HCC) 04/01/2021   Positive anti-CCP test 11/24/2020   Hand deformity 11/24/2020   Pulmonary fibrosis (HCC) 08/24/2020   Diverticulosis of colon 08/24/2020   Melena 08/14/2020   Cough in adult 08/14/2020   Left rotator cuff tear arthropathy 09/16/2019   Other fatigue 03/06/2019    Alcoholic peripheral neuropathy (HCC) 03/12/2018   Sciatica, right side 10/11/2017   Acute pain of right shoulder 03/02/2017   Impingement syndrome of right shoulder 03/02/2017   Anemia due to chronic illness 03/13/2014   Monoclonal gammopathy    Lung nodules 03/11/2013   Weight loss, non-intentional 01/28/2013   CAD (coronary artery disease) 08/03/2011   HTN (hypertension) 08/03/2011   Hyperlipidemia 08/03/2011   Valvular heart disease 08/03/2011    ONSET DATE: 12/27/2023 (MD referral)  REFERRING DIAG:  M21.371 (ICD-10-CM) - Foot drop, right foot   THERAPY DIAG:  Muscle weakness (generalized)  Unsteadiness on feet  Other abnormalities of gait and mobility  Rationale for Evaluation and Treatment: Rehabilitation  SUBJECTIVE:  SUBJECTIVE STATEMENT: Back pain is so bad when standing. Pt accompanied by: self  PERTINENT HISTORY: Lumbar radiculopathy status post lumbar surgery, foot drop (11/2023); COPD, CAD, hypothyroidism, peripheral neuropathy  PAIN:  Are you having pain? Yes: NPRS scale: 5-7/10 Pain location: low back Pain description: sore, weak Aggravating factors: walking, standing or bending over Relieving factors: sitting, lying down  PRECAUTIONS: Fall and Other: back precautions lifted 12/26/2023  RED FLAGS: None   WEIGHT BEARING RESTRICTIONS: No  FALLS: Has patient fallen in last 6 months? Yes. Number of falls 8 (prior to surgery)  LIVING ENVIRONMENT: Lives with: lives with their spouse Lives in: House/apartment Stairs:  3 steps to enter Has following equipment at home: Single point cane and R AFO  PLOF: Independent and Leisure: enjoys going to weekly exercises at [employer]   PATIENT GOALS: Pt's goals for PT are to help to walk and get better.  OBJECTIVE:   Previous  back exercises:  pt has handouts from [facility] following previous back surgeries. Pt demo the following exercises from previous bouts of therapy:  cat/cow, quadruped posterior rocking, sidelying trunk rotation, prone extension.  PT provides cues to likely hold off on prone extension at this point, given his recent surgery, especially if that aggravates pain.  Cues to perform sidelying trunk rotation slowly.     TODAY'S TREATMENT: 01/04/2024 Activity Comments  Lumbar stabilization in quadruped:  lumbar spine neutral with alt UE lifts Cues for abdominal activation  Reviewed seated heel raises   Cane assisted ankle dorsiflexion   Step ups, RLE 10 reps BUE support and cues to lessen UE support  Standing bilat feet on step:  bilat gastroc stretch>up on toes Pt reports good stretch  Forward/back step and weightshfit, RLE as stance   Gait with R foot up brace, 50 ft x 4 reps, then no foot up brace x 40 ft 2 reps Improved step length, foot clearance with use of foot-up brace   Access Code: [email or web address] URL: [email or web address] Date: 01/04/2024 Prepared by: [facility] - Outpatient  Rehab - [facility]  Exercises - Seated Hamstring Stretch with Chair  - 1 x daily - 7 x weekly - 1 sets - 3 reps - 15-30 sec hold - Standing Gastroc Stretch at Counter  - 1-2 x daily - 7 x weekly - 1 sets - 3 reps - 15-30 sec hold - Sit to stand in stride stance  - 1-2 x daily - 7 x weekly - 3 sets - 5 reps - Seated Heel Raise  - 1 x daily - 7 x weekly - 3 sets - 10 reps - 3 sec hold - Standing Bilateral Gastroc Stretch with Step  - 1 x daily - 7 x weekly - 1 sets - 5 reps - 10 sec hold - Forward Step Up  - 1 x daily - 7 x weekly - 2 sets - 10 reps - 3 sec hold TODAY'S TREATMENT: 01/02/2024 Activity Comments  Review of HEP-cues to slow pace with sit<>stand Cues for technique with hamstring stretch, gastroc stretch, to avoid excess stretch/pain with stretching; discussed using a lower block/footstool and avoid  forward lean, in order to just feel a gentle stretch  Sit to stand from 20" mat, light UE support, 5 reps Cues for slowed pace eccentric control to sit  Stagger stance forward/back rocking, 2 x 5 reps RLE in forward position, with cues forward weightshift through hips and for terminal knee extension in full weightbearing  RLE as stance with LLE step taps to  6" treadmill step with BUE support   Berg Balance:  22/56 Scores <45/56 indicate increased fall risk  Forward/back walking in parallel bars, 3 reps Cues for full toe>heel in backwards direction; pt very fast in performing  Seated R ankle heel raises>foot flat; 10 reps Cues for slowed pace, eccentric control          PATIENT EDUCATION: Education details: HEP updates, cues for slowed technique with sit<>stand and for correct technique for stretches.  Discussed foot-up brace as option to help with improved gait pattern; pt would like to go back to using his AFO Person educated: Patient Education method: Explanation, Demonstration, and Handouts Education comprehension: verbalized understanding, returned demonstration, and needs further education ------------------------------------- Note: Objective measures were completed at Evaluation unless otherwise noted.  DIAGNOSTIC FINDINGS: MRI Lumbar spine, 08/2023:  IMPRESSION: 1. Mild interval enlargement of a chronic L1-2 disc extrusion with severe spinal and right lateral recess stenosis. 2. Unchanged severe spinal stenosis at L2-3 and L4-5. 3. Unchanged multilevel neural foraminal stenosis, most severe on the right at L3-4 and L4-5.    COGNITION: Overall cognitive status: Within functional limits for tasks assessed   SENSATION: Light touch: Impaired  and does reports some tingling due to neuropathy  COORDINATION: Decreased RLE  POSTURE: rounded shoulders  LOWER EXTREMITY ROM:   passive R ankle dorsiflexion to neutral  Active  Right Eval Left Eval  Hip flexion    Hip extension     Hip abduction    Hip adduction    Hip internal rotation    Hip external rotation    Knee flexion    Knee extension    Ankle dorsiflexion No active WFL  Ankle plantarflexion    Ankle inversion    Ankle eversion WFL    (Blank rows = not tested)  LOWER EXTREMITY MMT:    MMT Right Eval Left Eval  Hip flexion 3+ 4  Hip extension    Hip abduction    Hip adduction    Hip internal rotation    Hip external rotation    Knee flexion 4 5  Knee extension 5 5  Ankle dorsiflexion 1 3+  Ankle plantarflexion    Ankle inversion    Ankle eversion    (Blank rows = not tested)   TRANSFERS: Assistive device utilized: None , requires UE support Sit to stand: Modified independence Stand to sit: Modified independence  GAIT: Gait pattern: step through pattern, decreased step length- Right, decreased step length- Left, decreased stance time- Right, decreased ankle dorsiflexion- Right, and Right steppage Distance walked: 50 ft x 2 Assistive device utilized: Single point cane Level of assistance: SBA and CGA Comments: decreased foot clearance at times with RLE; has AFO, but did not wear to PT session today  FUNCTIONAL TESTS:  Timed up and go (TUG): 21.4 sec 10 meter walk test: 13.84 sec (2.37 ft/sec)  TREATMENT DATE: 12/28/2023    PATIENT EDUCATION: Education details: PT eval results, POC, initiated HEP Person educated: Patient Education method: Explanation, Demonstration, and Handouts Education comprehension: verbalized understanding, returned demonstration, and needs further education  HOME EXERCISE PROGRAM: Access Code: [email or web address] URL: [email or web address] Date: 12/28/2023 Prepared by: [facility] - Outpatient  Rehab - [facility]  Exercises - Seated Hamstring Stretch with Chair  - 1 x daily - 7 x weekly - 1 sets - 3 reps - 15-30 sec  hold - Standing Gastroc Stretch at Counter  - 1-2 x daily - 7 x weekly - 1 sets - 3 reps - 15-30 sec hold - Sit to stand in stride stance  - 1-2 x daily - 7 x weekly - 3 sets - 5 reps  GOALS: Goals reviewed with patient? Yes  SHORT TERM GOALS: Target date: 01/26/2024  Pt will be independent with HEP for improved strength, flexibility, balance, gait. Baseline: Goal status: IN PROGRESS  2.  Pt will demo improved flexibility in R ankle dorsiflexion by 10 degrees for improved gait and step negotiation. Baseline: neutral (passive) Goal status: IN PROGRESS  3.  Pt will improve TUG score to less than or equal to 15 sec for decreased fall risk.  Baseline: 21 sec Goal status: IN PROGRESS   LONG TERM GOALS: Target date: 02/23/24  Pt will be independent with progression of HEP for improved balance, flexibility, strength, gait. Baseline:  Goal status: IN PROGRESS  2.  Pt will improve gait velocity to at least 2.62 ft/sec for improved gait efficiency and safety. Baseline: 2.37 ft/sec Goal status: IN PROGRESS  3.  FTSTS score to be tested, with pt to perform without UE support in <15 sec. Baseline: pt requires UE support Goal status: IN PROGRESS  4.  Pt will negotiate curb, ramp, outdoor surfaces with appropriate device and no LOB for safety with community gait. Baseline:  Goal status: IN PROGRESS   ASSESSMENT:  CLINICAL IMPRESSION: Pt presents today, brining in cane to session and reports continued increased low back muscular pain (which occurs in static standing and is longstanding).  Skilled PT session focused on initial/brief review of pt's previously given back exercises from another bout of therapy.  He moves through the exercises quickly and PT advised slower motion for stretch and to hold off on prone extension at this time. Pt does respond well to cues for abdominal activation in sitting and standing with cues for neutral spine and no increased pain reported in activities  performed in PT session.  He does agree to trial foot up brace today, and this greatly improves foot clearance, step length and overall giat pattern; however, pt reports he will likely just return to using his AFO instead of pursuing foot-up brace.  Pt will continue to benefit from skilled PT towards goals for improved functional mobility and decreased fall risk.   OBJECTIVE IMPAIRMENTS: Abnormal gait, decreased balance, decreased mobility, difficulty walking, decreased ROM, decreased strength, impaired flexibility, and postural dysfunction.   ACTIVITY LIMITATIONS: bending, standing, squatting, transfers, and locomotion level  PARTICIPATION LIMITATIONS: shopping, community activity, and yard work  PERSONAL FACTORS: 3+ comorbidities: see above  are also affecting patient's functional outcome.   REHAB POTENTIAL: Good  CLINICAL DECISION MAKING: Stable/uncomplicated  EVALUATION COMPLEXITY: Low  PLAN:  PT FREQUENCY: 2x/week for 4 weeks, then 1x/wk for 4 weeks  PT DURATION: other: 9 weeks, including eval session  PLANNED INTERVENTIONS: 97110-Therapeutic exercises, 97530- Therapeutic activity, [id number]- Neuromuscular re-education, 97535- Self Care, 13086- Manual therapy, [id number]-  Gait training, 16109- Orthotic Fit/training, Balance training, and DME instructions  PLAN FOR NEXT SESSION: Review updates to HEP and progress for flexibility, strengthening, balance.  Trial (?again) foot-up brace with gait and balance activities or try pt's AFO (he may bring in).  Gait with cane and foot up to help with gait pattern and balance.  HEP to include RLE WB with LLE step taps, stagger stance weightshifting Pt lives in [place] (check with) [place] location to see if it would work for him to be seen there.*01/04/2024:  Pt would like to finish scheduled appts next week here and then is planning to contact [facility] for scheduling there the week of 4/14.  Gean Maidens., PT 01/04/2024, 5:03 PM  [place]  Outpatient Rehab at [facility] [address] [place], [address] [place], [place] [address] Phone # [phone] Fax # [phone]

## 2024-01-10 NOTE — Therapy (Incomplete)
 OUTPATIENT PHYSICAL THERAPY NEURO TREATMENT NOTE   Patient Name: Mitchell Fields MRN: [id number] DOB:06-Jul-1936, 88 y.o., male Today's Date: 01/10/2024   PCP: Cleatis Polka., MD REFERRING PROVIDER: Cleatis Polka., MD  END OF SESSION:      Past Medical History:  Diagnosis Date   Anemia    Arthritis    Back pain    Bursitis of left hip    Coronary artery disease    TWO VESSEL BYPASS SURGERY in January 1995   Diverticulosis    Hyperlipidemia    Hypertension    Monoclonal gammopathy    Neuropathy    Past Surgical History:  Procedure Laterality Date   ADENOIDECTOMY     BACK SURGERY  1985, 1995, 2002   X3   COLONOSCOPY     CORONARY ARTERY BYPASS GRAFT     X2. LIMA GRAFT AND A RIMA GRAFT.   INGUINAL HERNIA REPAIR Left    Negative stress echo  08/11/2009   US ECHOCARDIOGRAPHY  June 2012   Normal EF, mild aortic regurgitation, mild MR, LAE, mildly dilated RV, RAE, increased left atrial pressure, moderate to severe TR and mildly increased  PA systolic pressure.    Patient Active Problem List   Diagnosis Date Noted   Trochanteric bursitis, right hip 08/23/2021   CKD (chronic kidney disease), stage III (HCC) 04/01/2021   Positive anti-CCP test 11/24/2020   Hand deformity 11/24/2020   Pulmonary fibrosis (HCC) 08/24/2020   Diverticulosis of colon 08/24/2020   Melena 08/14/2020   Cough in adult 08/14/2020   Left rotator cuff tear arthropathy 09/16/2019   Other fatigue 03/06/2019   Alcoholic peripheral neuropathy (HCC) 03/12/2018   Sciatica, right side 10/11/2017   Acute pain of right shoulder 03/02/2017   Impingement syndrome of right shoulder 03/02/2017   Anemia due to chronic illness 03/13/2014   Monoclonal gammopathy    Lung nodules 03/11/2013   Weight loss, non-intentional 01/28/2013   CAD (coronary artery disease) 08/03/2011   HTN (hypertension) 08/03/2011   Hyperlipidemia 08/03/2011   Valvular heart disease 08/03/2011    ONSET DATE: 12/27/2023 (MD  referral)  REFERRING DIAG:  M21.371 (ICD-10-CM) - Foot drop, right foot   THERAPY DIAG:  No diagnosis found.  Rationale for Evaluation and Treatment: Rehabilitation  SUBJECTIVE:                                                                                                                                                                                             SUBJECTIVE STATEMENT: Back pain is so bad when standing. Pt accompanied by: self  PERTINENT HISTORY: Lumbar radiculopathy status post lumbar  surgery, foot drop (11/2023); COPD, CAD, hypothyroidism, peripheral neuropathy  PAIN:  Are you having pain? Yes: NPRS scale: 5-7/10 Pain location: low back Pain description: sore, weak Aggravating factors: walking, standing or bending over Relieving factors: sitting, lying down  PRECAUTIONS: Fall and Other: back precautions lifted 12/26/2023  RED FLAGS: None   WEIGHT BEARING RESTRICTIONS: No  FALLS: Has patient fallen in last 6 months? Yes. Number of falls 8 (prior to surgery)  LIVING ENVIRONMENT: Lives with: lives with their spouse Lives in: House/apartment Stairs:  3 steps to enter Has following equipment at home: Single point cane and R AFO  PLOF: Independent and Leisure: enjoys going to weekly exercises at [employer]   PATIENT GOALS: Pt's goals for PT are to help to walk and get better.  OBJECTIVE:     TODAY'S TREATMENT: 01/11/24 Activity Comments                        Previous back exercises:  pt has handouts from [facility] following previous back surgeries. Pt demo the following exercises from previous bouts of therapy:  cat/cow, quadruped posterior rocking, sidelying trunk rotation, prone extension.  PT provides cues to likely hold off on prone extension at this point, given his recent surgery, especially if that aggravates pain.  Cues to perform sidelying trunk rotation slowly.     TODAY'S TREATMENT: 01/04/2024 Activity Comments  Lumbar stabilization in  quadruped:  lumbar spine neutral with alt UE lifts Cues for abdominal activation  Reviewed seated heel raises   Cane assisted ankle dorsiflexion   Step ups, RLE 10 reps BUE support and cues to lessen UE support  Standing bilat feet on step:  bilat gastroc stretch>up on toes Pt reports good stretch  Forward/back step and weightshfit, RLE as stance   Gait with R foot up brace, 50 ft x 4 reps, then no foot up brace x 40 ft 2 reps Improved step length, foot clearance with use of foot-up brace   Access Code: [email or web address] URL: [email or web address] Date: 01/04/2024 Prepared by: [facility] - Outpatient  Rehab - [facility]  Exercises - Seated Hamstring Stretch with Chair  - 1 x daily - 7 x weekly - 1 sets - 3 reps - 15-30 sec hold - Standing Gastroc Stretch at Counter  - 1-2 x daily - 7 x weekly - 1 sets - 3 reps - 15-30 sec hold - Sit to stand in stride stance  - 1-2 x daily - 7 x weekly - 3 sets - 5 reps - Seated Heel Raise  - 1 x daily - 7 x weekly - 3 sets - 10 reps - 3 sec hold - Standing Bilateral Gastroc Stretch with Step  - 1 x daily - 7 x weekly - 1 sets - 5 reps - 10 sec hold - Forward Step Up  - 1 x daily - 7 x weekly - 2 sets - 10 reps - 3 sec hold TODAY'S TREATMENT: 01/02/2024 Activity Comments  Review of HEP-cues to slow pace with sit<>stand Cues for technique with hamstring stretch, gastroc stretch, to avoid excess stretch/pain with stretching; discussed using a lower block/footstool and avoid forward lean, in order to just feel a gentle stretch  Sit to stand from 20" mat, light UE support, 5 reps Cues for slowed pace eccentric control to sit  Stagger stance forward/back rocking, 2 x 5 reps RLE in forward position, with cues forward weightshift through hips and for terminal knee extension in full weightbearing  RLE as stance with LLE step taps to 6" treadmill step with BUE support   Berg Balance:  22/56 Scores <45/56 indicate increased fall risk  Forward/back walking in  parallel bars, 3 reps Cues for full toe>heel in backwards direction; pt very fast in performing  Seated R ankle heel raises>foot flat; 10 reps Cues for slowed pace, eccentric control          PATIENT EDUCATION: Education details: HEP updates, cues for slowed technique with sit<>stand and for correct technique for stretches.  Discussed foot-up brace as option to help with improved gait pattern; pt would like to go back to using his AFO Person educated: Patient Education method: Explanation, Demonstration, and Handouts Education comprehension: verbalized understanding, returned demonstration, and needs further education ------------------------------------- Note: Objective measures were completed at Evaluation unless otherwise noted.  DIAGNOSTIC FINDINGS: MRI Lumbar spine, 08/2023:  IMPRESSION: 1. Mild interval enlargement of a chronic L1-2 disc extrusion with severe spinal and right lateral recess stenosis. 2. Unchanged severe spinal stenosis at L2-3 and L4-5. 3. Unchanged multilevel neural foraminal stenosis, most severe on the right at L3-4 and L4-5.    COGNITION: Overall cognitive status: Within functional limits for tasks assessed   SENSATION: Light touch: Impaired  and does reports some tingling due to neuropathy  COORDINATION: Decreased RLE  POSTURE: rounded shoulders  LOWER EXTREMITY ROM:   passive R ankle dorsiflexion to neutral  Active  Right Eval Left Eval  Hip flexion    Hip extension    Hip abduction    Hip adduction    Hip internal rotation    Hip external rotation    Knee flexion    Knee extension    Ankle dorsiflexion No active WFL  Ankle plantarflexion    Ankle inversion    Ankle eversion WFL    (Blank rows = not tested)  LOWER EXTREMITY MMT:    MMT Right Eval Left Eval  Hip flexion 3+ 4  Hip extension    Hip abduction    Hip adduction    Hip internal rotation    Hip external rotation    Knee flexion 4 5  Knee extension 5 5  Ankle  dorsiflexion 1 3+  Ankle plantarflexion    Ankle inversion    Ankle eversion    (Blank rows = not tested)   TRANSFERS: Assistive device utilized: None , requires UE support Sit to stand: Modified independence Stand to sit: Modified independence  GAIT: Gait pattern: step through pattern, decreased step length- Right, decreased step length- Left, decreased stance time- Right, decreased ankle dorsiflexion- Right, and Right steppage Distance walked: 50 ft x 2 Assistive device utilized: Single point cane Level of assistance: SBA and CGA Comments: decreased foot clearance at times with RLE; has AFO, but did not wear to PT session today  FUNCTIONAL TESTS:  Timed up and go (TUG): 21.4 sec 10 meter walk test: 13.84 sec (2.37 ft/sec)  TREATMENT DATE: 12/28/2023    PATIENT EDUCATION: Education details: PT eval results, POC, initiated HEP Person educated: Patient Education method: Explanation, Demonstration, and Handouts Education comprehension: verbalized understanding, returned demonstration, and needs further education  HOME EXERCISE PROGRAM: Access Code: [email or web address] URL: [email or web address] Date: 12/28/2023 Prepared by: [facility] - Outpatient  Rehab - [facility]  Exercises - Seated Hamstring Stretch with Chair  - 1 x daily - 7 x weekly - 1 sets - 3 reps - 15-30 sec hold - Standing Gastroc Stretch at Counter  - 1-2 x daily - 7 x weekly - 1 sets - 3 reps - 15-30 sec hold - Sit to stand in stride stance  - 1-2 x daily - 7 x weekly - 3 sets - 5 reps  GOALS: Goals reviewed with patient? Yes  SHORT TERM GOALS: Target date: 01/26/2024  Pt will be independent with HEP for improved strength, flexibility, balance, gait. Baseline: Goal status: IN PROGRESS  2.  Pt will demo improved flexibility in R ankle dorsiflexion by 10 degrees for improved  gait and step negotiation. Baseline: neutral (passive) Goal status: IN PROGRESS  3.  Pt will improve TUG score to less than or equal to 15 sec for decreased fall risk.  Baseline: 21 sec Goal status: IN PROGRESS   LONG TERM GOALS: Target date: 02/23/24  Pt will be independent with progression of HEP for improved balance, flexibility, strength, gait. Baseline:  Goal status: IN PROGRESS  2.  Pt will improve gait velocity to at least 2.62 ft/sec for improved gait efficiency and safety. Baseline: 2.37 ft/sec Goal status: IN PROGRESS  3.  FTSTS score to be tested, with pt to perform without UE support in <15 sec. Baseline: pt requires UE support Goal status: IN PROGRESS  4.  Pt will negotiate curb, ramp, outdoor surfaces with appropriate device and no LOB for safety with community gait. Baseline:  Goal status: IN PROGRESS   ASSESSMENT:  CLINICAL IMPRESSION: Pt presents today, brining in cane to session and reports continued increased low back muscular pain (which occurs in static standing and is longstanding).  Skilled PT session focused on initial/brief review of pt's previously given back exercises from another bout of therapy.  He moves through the exercises quickly and PT advised slower motion for stretch and to hold off on prone extension at this time. Pt does respond well to cues for abdominal activation in sitting and standing with cues for neutral spine and no increased pain reported in activities performed in PT session.  He does agree to trial foot up brace today, and this greatly improves foot clearance, step length and overall giat pattern; however, pt reports he will likely just return to using his AFO instead of pursuing foot-up brace.  Pt will continue to benefit from skilled PT towards goals for improved functional mobility and decreased fall risk.   OBJECTIVE IMPAIRMENTS: Abnormal gait, decreased balance, decreased mobility, difficulty walking, decreased ROM, decreased  strength, impaired flexibility, and postural dysfunction.   ACTIVITY LIMITATIONS: bending, standing, squatting, transfers, and locomotion level  PARTICIPATION LIMITATIONS: shopping, community activity, and yard work  PERSONAL FACTORS: 3+ comorbidities: see above  are also affecting patient's functional outcome.   REHAB POTENTIAL: Good  CLINICAL DECISION MAKING: Stable/uncomplicated  EVALUATION COMPLEXITY: Low  PLAN:  PT FREQUENCY: 2x/week for 4 weeks, then 1x/wk for 4 weeks  PT DURATION: other: 9 weeks, including eval session  PLANNED INTERVENTIONS: 97110-Therapeutic exercises, 97530- Therapeutic activity, [id number]- Neuromuscular re-education, 97535- Self Care, [id number]- Manual therapy, [id number]-  Gait training, 16109- Orthotic Fit/training, Balance training, and DME instructions  PLAN FOR NEXT SESSION: Review updates to HEP and progress for flexibility, strengthening, balance.  Trial (?again) foot-up brace with gait and balance activities or try pt's AFO (he may bring in).  Gait with cane and foot up to help with gait pattern and balance.  HEP to include RLE WB with LLE step taps, stagger stance weightshifting Pt lives in [place] (check with) [place] location to see if it would work for him to be seen there.*01/04/2024:  Pt would like to finish scheduled appts next week here and then is planning to contact [facility] for scheduling there the week of 4/14.

## 2024-01-11 ENCOUNTER — Ambulatory Visit: Admitting: Physical Therapy

## 2024-01-11 DIAGNOSIS — M6281 Muscle weakness (generalized): Secondary | ICD-10-CM

## 2024-01-11 DIAGNOSIS — R2689 Other abnormalities of gait and mobility: Secondary | ICD-10-CM

## 2024-01-11 DIAGNOSIS — R2681 Unsteadiness on feet: Secondary | ICD-10-CM

## 2024-01-11 NOTE — Therapy (Signed)
 OUTPATIENT PHYSICAL THERAPY NEURO TREATMENT NOTE   Patient Name: Mitchell Fields MRN: [id number] DOB:[birth year], 88 y.o., male Today's Date: 01/12/2024   PCP: Cleatis Polka., MD REFERRING PROVIDER: Cleatis Polka., MD  END OF SESSION:  PT End of Session - 01/12/24 0829     Visit Number 4    Number of Visits 13    Date for PT Re-Evaluation 02/23/24    Authorization Type [employer] Medicare- approved 13 PT visits from 12/28/2023-02/29/2024    Authorization Time Period auth submitted    Progress Note Due on Visit 10    PT Start Time 1106    PT Stop Time 1151    PT Time Calculation (min) 45 min    Equipment Utilized During Treatment Gait belt    Activity Tolerance Patient tolerated treatment well    Behavior During Therapy WFL for tasks assessed/performed                Past Medical History:  Diagnosis Date   Anemia    Arthritis    Back pain    Bursitis of left hip    Coronary artery disease    TWO VESSEL BYPASS SURGERY in January 1995   Diverticulosis    Hyperlipidemia    Hypertension    Monoclonal gammopathy    Neuropathy    Past Surgical History:  Procedure Laterality Date   ADENOIDECTOMY     BACK SURGERY  1985, 1995, 2002   X3   COLONOSCOPY     CORONARY ARTERY BYPASS GRAFT     X2. LIMA GRAFT AND A RIMA GRAFT.   INGUINAL HERNIA REPAIR Left    Negative stress echo  08/11/2009   US ECHOCARDIOGRAPHY  June 2012   Normal EF, mild aortic regurgitation, mild MR, LAE, mildly dilated RV, RAE, increased left atrial pressure, moderate to severe TR and mildly increased  PA systolic pressure.    Patient Active Problem List   Diagnosis Date Noted   Trochanteric bursitis, right hip 08/23/2021   CKD (chronic kidney disease), stage III (HCC) 04/01/2021   Positive anti-CCP test 11/24/2020   Hand deformity 11/24/2020   Pulmonary fibrosis (HCC) 08/24/2020   Diverticulosis of colon 08/24/2020   Melena 08/14/2020   Cough in adult 08/14/2020   Left rotator cuff tear  arthropathy 09/16/2019   Other fatigue 03/06/2019   Alcoholic peripheral neuropathy (HCC) 03/12/2018   Sciatica, right side 10/11/2017   Acute pain of right shoulder 03/02/2017   Impingement syndrome of right shoulder 03/02/2017   Anemia due to chronic illness 03/13/2014   Monoclonal gammopathy    Lung nodules 03/11/2013   Weight loss, non-intentional 01/28/2013   CAD (coronary artery disease) 08/03/2011   HTN (hypertension) 08/03/2011   Hyperlipidemia 08/03/2011   Valvular heart disease 08/03/2011    ONSET DATE: 12/27/2023 (MD referral)  REFERRING DIAG:  M21.371 (ICD-10-CM) - Foot drop, right foot   THERAPY DIAG:  Muscle weakness (generalized)  Unsteadiness on feet  Other abnormalities of gait and mobility  Rationale for Evaluation and Treatment: Rehabilitation  SUBJECTIVE:  SUBJECTIVE STATEMENT: Had to go to the MD for chest congestion and I'm on medication.  When I pulled the scab off the shin, it got infected.  Forgot to tell the doctor. Pt accompanied by: self  PERTINENT HISTORY: Lumbar radiculopathy status post lumbar surgery, foot drop (11/2023); COPD, CAD, hypothyroidism, peripheral neuropathy  PAIN:  Are you having pain? Yes: NPRS scale: 5-7/10 Pain location: low back Pain description: sore, weak Aggravating factors: walking, standing or bending over Relieving factors: sitting, lying down  PRECAUTIONS: Fall and Other: back precautions lifted 12/26/2023  RED FLAGS: None   WEIGHT BEARING RESTRICTIONS: No  FALLS: Has patient fallen in last 6 months? Yes. Number of falls 8 (prior to surgery)  LIVING ENVIRONMENT: Lives with: lives with their spouse Lives in: House/apartment Stairs:  3 steps to enter Has following equipment at home: Single point cane and R AFO  PLOF:  Independent and Leisure: enjoys going to weekly exercises at [employer]   PATIENT GOALS: Pt's goals for PT are to help to walk and get better.  OBJECTIVE:    TODAY'S TREATMENT: 4/10/20205 Activity Comments  Seated heelslides RLE, 5 reps   Sit to stand from 18" surface, 3  x 5 reps Green band around thighs to prevent R hip adduction  Standing on Airex: Alt forward step taps, 10 reps Forward/back step x 5 reps Stand feet apart head turns/nods EC Good activation RLE while on Airex  Forward/back step and weightshift, RLE as stance, solid surface, 2 x 10   Forward step ups, each leg, 10 reps Heavy BUE support; pt reports fatigue  Vitals 93%, HR 91 bpm    Access Code: [email or web address] URL: [email or web address] Date: 01/11/2024 Prepared by: [facility] - Outpatient  Rehab - [facility]  Exercises - Seated Hamstring Stretch with Chair  - 1 x daily - 7 x weekly - 1 sets - 3 reps - 15-30 sec hold - Standing Gastroc Stretch at Counter  - 1-2 x daily - 7 x weekly - 1 sets - 3 reps - 15-30 sec hold - Sit to stand in stride stance  - 1-2 x daily - 7 x weekly - 3 sets - 5 reps - Seated Heel Raise  - 1 x daily - 7 x weekly - 3 sets - 10 reps - 3 sec hold - Standing Bilateral Gastroc Stretch with Step  - 1 x daily - 7 x weekly - 1 sets - 5 reps - 10 sec hold - Forward Step Up  - 1 x daily - 7 x weekly - 2 sets - 10 reps - 3 sec hold - Sit to Stand with Resistance Around Legs  - 1 x daily - 7 x weekly - 3 sets - 5 reps   PATIENT EDUCATION: Education details: HEP updates, follow up with MD regards to R shin wound Person educated: Patient Education method: Explanation, Demonstration, and Handouts Education comprehension: verbalized understanding, returned demonstration, and needs further education ------------------------------------- Note: Objective measures were completed at Evaluation unless otherwise noted.  DIAGNOSTIC FINDINGS: MRI Lumbar spine, 08/2023:  IMPRESSION: 1. Mild  interval enlargement of a chronic L1-2 disc extrusion with severe spinal and right lateral recess stenosis. 2. Unchanged severe spinal stenosis at L2-3 and L4-5. 3. Unchanged multilevel neural foraminal stenosis, most severe on the right at L3-4 and L4-5.    COGNITION: Overall cognitive status: Within functional limits for tasks assessed   SENSATION: Light touch: Impaired  and does reports some tingling due to neuropathy  COORDINATION: Decreased RLE  POSTURE: rounded shoulders  LOWER EXTREMITY ROM:   passive R ankle dorsiflexion to neutral  Active  Right Eval Left Eval  Hip flexion    Hip extension    Hip abduction    Hip adduction    Hip internal rotation    Hip external rotation    Knee flexion    Knee extension    Ankle dorsiflexion No active WFL  Ankle plantarflexion    Ankle inversion    Ankle eversion WFL    (Blank rows = not tested)  LOWER EXTREMITY MMT:    MMT Right Eval Left Eval  Hip flexion 3+ 4  Hip extension    Hip abduction    Hip adduction    Hip internal rotation    Hip external rotation    Knee flexion 4 5  Knee extension 5 5  Ankle dorsiflexion 1 3+  Ankle plantarflexion    Ankle inversion    Ankle eversion    (Blank rows = not tested)   TRANSFERS: Assistive device utilized: None , requires UE support Sit to stand: Modified independence Stand to sit: Modified independence  GAIT: Gait pattern: step through pattern, decreased step length- Right, decreased step length- Left, decreased stance time- Right, decreased ankle dorsiflexion- Right, and Right steppage Distance walked: 50 ft x 2 Assistive device utilized: Single point cane Level of assistance: SBA and CGA Comments: decreased foot clearance at times with RLE; has AFO, but did not wear to PT session today  FUNCTIONAL TESTS:  Timed up and go (TUG): 21.4 sec 10 meter walk test: 13.84 sec (2.37 ft/sec)                                                                                                                               TREATMENT DATE: 12/28/2023    PATIENT EDUCATION: Education details: PT eval results, POC, initiated HEP Person educated: Patient Education method: Explanation, Demonstration, and Handouts Education comprehension: verbalized understanding, returned demonstration, and needs further education  HOME EXERCISE PROGRAM: Access Code: [email or web address] URL: [email or web address] Date: 12/28/2023 Prepared by: [facility] - Outpatient  Rehab - [facility]  Exercises - Seated Hamstring Stretch with Chair  - 1 x daily - 7 x weekly - 1 sets - 3 reps - 15-30 sec hold - Standing Gastroc Stretch at Counter  - 1-2 x daily - 7 x weekly - 1 sets - 3 reps - 15-30 sec hold - Sit to stand in stride stance  - 1-2 x daily - 7 x weekly - 3 sets - 5 reps  GOALS: Goals reviewed with patient? Yes  SHORT TERM GOALS: Target date: 01/26/2024  Pt will be independent with HEP for improved strength, flexibility, balance, gait. Baseline: Goal status: IN PROGRESS  2.  Pt will demo improved flexibility in R ankle dorsiflexion by 10 degrees for improved gait and step negotiation. Baseline: neutral (passive) Goal status: IN PROGRESS  3.  Pt will improve TUG score to  less than or equal to 15 sec for decreased fall risk.  Baseline: 21 sec Goal status: IN PROGRESS   LONG TERM GOALS: Target date: 02/23/24  Pt will be independent with progression of HEP for improved balance, flexibility, strength, gait. Baseline:  Goal status: IN PROGRESS  2.  Pt will improve gait velocity to at least 2.62 ft/sec for improved gait efficiency and safety. Baseline: 2.37 ft/sec Goal status: IN PROGRESS  3.  FTSTS score to be tested, with pt to perform without UE support in <15 sec. Baseline: pt requires UE support Goal status: IN PROGRESS  4.  Pt will negotiate curb, ramp, outdoor surfaces with appropriate device and no LOB for safety with community gait. Baseline:  Goal  status: IN PROGRESS   ASSESSMENT:  CLINICAL IMPRESSION: Pt presents today with reports that he has had a respiratory infection this week; has seen MD to get antibiotics.  Also, of note, he reported last visit that he cut scab off the wound on his shin; since that time, he has had increased redness and discharge from that area. PT observes that this area is open and has yellow, moist wound bed, with some redness around.   Advised pt to follow up with his MD about this.  Skilled PT session focused on continued ways to increased Weightbearing for NMR and strenghtening to RLE.  He is unable to wear AFO due to his shin wound, so he continues with steppage pattern.  Updated HEP to work on sit to stand from regular chair height with attention to hip position with resisted band around thights. Pt is planning to transition to [facility], closer to his home, beginning next week. Pt will continue to benefit from skilled PT towards goals for improved functional mobility and decreased fall risk.   OBJECTIVE IMPAIRMENTS: Abnormal gait, decreased balance, decreased mobility, difficulty walking, decreased ROM, decreased strength, impaired flexibility, and postural dysfunction.   ACTIVITY LIMITATIONS: bending, standing, squatting, transfers, and locomotion level  PARTICIPATION LIMITATIONS: shopping, community activity, and yard work  PERSONAL FACTORS: 3+ comorbidities: see above  are also affecting patient's functional outcome.   REHAB POTENTIAL: Good  CLINICAL DECISION MAKING: Stable/uncomplicated  EVALUATION COMPLEXITY: Low  PLAN:  PT FREQUENCY: 2x/week for 4 weeks, then 1x/wk for 4 weeks  PT DURATION: other: 9 weeks, including eval session  PLANNED INTERVENTIONS: 97110-Therapeutic exercises, 97530- Therapeutic activity, 97112- Neuromuscular re-education, 97535- Self Care, 32440- Manual therapy, [id number]- Gait training, [phone]- Orthotic Fit/training, Balance training, and DME instructions  PLAN FOR  NEXT SESSION: Review updates to HEP and progress for flexibility, strengthening, balance.  Back pain is limiting prolonged standing, so more fully assess back pain and ways to help him stand longer without pain.  Gean Maidens., PT 01/12/2024, 8:31 AM  [facility] at [facility] [address] [place], [address] [place], [place] [address] Phone # [phone] Fax # [phone]

## 2024-01-12 ENCOUNTER — Ambulatory Visit: Admitting: Physical Therapy

## 2024-01-15 ENCOUNTER — Encounter

## 2024-01-18 ENCOUNTER — Encounter

## 2024-01-23 ENCOUNTER — Ambulatory Visit

## 2024-01-23 DIAGNOSIS — R2681 Unsteadiness on feet: Secondary | ICD-10-CM

## 2024-01-23 DIAGNOSIS — M6281 Muscle weakness (generalized): Secondary | ICD-10-CM

## 2024-01-23 DIAGNOSIS — R2689 Other abnormalities of gait and mobility: Secondary | ICD-10-CM

## 2024-01-23 NOTE — Therapy (Signed)
 OUTPATIENT PHYSICAL THERAPY NEURO TREATMENT NOTE   Patient Name: Mitchell Fields MRN: [id number] DOB:[birth year], 88 y.o., male Today's Date: 01/23/2024   PCP: Jeannine Milroy., MD REFERRING PROVIDER: Jeannine Milroy., MD  END OF SESSION:  PT End of Session - 01/23/24 1525     Visit Number 5    Number of Visits 13    Date for PT Re-Evaluation 02/23/24    Authorization Type [employer] Medicare- approved 13 PT visits from 12/28/2023-02/29/2024    Authorization Time Period auth submitted    Progress Note Due on Visit 10    PT Start Time 1515    PT Stop Time 1600    PT Time Calculation (min) 45 min    Activity Tolerance Patient tolerated treatment well    Behavior During Therapy [facility] for tasks assessed/performed                 Past Medical History:  Diagnosis Date   Anemia    Arthritis    Back pain    Bursitis of left hip    Coronary artery disease    TWO VESSEL BYPASS SURGERY in January 1995   Diverticulosis    Hyperlipidemia    Hypertension    Monoclonal gammopathy    Neuropathy    Past Surgical History:  Procedure Laterality Date   ADENOIDECTOMY     BACK SURGERY  1985, 1995, 2002   X3   COLONOSCOPY     CORONARY ARTERY BYPASS GRAFT     X2. LIMA GRAFT AND A RIMA GRAFT.   INGUINAL HERNIA REPAIR Left    Negative stress echo  08/11/2009   US  ECHOCARDIOGRAPHY  June 2012   Normal EF, mild aortic regurgitation, mild MR, LAE, mildly dilated RV, RAE, increased left atrial pressure, moderate to severe TR and mildly increased  PA systolic pressure.    Patient Active Problem List   Diagnosis Date Noted   Trochanteric bursitis, right hip 08/23/2021   CKD (chronic kidney disease), stage III (HCC) 04/01/2021   Positive anti-CCP test 11/24/2020   Hand deformity 11/24/2020   Pulmonary fibrosis (HCC) 08/24/2020   Diverticulosis of colon 08/24/2020   Melena 08/14/2020   Cough in adult 08/14/2020   Left rotator cuff tear arthropathy 09/16/2019   Other fatigue  03/06/2019   Alcoholic peripheral neuropathy (HCC) 03/12/2018   Sciatica, right side 10/11/2017   Acute pain of right shoulder 03/02/2017   Impingement syndrome of right shoulder 03/02/2017   Anemia due to chronic illness 03/13/2014   Monoclonal gammopathy    Lung nodules 03/11/2013   Weight loss, non-intentional 01/28/2013   CAD (coronary artery disease) 08/03/2011   HTN (hypertension) 08/03/2011   Hyperlipidemia 08/03/2011   Valvular heart disease 08/03/2011    ONSET DATE: 12/27/2023 (MD referral)  REFERRING DIAG:  M21.371 (ICD-10-CM) - Foot drop, right foot   THERAPY DIAG:  Muscle weakness (generalized)  Unsteadiness on feet  Other abnormalities of gait and mobility  Rationale for Evaluation and Treatment: Rehabilitation  SUBJECTIVE:  SUBJECTIVE STATEMENT: Patient reports that he is hurting today. He has been taking his pain medication for the pain.  Pt accompanied by: self  PERTINENT HISTORY: Lumbar radiculopathy status post lumbar surgery, foot drop (11/2023); COPD, CAD, hypothyroidism, peripheral neuropathy  PAIN:  Are you having pain? Yes: NPRS scale: no pain score provided Pain location: low back Pain description: sore, weak Aggravating factors: walking, standing or bending over Relieving factors: sitting, lying down  PRECAUTIONS: Fall and Other: back precautions lifted 12/26/2023  RED FLAGS: None   WEIGHT BEARING RESTRICTIONS: No  FALLS: Has patient fallen in last 6 months? Yes. Number of falls 8 (prior to surgery)  LIVING ENVIRONMENT: Lives with: lives with their spouse Lives in: House/apartment Stairs:  3 steps to enter Has following equipment at home: Single point cane and R AFO  PLOF: Independent and Leisure: enjoys going to weekly exercises at [employer]   PATIENT  GOALS: Pt's goals for PT are to help to walk and get better.  OBJECTIVE:    ------------------------------------- Note: Objective measures were completed at Evaluation unless otherwise noted.  DIAGNOSTIC FINDINGS: MRI Lumbar spine, 08/2023:  IMPRESSION: 1. Mild interval enlargement of a chronic L1-2 disc extrusion with severe spinal and right lateral recess stenosis. 2. Unchanged severe spinal stenosis at L2-3 and L4-5. 3. Unchanged multilevel neural foraminal stenosis, most severe on the right at L3-4 and L4-5.    COGNITION: Overall cognitive status: Within functional limits for tasks assessed   SENSATION: Light touch: Impaired  and does reports some tingling due to neuropathy  COORDINATION: Decreased RLE  POSTURE: rounded shoulders  LOWER EXTREMITY ROM:   passive R ankle dorsiflexion to neutral  Active  Right Eval Left Eval  Hip flexion    Hip extension    Hip abduction    Hip adduction    Hip internal rotation    Hip external rotation    Knee flexion    Knee extension    Ankle dorsiflexion No active WFL  Ankle plantarflexion    Ankle inversion    Ankle eversion WFL    (Blank rows = not tested)  LOWER EXTREMITY MMT:    MMT Right Eval Left Eval  Hip flexion 3+ 4  Hip extension    Hip abduction    Hip adduction    Hip internal rotation    Hip external rotation    Knee flexion 4 5  Knee extension 5 5  Ankle dorsiflexion 1 3+  Ankle plantarflexion    Ankle inversion    Ankle eversion    (Blank rows = not tested)   TRANSFERS: Assistive device utilized: None , requires UE support Sit to stand: Modified independence Stand to sit: Modified independence  GAIT: Gait pattern: step through pattern, decreased step length- Right, decreased step length- Left, decreased stance time- Right, decreased ankle dorsiflexion- Right, and Right steppage Distance walked: 50 ft x 2 Assistive device utilized: Single point cane Level of assistance: SBA and  CGA Comments: decreased foot clearance at times with RLE; has AFO, but did not wear to PT session today  FUNCTIONAL TESTS:  Timed up and go (TUG): 21.4 sec 10 meter walk test: 13.84 sec (2.37 ft/sec)  TREATMENT DATE:                                     01/23/24 EXERCISE LOG  Exercise Repetitions and Resistance Comments  Nustep  L4 x 15 minutes   Seated hip ADD isometric  3.5 minutes w/ 5 second hold    Seated clams  Green t-band x 3.5 minutes    Rocker board (seated) 3 minutes   Static stance on foam  4 minutes    Blank cell = exercise not performed today    TODAY'S TREATMENT: 4/10/20205 Activity Comments  Seated heelslides RLE, 5 reps   Sit to stand from 18" surface, 3  x 5 reps Green band around thighs to prevent R hip adduction  Standing on Airex: Alt forward step taps, 10 reps Forward/back step x 5 reps Stand feet apart head turns/nods EC Good activation RLE while on Airex  Forward/back step and weightshift, RLE as stance, solid surface, 2 x 10   Forward step ups, each leg, 10 reps Heavy BUE support; pt reports fatigue  Vitals 93%, HR 91 bpm    Access Code: [email or web address] URL: [email or web address] Date: 01/11/2024 Prepared by: [facility] - Outpatient  Rehab - [facility]  Exercises - Seated Hamstring Stretch with Chair  - 1 x daily - 7 x weekly - 1 sets - 3 reps - 15-30 sec hold - Standing Gastroc Stretch at Counter  - 1-2 x daily - 7 x weekly - 1 sets - 3 reps - 15-30 sec hold - Sit to stand in stride stance  - 1-2 x daily - 7 x weekly - 3 sets - 5 reps - Seated Heel Raise  - 1 x daily - 7 x weekly - 3 sets - 10 reps - 3 sec hold - Standing Bilateral Gastroc Stretch with Step  - 1 x daily - 7 x weekly - 1 sets - 5 reps - 10 sec hold - Forward Step Up  - 1 x daily - 7 x weekly - 2 sets - 10 reps - 3 sec hold - Sit to Stand with  Resistance Around Legs  - 1 x daily - 7 x weekly - 3 sets - 5 reps   PATIENT EDUCATION: Education details: anatomy and healing Person educated: Patient Education method: Explanation Education comprehension: verbalized understanding  HOME EXERCISE PROGRAM: Access Code: [email or web address] URL: [email or web address] Date: 12/28/2023 Prepared by: [facility] - Outpatient  Rehab - [facility]  Exercises - Seated Hamstring Stretch with Chair  - 1 x daily - 7 x weekly - 1 sets - 3 reps - 15-30 sec hold - Standing Gastroc Stretch at Counter  - 1-2 x daily - 7 x weekly - 1 sets - 3 reps - 15-30 sec hold - Sit to stand in stride stance  - 1-2 x daily - 7 x weekly - 3 sets - 5 reps  GOALS: Goals reviewed with patient? Yes  SHORT TERM GOALS: Target date: 01/26/2024  Pt will be independent with HEP for improved strength, flexibility, balance, gait. Baseline: Goal status: IN PROGRESS  2.  Pt will demo improved flexibility in R ankle dorsiflexion by 10 degrees for improved gait and step negotiation. Baseline: neutral (passive) Goal status: IN PROGRESS  3.  Pt will improve TUG score to less than or equal to 15 sec for decreased fall risk.  Baseline: 21 sec Goal status: IN PROGRESS   LONG TERM  GOALS: Target date: 02/23/24  Pt will be independent with progression of HEP for improved balance, flexibility, strength, gait. Baseline:  Goal status: IN PROGRESS  2.  Pt will improve gait velocity to at least 2.62 ft/sec for improved gait efficiency and safety. Baseline: 2.37 ft/sec Goal status: IN PROGRESS  3.  FTSTS score to be tested, with pt to perform without UE support in <15 sec. Baseline: pt requires UE support Goal status: IN PROGRESS  4.  Pt will negotiate curb, ramp, outdoor surfaces with appropriate device and no LOB for safety with community gait. Baseline:  Goal status: IN PROGRESS   ASSESSMENT:  CLINICAL IMPRESSION: Patient was progressed with multiple new seated  interventions for improved lower extremity strength with moderate difficulty. He required minimal cueing with today's new interventions for proper exercise performance. He experienced a mild increase in low back pain with today's standing interventions, but this did not limit his ability to complete any of today's interventions. He reported that his back was hurting more upon the conclusion of treatment. He continues to require skilled physical therapy to address his remaining impairments to maximize his functional mobility.   OBJECTIVE IMPAIRMENTS: Abnormal gait, decreased balance, decreased mobility, difficulty walking, decreased ROM, decreased strength, impaired flexibility, and postural dysfunction.   ACTIVITY LIMITATIONS: bending, standing, squatting, transfers, and locomotion level  PARTICIPATION LIMITATIONS: shopping, community activity, and yard work  PERSONAL FACTORS: 3+ comorbidities: see above  are also affecting patient's functional outcome.   REHAB POTENTIAL: Good  CLINICAL DECISION MAKING: Stable/uncomplicated  EVALUATION COMPLEXITY: Low  PLAN:  PT FREQUENCY: 2x/week for 4 weeks, then 1x/wk for 4 weeks  PT DURATION: other: 9 weeks, including eval session  PLANNED INTERVENTIONS: 97110-Therapeutic exercises, 97530- Therapeutic activity, 97112- Neuromuscular re-education, 97535- Self Care, 16109- Manual therapy, [id number]- Gait training, [id number]- Orthotic Fit/training, Balance training, and DME instructions  PLAN FOR NEXT SESSION: Review updates to HEP and progress for flexibility, strengthening, balance.  Back pain is limiting prolonged standing, so more fully assess back pain and ways to help him stand longer without pain.  Lane Pinon, PT 01/23/2024, 5:04 PM

## 2024-01-30 ENCOUNTER — Ambulatory Visit

## 2024-01-30 DIAGNOSIS — M6281 Muscle weakness (generalized): Secondary | ICD-10-CM | POA: Diagnosis not present

## 2024-01-30 DIAGNOSIS — R2681 Unsteadiness on feet: Secondary | ICD-10-CM

## 2024-01-30 DIAGNOSIS — R2689 Other abnormalities of gait and mobility: Secondary | ICD-10-CM

## 2024-01-30 NOTE — Therapy (Signed)
 OUTPATIENT PHYSICAL THERAPY NEURO TREATMENT NOTE   Patient Name: Mitchell Fields MRN: [id number] DOB:1936/06/04, 88 y.o., male Today's Date: 01/30/2024   PCP: Jeannine Milroy., MD REFERRING PROVIDER: Jeannine Milroy., MD  END OF SESSION:  PT End of Session - 01/30/24 1351     Visit Number 6    Number of Visits 13    Date for PT Re-Evaluation 02/23/24    Authorization Type [employer] Medicare- approved 13 PT visits from 12/28/2023-02/29/2024    Authorization Time Period auth submitted    Progress Note Due on Visit 10    PT Start Time 1345    PT Stop Time 1430    PT Time Calculation (min) 45 min    Activity Tolerance Patient tolerated treatment well    Behavior During Therapy [facility] for tasks assessed/performed                 Past Medical History:  Diagnosis Date   Anemia    Arthritis    Back pain    Bursitis of left hip    Coronary artery disease    TWO VESSEL BYPASS SURGERY in January 1995   Diverticulosis    Hyperlipidemia    Hypertension    Monoclonal gammopathy    Neuropathy    Past Surgical History:  Procedure Laterality Date   ADENOIDECTOMY     BACK SURGERY  1985, 1995, 2002   X3   COLONOSCOPY     CORONARY ARTERY BYPASS GRAFT     X2. LIMA GRAFT AND A RIMA GRAFT.   INGUINAL HERNIA REPAIR Left    Negative stress echo  08/11/2009   US  ECHOCARDIOGRAPHY  June 2012   Normal EF, mild aortic regurgitation, mild MR, LAE, mildly dilated RV, RAE, increased left atrial pressure, moderate to severe TR and mildly increased  PA systolic pressure.    Patient Active Problem List   Diagnosis Date Noted   Trochanteric bursitis, right hip 08/23/2021   CKD (chronic kidney disease), stage III (HCC) 04/01/2021   Positive anti-CCP test 11/24/2020   Hand deformity 11/24/2020   Pulmonary fibrosis (HCC) 08/24/2020   Diverticulosis of colon 08/24/2020   Melena 08/14/2020   Cough in adult 08/14/2020   Left rotator cuff tear arthropathy 09/16/2019   Other fatigue  03/06/2019   Alcoholic peripheral neuropathy (HCC) 03/12/2018   Sciatica, right side 10/11/2017   Acute pain of right shoulder 03/02/2017   Impingement syndrome of right shoulder 03/02/2017   Anemia due to chronic illness 03/13/2014   Monoclonal gammopathy    Lung nodules 03/11/2013   Weight loss, non-intentional 01/28/2013   CAD (coronary artery disease) 08/03/2011   HTN (hypertension) 08/03/2011   Hyperlipidemia 08/03/2011   Valvular heart disease 08/03/2011    ONSET DATE: 12/27/2023 (MD referral)  REFERRING DIAG:  M21.371 (ICD-10-CM) - Foot drop, right foot   THERAPY DIAG:  Muscle weakness (generalized)  Unsteadiness on feet  Other abnormalities of gait and mobility  Rationale for Evaluation and Treatment: Rehabilitation  SUBJECTIVE:  SUBJECTIVE STATEMENT: Pt reports that he feels "awful today" and that last treatment "damn near killed him."  Pt accompanied by: self  PERTINENT HISTORY: Lumbar radiculopathy status post lumbar surgery, foot drop (11/2023); COPD, CAD, hypothyroidism, peripheral neuropathy  PAIN:  Are you having pain? Yes: NPRS scale: 6/10 Pain location: low back Pain description: sore, weak Aggravating factors: walking, standing or bending over Relieving factors: sitting, lying down  PRECAUTIONS: Fall and Other: back precautions lifted 12/26/2023  RED FLAGS: None   WEIGHT BEARING RESTRICTIONS: No  FALLS: Has patient fallen in last 6 months? Yes. Number of falls 8 (prior to surgery)  LIVING ENVIRONMENT: Lives with: lives with their spouse Lives in: House/apartment Stairs:  3 steps to enter Has following equipment at home: Single point cane and R AFO  PLOF: Independent and Leisure: enjoys going to weekly exercises at [employer]   PATIENT GOALS: Pt's goals for PT  are to help to walk and get better.  OBJECTIVE:    ------------------------------------- Note: Objective measures were completed at Evaluation unless otherwise noted.  DIAGNOSTIC FINDINGS: MRI Lumbar spine, 08/2023:  IMPRESSION: 1. Mild interval enlargement of a chronic L1-2 disc extrusion with severe spinal and right lateral recess stenosis. 2. Unchanged severe spinal stenosis at L2-3 and L4-5. 3. Unchanged multilevel neural foraminal stenosis, most severe on the right at L3-4 and L4-5.    COGNITION: Overall cognitive status: Within functional limits for tasks assessed   SENSATION: Light touch: Impaired  and does reports some tingling due to neuropathy  COORDINATION: Decreased RLE  POSTURE: rounded shoulders  LOWER EXTREMITY ROM:   passive R ankle dorsiflexion to neutral  Active  Right Eval Left Eval  Hip flexion    Hip extension    Hip abduction    Hip adduction    Hip internal rotation    Hip external rotation    Knee flexion    Knee extension    Ankle dorsiflexion No active WFL  Ankle plantarflexion    Ankle inversion    Ankle eversion WFL    (Blank rows = not tested)  LOWER EXTREMITY MMT:    MMT Right Eval Left Eval  Hip flexion 3+ 4  Hip extension    Hip abduction    Hip adduction    Hip internal rotation    Hip external rotation    Knee flexion 4 5  Knee extension 5 5  Ankle dorsiflexion 1 3+  Ankle plantarflexion    Ankle inversion    Ankle eversion    (Blank rows = not tested)   TRANSFERS: Assistive device utilized: None , requires UE support Sit to stand: Modified independence Stand to sit: Modified independence  GAIT: Gait pattern: step through pattern, decreased step length- Right, decreased step length- Left, decreased stance time- Right, decreased ankle dorsiflexion- Right, and Right steppage Distance walked: 50 ft x 2 Assistive device utilized: Single point cane Level of assistance: SBA and CGA Comments: decreased foot  clearance at times with RLE; has AFO, but did not wear to PT session today  FUNCTIONAL TESTS:  Timed up and go (TUG): 21.4 sec 10 meter walk test: 13.84 sec (2.37 ft/sec)  TREATMENT DATE:     01/30/24                                  EXERCISE LOG  Exercise Repetitions and Resistance Comments  Nustep  Lvl 4 x 15 mins   Supine Clams Red x 1 mins; Green 1.5 mins   Supine Hip ADD 3 mins   SKTC 10 reps x 3 sec hold bil   LTR 10 reps x 3 sec hold bil   Supine bridges 15 reps     Blank cell = exercise not performed today   Manual Therapy Soft Tissue Mobilization: Lumbar, STW/M to right lumbar paraspinals and QL to decrease in pain and tone, pt positioned in left side-lying for comfort                                     01/23/24 EXERCISE LOG  Exercise Repetitions and Resistance Comments  Nustep  L4 x 15 minutes   Seated hip ADD isometric  3.5 minutes w/ 5 second hold    Seated clams  Green t-band x 3.5 minutes    Rocker board (seated) 3 minutes   Static stance on foam  4 minutes    Blank cell = exercise not performed today    TODAY'S TREATMENT: 4/10/20205 Activity Comments  Seated heelslides RLE, 5 reps   Sit to stand from 18" surface, 3  x 5 reps Green band around thighs to prevent R hip adduction  Standing on Airex: Alt forward step taps, 10 reps Forward/back step x 5 reps Stand feet apart head turns/nods EC Good activation RLE while on Airex  Forward/back step and weightshift, RLE as stance, solid surface, 2 x 10   Forward step ups, each leg, 10 reps Heavy BUE support; pt reports fatigue  Vitals 93%, HR 91 bpm    Access Code: [email or web address] URL: [email or web address] Date: 01/11/2024 Prepared by: [facility] - Outpatient  Rehab - [facility]  Exercises - Seated Hamstring Stretch with Chair  - 1 x daily - 7 x weekly - 1 sets - 3 reps -  15-30 sec hold - Standing Gastroc Stretch at Counter  - 1-2 x daily - 7 x weekly - 1 sets - 3 reps - 15-30 sec hold - Sit to stand in stride stance  - 1-2 x daily - 7 x weekly - 3 sets - 5 reps - Seated Heel Raise  - 1 x daily - 7 x weekly - 3 sets - 10 reps - 3 sec hold - Standing Bilateral Gastroc Stretch with Step  - 1 x daily - 7 x weekly - 1 sets - 5 reps - 10 sec hold - Forward Step Up  - 1 x daily - 7 x weekly - 2 sets - 10 reps - 3 sec hold - Sit to Stand with Resistance Around Legs  - 1 x daily - 7 x weekly - 3 sets - 5 reps   PATIENT EDUCATION: Education details: anatomy and healing Person educated: Patient Education method: Explanation Education comprehension: verbalized understanding  HOME EXERCISE PROGRAM: Access Code: [email or web address] URL: [email or web address] Date: 12/28/2023 Prepared by: [facility] - Outpatient  Rehab - [facility]  Exercises - Seated Hamstring Stretch with Chair  - 1 x daily - 7 x weekly - 1 sets - 3 reps - 15-30 sec hold - Standing  Gastroc Stretch at [employer]  - 1-2 x daily - 7 x weekly - 1 sets - 3 reps - 15-30 sec hold - Sit to stand in stride stance  - 1-2 x daily - 7 x weekly - 3 sets - 5 reps  GOALS: Goals reviewed with patient? Yes  SHORT TERM GOALS: Target date: 01/26/2024  Pt will be independent with HEP for improved strength, flexibility, balance, gait. Baseline: Goal status: IN PROGRESS  2.  Pt will demo improved flexibility in R ankle dorsiflexion by 10 degrees for improved gait and step negotiation. Baseline: neutral (passive) Goal status: IN PROGRESS  3.  Pt will improve TUG score to less than or equal to 15 sec for decreased fall risk.  Baseline: 21 sec Goal status: IN PROGRESS   LONG TERM GOALS: Target date: 02/23/24  Pt will be independent with progression of HEP for improved balance, flexibility, strength, gait. Baseline:  Goal status: IN PROGRESS  2.  Pt will improve gait velocity to at least 2.62 ft/sec  for improved gait efficiency and safety. Baseline: 2.37 ft/sec Goal status: IN PROGRESS  3.  FTSTS score to be tested, with pt to perform without UE support in <15 sec. Baseline: pt requires UE support Goal status: IN PROGRESS  4.  Pt will negotiate curb, ramp, outdoor surfaces with appropriate device and no LOB for safety with community gait. Baseline:  Goal status: IN PROGRESS   ASSESSMENT:  CLINICAL IMPRESSION:  Pt arrives for today's treatment session reporting 6/10 low back pain.  Pt introduced to supine exercises and stretches today to decrease pain and tone.  Pt requiring min cues for proper technique, pacing, and hold times.  STW/M performed right lumbar paraspinals and QL to decrease pain and tone.  Pt reports 4/10 right low back pain at completion of today's treatment session.   OBJECTIVE IMPAIRMENTS: Abnormal gait, decreased balance, decreased mobility, difficulty walking, decreased ROM, decreased strength, impaired flexibility, and postural dysfunction.   ACTIVITY LIMITATIONS: bending, standing, squatting, transfers, and locomotion level  PARTICIPATION LIMITATIONS: shopping, community activity, and yard work  PERSONAL FACTORS: 3+ comorbidities: see above  are also affecting patient's functional outcome.   REHAB POTENTIAL: Good  CLINICAL DECISION MAKING: Stable/uncomplicated  EVALUATION COMPLEXITY: Low  PLAN:  PT FREQUENCY: 2x/week for 4 weeks, then 1x/wk for 4 weeks  PT DURATION: other: 9 weeks, including eval session  PLANNED INTERVENTIONS: 97110-Therapeutic exercises, 97530- Therapeutic activity, 97112- Neuromuscular re-education, 97535- Self Care, 16109- Manual therapy, [id number]- Gait training, [phone]- Orthotic Fit/training, Balance training, and DME instructions  PLAN FOR NEXT SESSION: Review updates to HEP and progress for flexibility, strengthening, balance.  Back pain is limiting prolonged standing, so more fully assess back pain and ways to help him stand  longer without pain.  Deryl Flora, PTA 01/30/2024, 2:58 PM

## 2024-02-01 ENCOUNTER — Ambulatory Visit: Attending: Internal Medicine

## 2024-02-01 DIAGNOSIS — R2689 Other abnormalities of gait and mobility: Secondary | ICD-10-CM | POA: Insufficient documentation

## 2024-02-01 DIAGNOSIS — R2681 Unsteadiness on feet: Secondary | ICD-10-CM | POA: Diagnosis present

## 2024-02-01 DIAGNOSIS — M6281 Muscle weakness (generalized): Secondary | ICD-10-CM | POA: Diagnosis present

## 2024-02-01 NOTE — Therapy (Signed)
 OUTPATIENT PHYSICAL THERAPY NEURO TREATMENT NOTE   Patient Name: Mitchell Fields MRN: [id number] DOB:[birth year], 88 y.o., male Today's Date: 02/01/2024   PCP: Jeannine Milroy., MD REFERRING PROVIDER: Jeannine Milroy., MD  END OF SESSION:  PT End of Session - 02/01/24 1352     Visit Number 7    Number of Visits 13    Date for PT Re-Evaluation 02/23/24    Authorization Type [employer] Medicare- approved 13 PT visits from 12/28/2023-02/29/2024    Authorization Time Period auth submitted    Progress Note Due on Visit 10    PT Start Time 1345    PT Stop Time 1431    PT Time Calculation (min) 46 min    Activity Tolerance Patient tolerated treatment well    Behavior During Therapy [facility] for tasks assessed/performed                 Past Medical History:  Diagnosis Date   Anemia    Arthritis    Back pain    Bursitis of left hip    Coronary artery disease    TWO VESSEL BYPASS SURGERY in January 1995   Diverticulosis    Hyperlipidemia    Hypertension    Monoclonal gammopathy    Neuropathy    Past Surgical History:  Procedure Laterality Date   ADENOIDECTOMY     BACK SURGERY  1985, 1995, 2002   X3   COLONOSCOPY     CORONARY ARTERY BYPASS GRAFT     X2. LIMA GRAFT AND A RIMA GRAFT.   INGUINAL HERNIA REPAIR Left    Negative stress echo  08/11/2009   US  ECHOCARDIOGRAPHY  June 2012   Normal EF, mild aortic regurgitation, mild MR, LAE, mildly dilated RV, RAE, increased left atrial pressure, moderate to severe TR and mildly increased  PA systolic pressure.    Patient Active Problem List   Diagnosis Date Noted   Trochanteric bursitis, right hip 08/23/2021   CKD (chronic kidney disease), stage III (HCC) 04/01/2021   Positive anti-CCP test 11/24/2020   Hand deformity 11/24/2020   Pulmonary fibrosis (HCC) 08/24/2020   Diverticulosis of colon 08/24/2020   Melena 08/14/2020   Cough in adult 08/14/2020   Left rotator cuff tear arthropathy 09/16/2019   Other fatigue 03/06/2019    Alcoholic peripheral neuropathy (HCC) 03/12/2018   Sciatica, right side 10/11/2017   Acute pain of right shoulder 03/02/2017   Impingement syndrome of right shoulder 03/02/2017   Anemia due to chronic illness 03/13/2014   Monoclonal gammopathy    Lung nodules 03/11/2013   Weight loss, non-intentional 01/28/2013   CAD (coronary artery disease) 08/03/2011   HTN (hypertension) 08/03/2011   Hyperlipidemia 08/03/2011   Valvular heart disease 08/03/2011    ONSET DATE: 12/27/2023 (MD referral)  REFERRING DIAG:  M21.371 (ICD-10-CM) - Foot drop, right foot   THERAPY DIAG:  Muscle weakness (generalized)  Unsteadiness on feet  Other abnormalities of gait and mobility  Rationale for Evaluation and Treatment: Rehabilitation  SUBJECTIVE:  SUBJECTIVE STATEMENT: Pt reports 4/10 low back pain today.  Pt states he took a pain pill before coming to therapy today.  Pt accompanied by: self  PERTINENT HISTORY: Lumbar radiculopathy status post lumbar surgery, foot drop (11/2023); COPD, CAD, hypothyroidism, peripheral neuropathy  PAIN:  Are you having pain? Yes: NPRS scale: 4/10 Pain location: low back Pain description: sore, weak Aggravating factors: walking, standing or bending over Relieving factors: sitting, lying down  PRECAUTIONS: Fall and Other: back precautions lifted 12/26/2023  RED FLAGS: None   WEIGHT BEARING RESTRICTIONS: No  FALLS: Has patient fallen in last 6 months? Yes. Number of falls 8 (prior to surgery)  LIVING ENVIRONMENT: Lives with: lives with their spouse Lives in: House/apartment Stairs:  3 steps to enter Has following equipment at home: Single point cane and R AFO  PLOF: Independent and Leisure: enjoys going to weekly exercises at [employer]   PATIENT GOALS: Pt's goals for  PT are to help to walk and get better.  OBJECTIVE:    ------------------------------------- Note: Objective measures were completed at Evaluation unless otherwise noted.  DIAGNOSTIC FINDINGS: MRI Lumbar spine, 08/2023:  IMPRESSION: 1. Mild interval enlargement of a chronic L1-2 disc extrusion with severe spinal and right lateral recess stenosis. 2. Unchanged severe spinal stenosis at L2-3 and L4-5. 3. Unchanged multilevel neural foraminal stenosis, most severe on the right at L3-4 and L4-5.    COGNITION: Overall cognitive status: Within functional limits for tasks assessed   SENSATION: Light touch: Impaired  and does reports some tingling due to neuropathy  COORDINATION: Decreased RLE  POSTURE: rounded shoulders  LOWER EXTREMITY ROM:   passive R ankle dorsiflexion to neutral  Active  Right Eval Left Eval  Hip flexion    Hip extension    Hip abduction    Hip adduction    Hip internal rotation    Hip external rotation    Knee flexion    Knee extension    Ankle dorsiflexion No active WFL  Ankle plantarflexion    Ankle inversion    Ankle eversion WFL    (Blank rows = not tested)  LOWER EXTREMITY MMT:    MMT Right Eval Left Eval  Hip flexion 3+ 4  Hip extension    Hip abduction    Hip adduction    Hip internal rotation    Hip external rotation    Knee flexion 4 5  Knee extension 5 5  Ankle dorsiflexion 1 3+  Ankle plantarflexion    Ankle inversion    Ankle eversion    (Blank rows = not tested)   TRANSFERS: Assistive device utilized: None , requires UE support Sit to stand: Modified independence Stand to sit: Modified independence  GAIT: Gait pattern: step through pattern, decreased step length- Right, decreased step length- Left, decreased stance time- Right, decreased ankle dorsiflexion- Right, and Right steppage Distance walked: 50 ft x 2 Assistive device utilized: Single point cane Level of assistance: SBA and CGA Comments: decreased foot  clearance at times with RLE; has AFO, but did not wear to PT session today  FUNCTIONAL TESTS:  Timed up and go (TUG): 21.4 sec 10 meter walk test: 13.84 sec (2.37 ft/sec)  TREATMENT DATE:     02/01/24                                 EXERCISE LOG  Exercise Repetitions and Resistance Comments  Nustep Lvl 4 x 15 mins   [employer] 3 mins   Ball Rollout 3 mins   Rows Red x 25 reps   [place] Red x 25 reps   LAQs 2# x 20 reps bil   Seated Marches 2# x 20 reps bil   Seated Ham Curls Red x 20 reps bil    Blank cell = exercise not performed today   01/30/24                                  EXERCISE LOG  Exercise Repetitions and Resistance Comments  Nustep  Lvl 4 x 15 mins   Supine Clams Red x 1 mins; Green 1.5 mins   Supine Hip ADD 3 mins   SKTC 10 reps x 3 sec hold bil   LTR 10 reps x 3 sec hold bil   Supine bridges 15 reps     Blank cell = exercise not performed today   Manual Therapy Soft Tissue Mobilization: Lumbar, STW/M to right lumbar paraspinals and QL to decrease in pain and tone, pt positioned in left side-lying for comfort                                     01/23/24 EXERCISE LOG  Exercise Repetitions and Resistance Comments  Nustep  L4 x 15 minutes   Seated hip ADD isometric  3.5 minutes w/ 5 second hold    Seated clams  Green t-band x 3.5 minutes    Rocker board (seated) 3 minutes   Static stance on foam  4 minutes    Blank cell = exercise not performed today     Access Code: [email or web address] URL: [email or web address] Date: 01/11/2024 Prepared by: [facility] - Outpatient  Rehab - [facility]  Exercises - Seated Hamstring Stretch with Chair  - 1 x daily - 7 x weekly - 1 sets - 3 reps - 15-30 sec hold - Standing Gastroc Stretch at Counter  - 1-2 x daily - 7 x weekly - 1 sets - 3 reps - 15-30 sec hold - Sit to stand in stride  stance  - 1-2 x daily - 7 x weekly - 3 sets - 5 reps - Seated Heel Raise  - 1 x daily - 7 x weekly - 3 sets - 10 reps - 3 sec hold - Standing Bilateral Gastroc Stretch with Step  - 1 x daily - 7 x weekly - 1 sets - 5 reps - 10 sec hold - Forward Step Up  - 1 x daily - 7 x weekly - 2 sets - 10 reps - 3 sec hold - Sit to Stand with Resistance Around Legs  - 1 x daily - 7 x weekly - 3 sets - 5 reps   PATIENT EDUCATION: Education details: anatomy and healing Person educated: Patient Education method: Explanation Education comprehension: verbalized understanding  HOME EXERCISE PROGRAM: Access Code: [email or web address] URL: [email or web address] Date: 12/28/2023 Prepared by: [facility] - Outpatient  Rehab - [facility]  Exercises - Seated Hamstring Stretch with Chair  - 1 x  daily - 7 x weekly - 1 sets - 3 reps - 15-30 sec hold - Standing Gastroc Stretch at Counter  - 1-2 x daily - 7 x weekly - 1 sets - 3 reps - 15-30 sec hold - Sit to stand in stride stance  - 1-2 x daily - 7 x weekly - 3 sets - 5 reps  GOALS: Goals reviewed with patient? Yes  SHORT TERM GOALS: Target date: 01/26/2024  Pt will be independent with HEP for improved strength, flexibility, balance, gait. Baseline: Goal status: IN PROGRESS  2.  Pt will demo improved flexibility in R ankle dorsiflexion by 10 degrees for improved gait and step negotiation. Baseline: neutral (passive) Goal status: IN PROGRESS  3.  Pt will improve TUG score to less than or equal to 15 sec for decreased fall risk.  Baseline: 21 sec Goal status: IN PROGRESS   LONG TERM GOALS: Target date: 02/23/24  Pt will be independent with progression of HEP for improved balance, flexibility, strength, gait. Baseline:  Goal status: IN PROGRESS  2.  Pt will improve gait velocity to at least 2.62 ft/sec for improved gait efficiency and safety. Baseline: 2.37 ft/sec Goal status: IN PROGRESS  3.  FTSTS score to be tested, with pt to perform  without UE support in <15 sec. Baseline: pt requires UE support Goal status: IN PROGRESS  4.  Pt will negotiate curb, ramp, outdoor surfaces with appropriate device and no LOB for safety with community gait. Baseline:  Goal status: IN PROGRESS   ASSESSMENT:  CLINICAL IMPRESSION: Pt arrives for today's treatment session 4/10 low back pain.  Pt instructed in several new standing and seated exercises today to increase strength, function, and ROM with low back.  Pt requiring min cues for proper posture with all physio-ball exercises today.  Pt requiring min cues for proper technique and posture with all seated exercises today.  Pt reported minimal decrease in pain at completion of today's treatment session.    OBJECTIVE IMPAIRMENTS: Abnormal gait, decreased balance, decreased mobility, difficulty walking, decreased ROM, decreased strength, impaired flexibility, and postural dysfunction.   ACTIVITY LIMITATIONS: bending, standing, squatting, transfers, and locomotion level  PARTICIPATION LIMITATIONS: shopping, community activity, and yard work  PERSONAL FACTORS: 3+ comorbidities: see above  are also affecting patient's functional outcome.   REHAB POTENTIAL: Good  CLINICAL DECISION MAKING: Stable/uncomplicated  EVALUATION COMPLEXITY: Low  PLAN:  PT FREQUENCY: 2x/week for 4 weeks, then 1x/wk for 4 weeks  PT DURATION: other: 9 weeks, including eval session  PLANNED INTERVENTIONS: 97110-Therapeutic exercises, 97530- Therapeutic activity, 97112- Neuromuscular re-education, 97535- Self Care, [id number]- Manual therapy, [id number]- Gait training, [phone]- Orthotic Fit/training, Balance training, and DME instructions  PLAN FOR NEXT SESSION: Review updates to HEP and progress for flexibility, strengthening, balance.  Back pain is limiting prolonged standing, so more fully assess back pain and ways to help him stand longer without pain.  Deryl Flora, PTA 02/01/2024, 2:37 PM

## 2024-02-06 ENCOUNTER — Ambulatory Visit

## 2024-02-06 DIAGNOSIS — R2689 Other abnormalities of gait and mobility: Secondary | ICD-10-CM

## 2024-02-06 DIAGNOSIS — R2681 Unsteadiness on feet: Secondary | ICD-10-CM

## 2024-02-06 DIAGNOSIS — M6281 Muscle weakness (generalized): Secondary | ICD-10-CM | POA: Diagnosis not present

## 2024-02-06 NOTE — Therapy (Signed)
 OUTPATIENT PHYSICAL THERAPY NEURO TREATMENT NOTE   Patient Name: Mitchell Fields MRN: [id number] DOB:October 20, 1935, 88 y.o., male Today's Date: 02/06/2024   PCP: Jeannine Milroy., MD REFERRING PROVIDER: Jeannine Milroy., MD  END OF SESSION:  PT End of Session - 02/06/24 1433     Visit Number 8    Number of Visits 13    Date for PT Re-Evaluation 02/23/24    Authorization Type [employer] Medicare- approved 13 PT visits from 12/28/2023-02/29/2024    Authorization Time Period auth submitted    Progress Note Due on Visit 10    PT Start Time 1430    PT Stop Time 1512    PT Time Calculation (min) 42 min    Activity Tolerance Patient tolerated treatment well    Behavior During Therapy [facility] for tasks assessed/performed                  Past Medical History:  Diagnosis Date   Anemia    Arthritis    Back pain    Bursitis of left hip    Coronary artery disease    TWO VESSEL BYPASS SURGERY in January 1995   Diverticulosis    Hyperlipidemia    Hypertension    Monoclonal gammopathy    Neuropathy    Past Surgical History:  Procedure Laterality Date   ADENOIDECTOMY     BACK SURGERY  1985, 1995, 2002   X3   COLONOSCOPY     CORONARY ARTERY BYPASS GRAFT     X2. LIMA GRAFT AND A RIMA GRAFT.   INGUINAL HERNIA REPAIR Left    Negative stress echo  08/11/2009   US  ECHOCARDIOGRAPHY  June 2012   Normal EF, mild aortic regurgitation, mild MR, LAE, mildly dilated RV, RAE, increased left atrial pressure, moderate to severe TR and mildly increased  PA systolic pressure.    Patient Active Problem List   Diagnosis Date Noted   Trochanteric bursitis, right hip 08/23/2021   CKD (chronic kidney disease), stage III (HCC) 04/01/2021   Positive anti-CCP test 11/24/2020   Hand deformity 11/24/2020   Pulmonary fibrosis (HCC) 08/24/2020   Diverticulosis of colon 08/24/2020   Melena 08/14/2020   Cough in adult 08/14/2020   Left rotator cuff tear arthropathy 09/16/2019   Other fatigue  03/06/2019   Alcoholic peripheral neuropathy (HCC) 03/12/2018   Sciatica, right side 10/11/2017   Acute pain of right shoulder 03/02/2017   Impingement syndrome of right shoulder 03/02/2017   Anemia due to chronic illness 03/13/2014   Monoclonal gammopathy    Lung nodules 03/11/2013   Weight loss, non-intentional 01/28/2013   CAD (coronary artery disease) 08/03/2011   HTN (hypertension) 08/03/2011   Hyperlipidemia 08/03/2011   Valvular heart disease 08/03/2011    ONSET DATE: 12/27/2023 (MD referral)  REFERRING DIAG:  M21.371 (ICD-10-CM) - Foot drop, right foot   THERAPY DIAG:  Muscle weakness (generalized)  Unsteadiness on feet  Other abnormalities of gait and mobility  Rationale for Evaluation and Treatment: Rehabilitation  SUBJECTIVE:  SUBJECTIVE STATEMENT: Patient reports that he feels alright today.   Pt accompanied by: self  PERTINENT HISTORY: Lumbar radiculopathy status post lumbar surgery, foot drop (11/2023); COPD, CAD, hypothyroidism, peripheral neuropathy  PAIN:  Are you having pain? Yes: NPRS scale: no pain score provided Pain location: low back Pain description: sore, weak Aggravating factors: walking, standing or bending over Relieving factors: sitting, lying down  PRECAUTIONS: Fall and Other: back precautions lifted 12/26/2023  RED FLAGS: None   WEIGHT BEARING RESTRICTIONS: No  FALLS: Has patient fallen in last 6 months? Yes. Number of falls 8 (prior to surgery)  LIVING ENVIRONMENT: Lives with: lives with their spouse Lives in: House/apartment Stairs:  3 steps to enter Has following equipment at home: Single point cane and R AFO  PLOF: Independent and Leisure: enjoys going to weekly exercises at [employer]   PATIENT GOALS: Pt's goals for PT are to help to walk  and get better.  OBJECTIVE:    ------------------------------------- Note: Objective measures were completed at Evaluation unless otherwise noted.  DIAGNOSTIC FINDINGS: MRI Lumbar spine, 08/2023:  IMPRESSION: 1. Mild interval enlargement of a chronic L1-2 disc extrusion with severe spinal and right lateral recess stenosis. 2. Unchanged severe spinal stenosis at L2-3 and L4-5. 3. Unchanged multilevel neural foraminal stenosis, most severe on the right at L3-4 and L4-5.    COGNITION: Overall cognitive status: Within functional limits for tasks assessed   SENSATION: Light touch: Impaired  and does reports some tingling due to neuropathy  COORDINATION: Decreased RLE  POSTURE: rounded shoulders  LOWER EXTREMITY ROM:   passive R ankle dorsiflexion to neutral  Active  Right Eval Left Eval  Hip flexion    Hip extension    Hip abduction    Hip adduction    Hip internal rotation    Hip external rotation    Knee flexion    Knee extension    Ankle dorsiflexion No active WFL  Ankle plantarflexion    Ankle inversion    Ankle eversion WFL    (Blank rows = not tested)  LOWER EXTREMITY MMT:    MMT Right Eval Left Eval  Hip flexion 3+ 4  Hip extension    Hip abduction    Hip adduction    Hip internal rotation    Hip external rotation    Knee flexion 4 5  Knee extension 5 5  Ankle dorsiflexion 1 3+  Ankle plantarflexion    Ankle inversion    Ankle eversion    (Blank rows = not tested)   TRANSFERS: Assistive device utilized: None , requires UE support Sit to stand: Modified independence Stand to sit: Modified independence  GAIT: Gait pattern: step through pattern, decreased step length- Right, decreased step length- Left, decreased stance time- Right, decreased ankle dorsiflexion- Right, and Right steppage Distance walked: 50 ft x 2 Assistive device utilized: Single point cane Level of assistance: SBA and CGA Comments: decreased foot clearance at times with  RLE; has AFO, but did not wear to PT session today  FUNCTIONAL TESTS:  Timed up and go (TUG): 21.4 sec 10 meter walk test: 13.84 sec (2.37 ft/sec)  TREATMENT DATE:                                     02/06/24 EXERCISE LOG  Exercise Repetitions and Resistance Comments  Nustep  L4 x 15.5 minutes   LAQ  2# x 25 reps each    Rocker board (seated) 3.5 minutes    Seated HS curl  Red t-band x 25 reps each         Blank cell = exercise not performed today    02/01/24                                 EXERCISE LOG  Exercise Repetitions and Resistance Comments  Nustep Lvl 4 x 15 mins   [employer] 3 mins   Ball Rollout 3 mins   Rows Red x 25 reps   Pulldowns Red x 25 reps   LAQs 2# x 20 reps bil   Seated Marches 2# x 20 reps bil   Seated Ham Curls Red x 20 reps bil    Blank cell = exercise not performed today   01/30/24                                  EXERCISE LOG  Exercise Repetitions and Resistance Comments  Nustep  Lvl 4 x 15 mins   Supine Clams Red x 1 mins; Green 1.5 mins   Supine Hip ADD 3 mins   SKTC 10 reps x 3 sec hold bil   LTR 10 reps x 3 sec hold bil   Supine bridges 15 reps     Blank cell = exercise not performed today   Manual Therapy Soft Tissue Mobilization: Lumbar, STW/M to right lumbar paraspinals and QL to decrease in pain and tone, pt positioned in left side-lying for comfort    PATIENT EDUCATION: Education details: anatomy and healing Person educated: Patient Education method: Explanation Education comprehension: verbalized understanding  HOME EXERCISE PROGRAM: Access Code: [email or web address] URL: [email or web address] Date: 12/28/2023 Prepared by: [facility] - Outpatient  Rehab - [facility]  Exercises - Seated Hamstring Stretch with Chair  - 1 x daily - 7 x weekly - 1 sets - 3 reps - 15-30 sec hold - Standing Gastroc  Stretch at Counter  - 1-2 x daily - 7 x weekly - 1 sets - 3 reps - 15-30 sec hold - Sit to stand in stride stance  - 1-2 x daily - 7 x weekly - 3 sets - 5 reps  GOALS: Goals reviewed with patient? Yes  SHORT TERM GOALS: Target date: 01/26/2024  Pt will be independent with HEP for improved strength, flexibility, balance, gait. Baseline: Goal status: IN PROGRESS  2.  Pt will demo improved flexibility in R ankle dorsiflexion by 10 degrees for improved gait and step negotiation. Baseline: neutral (passive) Goal status: IN PROGRESS  3.  Pt will improve TUG score to less than or equal to 15 sec for decreased fall risk.  Baseline: 21 sec Goal status: IN PROGRESS   LONG TERM GOALS: Target date: 02/23/24  Pt will be independent with progression of HEP for improved balance, flexibility, strength, gait. Baseline:  Goal status: IN PROGRESS  2.  Pt will improve gait velocity to at least 2.62 ft/sec for improved gait efficiency and  safety. Baseline: 2.37 ft/sec Goal status: IN PROGRESS  3.  FTSTS score to be tested, with pt to perform without UE support in <15 sec. Baseline: pt requires UE support Goal status: IN PROGRESS  4.  Pt will negotiate curb, ramp, outdoor surfaces with appropriate device and no LOB for safety with community gait. Baseline:  Goal status: IN PROGRESS   ASSESSMENT:  CLINICAL IMPRESSION: Patient was progressed with multiple familiar interventions for improved muscular strength and endurance. He was educated on healing following his lumbar surgery and how this relates to his right foot drop. He reported understanding. He reported feeling good upon the conclusion of treatment. He continues to require skilled physical therapy to address his remaining impairments to maximize his functional mobility.   OBJECTIVE IMPAIRMENTS: Abnormal gait, decreased balance, decreased mobility, difficulty walking, decreased ROM, decreased strength, impaired flexibility, and postural  dysfunction.   ACTIVITY LIMITATIONS: bending, standing, squatting, transfers, and locomotion level  PARTICIPATION LIMITATIONS: shopping, community activity, and yard work  PERSONAL FACTORS: 3+ comorbidities: see above  are also affecting patient's functional outcome.   REHAB POTENTIAL: Good  CLINICAL DECISION MAKING: Stable/uncomplicated  EVALUATION COMPLEXITY: Low  PLAN:  PT FREQUENCY: 2x/week for 4 weeks, then 1x/wk for 4 weeks  PT DURATION: other: 9 weeks, including eval session  PLANNED INTERVENTIONS: 97110-Therapeutic exercises, 97530- Therapeutic activity, 97112- Neuromuscular re-education, 97535- Self Care, 29562- Manual therapy, [id number]- Gait training, [phone]- Orthotic Fit/training, Balance training, and DME instructions  PLAN FOR NEXT SESSION: Review updates to HEP and progress for flexibility, strengthening, balance.  Back pain is limiting prolonged standing, so more fully assess back pain and ways to help him stand longer without pain.  Lane Pinon, PT 02/06/2024, 3:26 PM

## 2024-02-08 ENCOUNTER — Ambulatory Visit

## 2024-02-08 DIAGNOSIS — M6281 Muscle weakness (generalized): Secondary | ICD-10-CM

## 2024-02-08 DIAGNOSIS — R2689 Other abnormalities of gait and mobility: Secondary | ICD-10-CM

## 2024-02-08 DIAGNOSIS — R2681 Unsteadiness on feet: Secondary | ICD-10-CM

## 2024-02-08 NOTE — Therapy (Signed)
 OUTPATIENT PHYSICAL THERAPY NEURO TREATMENT NOTE   Patient Name: Mitchell Fields MRN: [id number] DOB:1936-06-05, 88 y.o., male Today's Date: 02/08/2024   PCP: Jeannine Milroy., MD REFERRING PROVIDER: Jeannine Milroy., MD  END OF SESSION:  PT End of Session - 02/08/24 1522     Visit Number 9    Number of Visits 13    Date for PT Re-Evaluation 02/23/24    Authorization Type [employer] Medicare- approved 13 PT visits from 12/28/2023-02/29/2024    Authorization Time Period auth submitted    Progress Note Due on Visit 10    PT Start Time 1515    PT Stop Time 1600    PT Time Calculation (min) 45 min    Activity Tolerance Patient tolerated treatment well    Behavior During Therapy [facility] for tasks assessed/performed                  Past Medical History:  Diagnosis Date   Anemia    Arthritis    Back pain    Bursitis of left hip    Coronary artery disease    TWO VESSEL BYPASS SURGERY in January 1995   Diverticulosis    Hyperlipidemia    Hypertension    Monoclonal gammopathy    Neuropathy    Past Surgical History:  Procedure Laterality Date   ADENOIDECTOMY     BACK SURGERY  1985, 1995, 2002   X3   COLONOSCOPY     CORONARY ARTERY BYPASS GRAFT     X2. LIMA GRAFT AND A RIMA GRAFT.   INGUINAL HERNIA REPAIR Left    Negative stress echo  08/11/2009   US  ECHOCARDIOGRAPHY  June 2012   Normal EF, mild aortic regurgitation, mild MR, LAE, mildly dilated RV, RAE, increased left atrial pressure, moderate to severe TR and mildly increased  PA systolic pressure.    Patient Active Problem List   Diagnosis Date Noted   Trochanteric bursitis, right hip 08/23/2021   CKD (chronic kidney disease), stage III (HCC) 04/01/2021   Positive anti-CCP test 11/24/2020   Hand deformity 11/24/2020   Pulmonary fibrosis (HCC) 08/24/2020   Diverticulosis of colon 08/24/2020   Melena 08/14/2020   Cough in adult 08/14/2020   Left rotator cuff tear arthropathy 09/16/2019   Other fatigue  03/06/2019   Alcoholic peripheral neuropathy (HCC) 03/12/2018   Sciatica, right side 10/11/2017   Acute pain of right shoulder 03/02/2017   Impingement syndrome of right shoulder 03/02/2017   Anemia due to chronic illness 03/13/2014   Monoclonal gammopathy    Lung nodules 03/11/2013   Weight loss, non-intentional 01/28/2013   CAD (coronary artery disease) 08/03/2011   HTN (hypertension) 08/03/2011   Hyperlipidemia 08/03/2011   Valvular heart disease 08/03/2011    ONSET DATE: 12/27/2023 (MD referral)  REFERRING DIAG:  M21.371 (ICD-10-CM) - Foot drop, right foot   THERAPY DIAG:  Muscle weakness (generalized)  Unsteadiness on feet  Other abnormalities of gait and mobility  Rationale for Evaluation and Treatment: Rehabilitation  SUBJECTIVE:  SUBJECTIVE STATEMENT: Patient stated his back feels a little better today. He reported 2/10 pain at start of treatment.   Pt accompanied by: self  PERTINENT HISTORY: Lumbar radiculopathy status post lumbar surgery, foot drop (11/2023); COPD, CAD, hypothyroidism, peripheral neuropathy  PAIN:  Are you having pain? Yes: NPRS scale: no pain score provided Pain location: low back Pain description: sore, weak Aggravating factors: walking, standing or bending over Relieving factors: sitting, lying down  PRECAUTIONS: Fall and Other: back precautions lifted 12/26/2023  RED FLAGS: None   WEIGHT BEARING RESTRICTIONS: No  FALLS: Has patient fallen in last 6 months? Yes. Number of falls 8 (prior to surgery)  LIVING ENVIRONMENT: Lives with: lives with their spouse Lives in: House/apartment Stairs: 3 steps to enter Has following equipment at home: Single point cane and R AFO  PLOF: Independent and Leisure: enjoys going to weekly exercises at [employer]    PATIENT GOALS: Pt's goals for PT are to help to walk and get better.  OBJECTIVE:    ------------------------------------- Note: Objective measures were completed at Evaluation unless otherwise noted.  DIAGNOSTIC FINDINGS: MRI Lumbar spine, 08/2023:  IMPRESSION: 1. Mild interval enlargement of a chronic L1-2 disc extrusion with severe spinal and right lateral recess stenosis. 2. Unchanged severe spinal stenosis at L2-3 and L4-5. 3. Unchanged multilevel neural foraminal stenosis, most severe on the right at L3-4 and L4-5.    COGNITION: Overall cognitive status: Within functional limits for tasks assessed   SENSATION: Light touch: Impaired  and does reports some tingling due to neuropathy  COORDINATION: Decreased RLE  POSTURE: rounded shoulders  LOWER EXTREMITY ROM:   passive R ankle dorsiflexion to neutral  Active  Right Eval Left Eval  Hip flexion    Hip extension    Hip abduction    Hip adduction    Hip internal rotation    Hip external rotation    Knee flexion    Knee extension    Ankle dorsiflexion No active WFL  Ankle plantarflexion    Ankle inversion    Ankle eversion WFL    (Blank rows = not tested)  LOWER EXTREMITY MMT:    MMT Right Eval Left Eval  Hip flexion 3+ 4  Hip extension    Hip abduction    Hip adduction    Hip internal rotation    Hip external rotation    Knee flexion 4 5  Knee extension 5 5  Ankle dorsiflexion 1 3+  Ankle plantarflexion    Ankle inversion    Ankle eversion    (Blank rows = not tested)   TRANSFERS: Assistive device utilized: None , requires UE support Sit to stand: Modified independence Stand to sit: Modified independence  GAIT: Gait pattern: step through pattern, decreased step length- Right, decreased step length- Left, decreased stance time- Right, decreased ankle dorsiflexion- Right, and Right steppage Distance walked: 50 ft x 2 Assistive device utilized: Single point cane Level of assistance: SBA and  CGA Comments: decreased foot clearance at times with RLE; has AFO, but did not wear to PT session today  FUNCTIONAL TESTS:  Timed up and go (TUG): 21.4 sec 10 meter walk test: 13.84 sec (2.37 ft/sec)  TREATMENT DATE:                                       02/08/24 EXERCISE LOG   Exercise Repetitions and Resistance Comments  Nustep  Level 4 16 minutes   TE  Seated ball push outs  4 sets of 15 reps #2 medicine ball   TA  Heel raises   4 minutes  TA  Supine Toe raises AAROM   4 minutes  NR  Supine Clams  36 reps  NR focus on control and bracing core   Abdominal bracing   3 sets of 8 reps   NR   Blank cell = exercise not performed today                                     02/06/24 EXERCISE LOG  Exercise Repetitions and Resistance Comments  Nustep  L4 x 15.5 minutes   LAQ  2# x 25 reps each    Rocker board (seated) 3.5 minutes    Seated HS curl  Red t-band x 25 reps each             Blank cell = exercise not performed today    02/01/24                                 EXERCISE LOG  Exercise Repetitions and Resistance Comments  Nustep Lvl 4 x 15 mins   [employer] 3 mins   Ball Rollout 3 mins   Rows Red x 25 reps   Pulldowns Red x 25 reps   LAQs 2# x 20 reps bil   Seated Marches 2# x 20 reps bil   Seated Ham Curls Red x 20 reps bil    Blank cell = exercise not performed today   PATIENT EDUCATION: Education details: anatomy and healing Person educated: Patient Education method: Explanation Education comprehension: verbalized understanding  HOME EXERCISE PROGRAM: Access Code: [email or web address] URL: [email or web address] Date: 12/28/2023 Prepared by: [facility] - Outpatient  Rehab - [facility]  Exercises - Seated Hamstring Stretch with Chair  - 1 x daily - 7 x weekly - 1 sets - 3 reps - 15-30 sec hold - Standing Gastroc Stretch at Counter   - 1-2 x daily - 7 x weekly - 1 sets - 3 reps - 15-30 sec hold - Sit to stand in stride stance  - 1-2 x daily - 7 x weekly - 3 sets - 5 reps  GOALS: Goals reviewed with patient? Yes  SHORT TERM GOALS: Target date: 01/26/2024  Pt will be independent with HEP for improved strength, flexibility, balance, gait. Baseline: Goal status: IN PROGRESS  2.  Pt will demo improved flexibility in R ankle dorsiflexion by 10 degrees for improved gait and step negotiation. Baseline: neutral (passive) Goal status: IN PROGRESS  3.  Pt will improve TUG score to less than or equal to 15 sec for decreased fall risk.  Baseline: 21 sec Goal status: IN PROGRESS   LONG TERM GOALS: Target date: 02/23/24  Pt will be independent with progression of HEP for improved balance, flexibility, strength, gait. Baseline:  Goal status: IN PROGRESS  2.  Pt will improve gait velocity to at least 2.62 ft/sec for improved gait efficiency and  safety. Baseline: 2.37 ft/sec Goal status: IN PROGRESS  3.  FTSTS score to be tested, with pt to perform without UE support in <15 sec. Baseline: pt requires UE support Goal status: IN PROGRESS  4.  Pt will negotiate curb, ramp, outdoor surfaces with appropriate device and no LOB for safety with community gait. Baseline:  Goal status: IN PROGRESS   ASSESSMENT:  CLINICAL IMPRESSION:  The patient responded well to treatment. He was introduced to functional core and postural exercises and demonstrated proper form and reported no increase in pain. He was provided verbal and tactile cueing with abdominal bracing, ball push outs, supine clams and toe raises to ensure proper muscle engagement and body mechanics. His HEP was discussed and he reported that the exercises he was provided with at his last treatment session were helping his back pain. He reported no increase in pain upon completion of treatment session. Patient will continue to benefit from skilled PT in order address  impairments and decrease pain in order to improve his ability to perform ADL's.   OBJECTIVE IMPAIRMENTS: Abnormal gait, decreased balance, decreased mobility, difficulty walking, decreased ROM, decreased strength, impaired flexibility, and postural dysfunction.   ACTIVITY LIMITATIONS: bending, standing, squatting, transfers, and locomotion level  PARTICIPATION LIMITATIONS: shopping, community activity, and yard work  PERSONAL FACTORS: 3+ comorbidities: see above are also affecting patient's functional outcome.   REHAB POTENTIAL: Good  CLINICAL DECISION MAKING: Stable/uncomplicated  EVALUATION COMPLEXITY: Low  PLAN:  PT FREQUENCY: 2x/week for 4 weeks, then 1x/wk for 4 weeks  PT DURATION: other: 9 weeks, including eval session  PLANNED INTERVENTIONS: 97110-Therapeutic exercises, 97530- Therapeutic activity, 97112- Neuromuscular re-education, 97535- Self Care, [id number]- Manual therapy, [phone]- Gait training, [phone]- Orthotic Fit/training, Balance training, and DME instructions  PLAN FOR NEXT SESSION: Nustep, Continue core strengthening, continue leg muscle strengthening exercises, introduce back strengthening and balance exercises, modalities as needed, Back pain is limiting prolonged standing, so more fully assess back pain and ways to help him stand longer without pain.  Odean Fester, Student-PT 02/08/2024, 5:41 PM

## 2024-02-13 ENCOUNTER — Ambulatory Visit

## 2024-02-13 DIAGNOSIS — R2681 Unsteadiness on feet: Secondary | ICD-10-CM

## 2024-02-13 DIAGNOSIS — M6281 Muscle weakness (generalized): Secondary | ICD-10-CM | POA: Diagnosis not present

## 2024-02-13 DIAGNOSIS — R2689 Other abnormalities of gait and mobility: Secondary | ICD-10-CM

## 2024-02-13 NOTE — Therapy (Addendum)
 OUTPATIENT PHYSICAL THERAPY NEURO TREATMENT NOTE   Patient Name: Mitchell Fields MRN: [id number] DOB:[birth year], 88 y.o., male Today's Date: 02/13/2024   PCP: Jeannine Milroy., MD REFERRING PROVIDER: Jeannine Milroy., MD  END OF SESSION:  PT End of Session - 02/13/24 1301     Visit Number 10    Number of Visits 13    Date for PT Re-Evaluation 02/23/24    Authorization Type [employer] Medicare- approved 13 PT visits from 12/28/2023-02/29/2024    Authorization Time Period auth submitted    Progress Note Due on Visit 10    PT Start Time 1300    PT Stop Time 1339    PT Time Calculation (min) 39 min    Activity Tolerance Patient tolerated treatment well    Behavior During Therapy [facility] for tasks assessed/performed                  Past Medical History:  Diagnosis Date   Anemia    Arthritis    Back pain    Bursitis of left hip    Coronary artery disease    TWO VESSEL BYPASS SURGERY in January 1995   Diverticulosis    Hyperlipidemia    Hypertension    Monoclonal gammopathy    Neuropathy    Past Surgical History:  Procedure Laterality Date   ADENOIDECTOMY     BACK SURGERY  1985, 1995, 2002   X3   COLONOSCOPY     CORONARY ARTERY BYPASS GRAFT     X2. LIMA GRAFT AND A RIMA GRAFT.   INGUINAL HERNIA REPAIR Left    Negative stress echo  08/11/2009   US  ECHOCARDIOGRAPHY  June 2012   Normal EF, mild aortic regurgitation, mild MR, LAE, mildly dilated RV, RAE, increased left atrial pressure, moderate to severe TR and mildly increased  PA systolic pressure.    Patient Active Problem List   Diagnosis Date Noted   Trochanteric bursitis, right hip 08/23/2021   CKD (chronic kidney disease), stage III (HCC) 04/01/2021   Positive anti-CCP test 11/24/2020   Hand deformity 11/24/2020   Pulmonary fibrosis (HCC) 08/24/2020   Diverticulosis of colon 08/24/2020   Melena 08/14/2020   Cough in adult 08/14/2020   Left rotator cuff tear arthropathy 09/16/2019   Other fatigue  03/06/2019   Alcoholic peripheral neuropathy (HCC) 03/12/2018   Sciatica, right side 10/11/2017   Acute pain of right shoulder 03/02/2017   Impingement syndrome of right shoulder 03/02/2017   Anemia due to chronic illness 03/13/2014   Monoclonal gammopathy    Lung nodules 03/11/2013   Weight loss, non-intentional 01/28/2013   CAD (coronary artery disease) 08/03/2011   HTN (hypertension) 08/03/2011   Hyperlipidemia 08/03/2011   Valvular heart disease 08/03/2011    ONSET DATE: 12/27/2023 (MD referral)  REFERRING DIAG:  M21.371 (ICD-10-CM) - Foot drop, right foot   THERAPY DIAG:  Muscle weakness (generalized)  Unsteadiness on feet  Other abnormalities of gait and mobility  Rationale for Evaluation and Treatment: Rehabilitation  SUBJECTIVE:  SUBJECTIVE STATEMENT: Pt reports 6-7/10 low back pain today.  Pt states he took pain relievers prior to coming to today's treatment session.  Pt accompanied by: self  PERTINENT HISTORY: Lumbar radiculopathy status post lumbar surgery, foot drop (11/2023); COPD, CAD, hypothyroidism, peripheral neuropathy  PAIN:  Are you having pain? Yes: NPRS scale: 6-7/10 Pain location: low back Pain description: sore, weak Aggravating factors: walking, standing or bending over Relieving factors: sitting, lying down  PRECAUTIONS: Fall and Other: back precautions lifted 12/26/2023  RED FLAGS: None   WEIGHT BEARING RESTRICTIONS: No  FALLS: Has patient fallen in last 6 months? Yes. Number of falls 8 (prior to surgery)  LIVING ENVIRONMENT: Lives with: lives with their spouse Lives in: House/apartment Stairs: 3 steps to enter Has following equipment at home: Single point cane and R AFO  PLOF: Independent and Leisure: enjoys going to weekly exercises at [employer]    PATIENT GOALS: Pt's goals for PT are to help to walk and get better.  OBJECTIVE:    ------------------------------------- Note: Objective measures were completed at Evaluation unless otherwise noted.  DIAGNOSTIC FINDINGS: MRI Lumbar spine, 08/2023:  IMPRESSION: 1. Mild interval enlargement of a chronic L1-2 disc extrusion with severe spinal and right lateral recess stenosis. 2. Unchanged severe spinal stenosis at L2-3 and L4-5. 3. Unchanged multilevel neural foraminal stenosis, most severe on the right at L3-4 and L4-5.    COGNITION: Overall cognitive status: Within functional limits for tasks assessed   SENSATION: Light touch: Impaired  and does reports some tingling due to neuropathy  COORDINATION: Decreased RLE  POSTURE: rounded shoulders  LOWER EXTREMITY ROM:   passive R ankle dorsiflexion to neutral  Active  Right Eval Left Eval  Hip flexion    Hip extension    Hip abduction    Hip adduction    Hip internal rotation    Hip external rotation    Knee flexion    Knee extension    Ankle dorsiflexion No active WFL  Ankle plantarflexion    Ankle inversion    Ankle eversion WFL    (Blank rows = not tested)  LOWER EXTREMITY MMT:    MMT Right Eval Left Eval  Hip flexion 3+ 4  Hip extension    Hip abduction    Hip adduction    Hip internal rotation    Hip external rotation    Knee flexion 4 5  Knee extension 5 5  Ankle dorsiflexion 1 3+  Ankle plantarflexion    Ankle inversion    Ankle eversion    (Blank rows = not tested)   TRANSFERS: Assistive device utilized: None , requires UE support Sit to stand: Modified independence Stand to sit: Modified independence  GAIT: Gait pattern: step through pattern, decreased step length- Right, decreased step length- Left, decreased stance time- Right, decreased ankle dorsiflexion- Right, and Right steppage Distance walked: 50 ft x 2 Assistive device utilized: Single point cane Level of assistance: SBA and  CGA Comments: decreased foot clearance at times with RLE; has AFO, but did not wear to PT session today  FUNCTIONAL TESTS:  Timed up and go (TUG): 21.4 sec 10 meter walk test: 13.84 sec (2.37 ft/sec)  TREATMENT DATE:    02/13/24    EXERCISE LOG  Exercise Repetitions and Resistance Comments  Nustep  L4 x 15.5 minutes   LAQ  2# x 25 reps each    Rocker board (seated)    Seated HS curl  Red t-band x 25 reps each    Seated Hip Abduction Red x 3 mins   Seated Hip Adduction 3 mins    Blank cell = exercise not performed today   02/08/24    EXERCISE LOG   Exercise Repetitions and Resistance Comments  Nustep  Level 4 16 minutes   TE  Seated ball push outs  4 sets of 15 reps #2 medicine ball   TA  Heel raises   4 minutes  TA  Supine Toe raises AAROM   4 minutes  NR  Supine Clams  36 reps  NR focus on control and bracing core   Abdominal bracing   3 sets of 8 reps   NR   Blank cell = exercise not performed today                                     02/06/24 EXERCISE LOG  Exercise Repetitions and Resistance Comments  Nustep  L4 x 15.5 minutes   LAQ  2# x 25 reps each    Rocker board (seated) 3.5 minutes    Seated HS curl  Red t-band x 25 reps each             Blank cell = exercise not performed today     PATIENT EDUCATION: Education details: anatomy and healing Person educated: Patient Education method: Explanation Education comprehension: verbalized understanding  HOME EXERCISE PROGRAM: Access Code: [email or web address] URL: [email or web address] Date: 12/28/2023 Prepared by: [facility] - Outpatient  Rehab - [facility]  Exercises - Seated Hamstring Stretch with Chair  - 1 x daily - 7 x weekly - 1 sets - 3 reps - 15-30 sec hold - Standing Gastroc Stretch at Counter  - 1-2 x daily - 7 x weekly - 1 sets - 3 reps - 15-30 sec hold - Sit to stand in  stride stance  - 1-2 x daily - 7 x weekly - 3 sets - 5 reps  GOALS: Goals reviewed with patient? Yes  SHORT TERM GOALS: Target date: 01/26/2024  Pt will be independent with HEP for improved strength, flexibility, balance, gait. Baseline: Goal status: MET  2.  Pt will demo improved flexibility in R ankle dorsiflexion by 10 degrees for improved gait and step negotiation. Baseline: neutral (passive); 5/13: 2 degrees Goal status: IN PROGRESS  3.  Pt will improve TUG score to less than or equal to 15 sec for decreased fall risk.    Baseline: 21 sec; 5/13: 14.28 seconds Goal status: MET   LONG TERM GOALS: Target date: 02/23/24  Pt will be independent with progression of HEP for improved balance, flexibility, strength, gait. Baseline:  Goal status: IN PROGRESS  2.  Pt will improve gait velocity to at least 2.62 ft/sec for improved gait efficiency and safety. Baseline: 2.37 ft/sec Goal status: IN PROGRESS  3.  FTSTS score to be tested, with pt to perform without UE support in <15 sec. Baseline: pt requires UE support: 5/13: 22.5 seconds Goal status: IN PROGRESS  4.  Pt will negotiate curb, ramp, outdoor surfaces with appropriate device and no LOB for safety with community gait. Baseline:  Goal  status: PARTIALLY MET   ASSESSMENT:  CLINICAL IMPRESSION:  Pt arrives for today's treatment session reporting 6-7/10 low back pain.  Pt states that he took pain medication prior to today's treatment session and it has not yet "kicked in."  Pt able to perform TUG in 14.28 seconds, meeting his STG.  Pt also reports performing his HEP at home regularly and denies any questions or concerns.  Pt also states that he feels comfortable and safe when navigating curbs, ramps, and outdoor surfaces when using his [employer] and is taking his time.  Pt requiring 22.51 seconds to perform 5 STS test and continues to require use of BUEs.  Today's treatment session concentrating on seated exercises due to increased  fatigue and low back pain.  Pt denies in change in pain at completion of treatment session, but does report increased fatigue.   02/13/24 PROGRESS REPORT:  Patient is making fair progress with skilled physical therapy as evidenced by his functional mobility, objective measures, and progress toward his goals. He was able to meet most of his short term goals for physical therapy. However, he continues to exhibit limited right ankle AROM and gait speed. Recommend that he continue with his current plan of care to address his remaining impairments to maximize his functional mobility.   Glendora Landsman, PT, DPT   OBJECTIVE IMPAIRMENTS: Abnormal gait, decreased balance, decreased mobility, difficulty walking, decreased ROM, decreased strength, impaired flexibility, and postural dysfunction.   ACTIVITY LIMITATIONS: bending, standing, squatting, transfers, and locomotion level  PARTICIPATION LIMITATIONS: shopping, community activity, and yard work  PERSONAL FACTORS: 3+ comorbidities: see above are also affecting patient's functional outcome.   REHAB POTENTIAL: Good  CLINICAL DECISION MAKING: Stable/uncomplicated  EVALUATION COMPLEXITY: Low  PLAN:  PT FREQUENCY: 2x/week for 4 weeks, then 1x/wk for 4 weeks  PT DURATION: other: 9 weeks, including eval session  PLANNED INTERVENTIONS: 97110-Therapeutic exercises, 97530- Therapeutic activity, 97112- Neuromuscular re-education, 97535- Self Care, 16109- Manual therapy, [phone]- Gait training, [phone]- Orthotic Fit/training, Balance training, and DME instructions  PLAN FOR NEXT SESSION: Nustep, Continue core strengthening, continue leg muscle strengthening exercises, introduce back strengthening and balance exercises, modalities as needed, Back pain is limiting prolonged standing, so more fully assess back pain and ways to help him stand longer without pain.  Deryl Flora, PTA 02/13/2024, 2:24 PM

## 2024-02-15 ENCOUNTER — Encounter

## 2024-04-02 ENCOUNTER — Encounter: Payer: Self-pay | Admitting: Physical Therapy

## 2024-04-02 ENCOUNTER — Other Ambulatory Visit: Payer: Self-pay

## 2024-04-02 ENCOUNTER — Ambulatory Visit: Attending: Neurosurgery | Admitting: Physical Therapy

## 2024-04-02 DIAGNOSIS — M62830 Muscle spasm of back: Secondary | ICD-10-CM | POA: Insufficient documentation

## 2024-04-02 DIAGNOSIS — R293 Abnormal posture: Secondary | ICD-10-CM | POA: Diagnosis present

## 2024-04-02 DIAGNOSIS — M5459 Other low back pain: Secondary | ICD-10-CM | POA: Diagnosis present

## 2024-04-02 NOTE — Therapy (Signed)
 OUTPATIENT PHYSICAL THERAPY THORACOLUMBAR EVALUATION   Patient Name: Mitchell Fields MRN: [id number] DOB:[birth year], 88 y.o., male Today's Date: 04/02/2024  END OF SESSION:  PT End of Session - 04/02/24 1338     Visit Number 1    Number of Visits 12    Date for PT Re-Evaluation 05/14/24    PT Start Time 0104          Past Medical History:  Diagnosis Date   Anemia    Arthritis    Back pain    Bursitis of left hip    Coronary artery disease    TWO VESSEL BYPASS SURGERY in January 1995   Diverticulosis    Hyperlipidemia    Hypertension    Monoclonal gammopathy    Neuropathy    Past Surgical History:  Procedure Laterality Date   ADENOIDECTOMY     BACK SURGERY  1985, 1995, 2002   X3   COLONOSCOPY     CORONARY ARTERY BYPASS GRAFT     X2. LIMA GRAFT AND A RIMA GRAFT.   INGUINAL HERNIA REPAIR Left    Negative stress echo  08/11/2009   US  ECHOCARDIOGRAPHY  June 2012   Normal EF, mild aortic regurgitation, mild MR, LAE, mildly dilated RV, RAE, increased left atrial pressure, moderate to severe TR and mildly increased  PA systolic pressure.    Patient Active Problem List   Diagnosis Date Noted   Trochanteric bursitis, right hip 08/23/2021   CKD (chronic kidney disease), stage III (HCC) 04/01/2021   Positive anti-CCP test 11/24/2020   Hand deformity 11/24/2020   Pulmonary fibrosis (HCC) 08/24/2020   Diverticulosis of colon 08/24/2020   Melena 08/14/2020   Cough in adult 08/14/2020   Left rotator cuff tear arthropathy 09/16/2019   Other fatigue 03/06/2019   Alcoholic peripheral neuropathy (HCC) 03/12/2018   Sciatica, right side 10/11/2017   Acute pain of right shoulder 03/02/2017   Impingement syndrome of right shoulder 03/02/2017   Anemia due to chronic illness 03/13/2014   Monoclonal gammopathy    Lung nodules 03/11/2013   Weight loss, non-intentional 01/28/2013   CAD (coronary artery disease) 08/03/2011   HTN (hypertension) 08/03/2011   Hyperlipidemia 08/03/2011    Valvular heart disease 08/03/2011   REFERRING PROVIDER: Toribio Lathe MD  REFERRING DIAG: Lumbar radiculopathy.  Rationale for Evaluation and Treatment: Rehabilitation  THERAPY DIAG:  Other low back pain  Abnormal posture  Muscle spasm of back  ONSET DATE: Gradual.  SUBJECTIVE:  SUBJECTIVE STATEMENT: The patient presents to the clinic with a CC of low back pain.  He reports his pain was on both sides but using a TENS unit has been very helpful and he reports his pain, today, is on the left side only.  His pain-level today is a 3-4/10 but can rise to 7-8/10 with movement, walking and standing.  He states he sleeps comfortably and sitting decreases his back pain.    PERTINENT HISTORY:  4 lumbar surgeries, right foot drop.    PAIN:  Are you having pain? Yes: NPRS scale: 3-4/10. Pain location: Left low back. Pain description: Ache. Aggravating factors: As above. Relieving factors: As above.    PRECAUTIONS: Fall.  One fall since surgery without cane.  Recommend cane usage at all times.  He did not have it with him today and not wearing right AFO due to sore on tibia that has been there for quite some time.    RED FLAGS: None   WEIGHT BEARING RESTRICTIONS: No  FALLS:  Has patient fallen in last 6 months? Yes. Number of falls See above.  LIVING ENVIRONMENT: Lives with: lives with their spouse Lives in: House/apartment Stairs: 3 steps to enter. Has following equipment at home: Cane and right AFO.  OCCUPATION: Retired.    PLOF: Independent with basic ADLs and Independent with household mobility with device  PATIENT GOALS: Would like to decrease back pain.     OBJECTIVE:    PATIENT SURVEYS:  Modified Owestry:  22/50.     POSTURE: rounded shoulders, forward head, decreased  lumbar lordosis, increased thoracic kyphosis, and scoliosis.  Left LE 1/4 inch+ longer than right corrected with reverse muscle energy technique.    PALPATION: Bilateral lumbar musculature remarkable for a great deal of tone and tender to palpation on left and over upper gluteal musculature.    LUMBAR ROM:   50% decrees in active lumbar flexion and extension to 5 degrees.    GAIT: Steppage gait due to right foot drop.    TREATMENT DATE: 04/02/24:  IFC at 80-150 Hz on 40% scan x  20 minutes to patient's left lower lumbar region.       Normal modality response following removal of modality.   He stated he felt better after treatment.                                                                                                                         PATIENT EDUCATION:  Education details:  Person educated:  International aid/development worker:  Education comprehension:   HOME EXERCISE PROGRAM:   ASSESSMENT:  CLINICAL IMPRESSION: The patient presents to OPPT with c/o left-sided low back pain.  He reports his right side is not hurting which he attributes to using a TENS unit.  He has right foot drop.  Recommended he use a cane at all times which he did not have with him today.  His bilateral lumbar musculature is remarkable for a great deal of tone and tender  to palpation on left and over upper gluteal musculature.  He was found to have a leg length discrepancy which was corrected with a reverse muscle energy technique.  His Modified Owestry score is 22/50.  Patient will benefit from skilled physical therapy intervention to address pain and deficits.  OBJECTIVE IMPAIRMENTS: Abnormal gait, decreased activity tolerance, difficulty walking, decreased strength, increased muscle spasms, postural dysfunction, and pain.   ACTIVITY LIMITATIONS: carrying, lifting, bending, standing, and locomotion level  PARTICIPATION LIMITATIONS: meal prep, cleaning, laundry, community activity, and yard work  PERSONAL  FACTORS: Time since onset of injury/illness/exacerbation and 1 comorbidity: multiple prior lumbar surgeries are also affecting patient's functional outcome.   REHAB POTENTIAL: Good  CLINICAL DECISION MAKING: Stable/uncomplicated  EVALUATION COMPLEXITY: Low   GOALS:  SHORT TERM GOALS: Target date: 04/16/24  Ind with an initial HEP. Goal status: INITIAL   LONG TERM GOALS: Target date: 05/14/24  Ind with an advanced HEP.  Goal status: INITIAL  2.  Perform ADL's with pain not > 3/10.  Goal status: INITIAL  3.  Improve Modified Owestry score by 9 points.  Goal status: INITIAL  PLAN:  PT FREQUENCY: 2x/week  PT DURATION: 6 weeks  PLANNED INTERVENTIONS: 97110-Therapeutic exercises, 97530- Therapeutic activity, [id number]- Neuromuscular re-education, 97535- Self Care, [id number]- Manual therapy, [id number]- Electrical stimulation (unattended), 97035- Ultrasound, 79439 (1-2 muscles), 20561 (3+ muscles)- Dry Needling, Patient/Family education, and Moist heat.  PLAN FOR NEXT SESSION: Combo e'stim/US , STW/M, SKTC, hip bridge.  Core exercise progression.     Bay Wayson, [place], PT 04/02/2024, 2:17 PM

## 2024-04-09 ENCOUNTER — Encounter: Admitting: Physical Therapy

## 2024-04-11 ENCOUNTER — Ambulatory Visit
Admission: RE | Admit: 2024-04-11 | Discharge: 2024-04-11 | Disposition: A | Discharge status: Home / Self Care | Source: Ambulatory Visit | Attending: Internal Medicine | Admitting: Internal Medicine

## 2024-04-11 ENCOUNTER — Other Ambulatory Visit: Payer: Self-pay | Admitting: Internal Medicine

## 2024-04-11 ENCOUNTER — Encounter: Admitting: Physical Therapy

## 2024-04-11 DIAGNOSIS — R4182 Altered mental status, unspecified: Secondary | ICD-10-CM

## 2024-04-12 ENCOUNTER — Other Ambulatory Visit: Payer: Self-pay | Admitting: Internal Medicine

## 2024-04-12 DIAGNOSIS — I619 Nontraumatic intracerebral hemorrhage, unspecified: Secondary | ICD-10-CM

## 2024-04-15 ENCOUNTER — Ambulatory Visit
Admission: RE | Admit: 2024-04-15 | Discharge: 2024-04-15 | Disposition: A | Discharge status: Home / Self Care | Source: Ambulatory Visit | Attending: Internal Medicine | Admitting: Internal Medicine

## 2024-04-15 DIAGNOSIS — I619 Nontraumatic intracerebral hemorrhage, unspecified: Secondary | ICD-10-CM

## 2024-04-15 MED ORDER — GADOPICLENOL 0.5 MMOL/ML IV SOLN
7.0000 mL | Freq: Once | INTRAVENOUS | Status: AC | PRN
  Administered 2024-04-15: 7 mL via INTRAVENOUS

## 2024-04-16 ENCOUNTER — Encounter

## 2024-04-18 ENCOUNTER — Encounter: Admitting: Physical Therapy

## 2024-04-22 ENCOUNTER — Ambulatory Visit: Admitting: Orthopaedic Surgery

## 2024-04-23 ENCOUNTER — Encounter: Admitting: Physical Therapy

## 2024-04-24 ENCOUNTER — Encounter: Payer: Self-pay | Admitting: Diagnostic Neuroimaging

## 2024-04-24 ENCOUNTER — Ambulatory Visit: Admitting: Diagnostic Neuroimaging

## 2024-04-24 VITALS — BP 120/72 | HR 80 | Ht 69.0 in | Wt 132.8 lb

## 2024-04-24 DIAGNOSIS — I63412 Cerebral infarction due to embolism of left middle cerebral artery: Secondary | ICD-10-CM

## 2024-04-24 DIAGNOSIS — I639 Cerebral infarction, unspecified: Secondary | ICD-10-CM | POA: Diagnosis not present

## 2024-04-24 NOTE — Patient Instructions (Signed)
  LEFT BRAIN WATERSHED vs EMBOLIC STROKES - check CTA head / neck - check echocardiogram, cardiac monitor - continue plavix, atorvastatin, zetia

## 2024-04-24 NOTE — Progress Notes (Signed)
 [facility]  PATIENT: Mitchell Fields DOB: 11/25/1935  REFERRING CLINICIAN: Loreli Elsie JONETTA Fields., MD HISTORY FROM: patient  REASON FOR VISIT: new consult   HISTORICAL  CHIEF COMPLAINT:  Chief Complaint  Patient presents with   New Patient (Initial Visit)    RM 7, Pt w/wife. Pt referred by PCP d/t stroke. Pt ambulates w/single point cane and reports doing so prior to the stroke. Pt had back surgery in Feb 2025 and has used the cane since. Pt reports weight loss in the last year - about 20 lbs.    HISTORY OF PRESENT ILLNESS:   88 year old male here for evaluation of stroke.  History of coronary artery disease status post CABG 1995, EF 45 to 50%, hypertension, hyperlipidemia, interstitial lung disease, left carotid stenosis.  04/07/2024 patient was in the shower getting ready for church when all of a sudden he felt confused and not well.  He got out of the shower and told his wife that he did not feel well.  His wife is a retired Engineer, civil (consulting).  She checked patient for signs of numbness, weakness, language difficulties but did not see any of the signs.  He decided to lay down to go to sleep instead.  Patient followed up with PCP on 04/09/2024 for evaluation of lightheadedness and fatigue.  Patient had lab testing and CT scan of the head ordered.  Abnormalities on CT of the head raise possibility of acute to subacute stroke.  This was followed up with MRI of the brain which confirmed left frontal and parietal watershed ischemic infarctions, acute to subacute in timing.  Referred here for further evaluation.  Patient previously on aspirin 81 mg/day.  PCP changed to Plavix 75 mg/day.  Has history of lumbar spine degenerative disease, status post multiple surgeries with residual right foot drop weakness.    REVIEW OF SYSTEMS: Full 14 system review of systems performed and negative with exception of: As per HPI.  ALLERGIES: Allergies  Allergen Reactions   Cortisone Other (See  Comments)    REACTION: shut adrenal glands down if given too much    HOME MEDICATIONS: Outpatient Medications Prior to Visit  Medication Sig Dispense Refill   atorvastatin (LIPITOR) 20 MG tablet Take 20 mg by mouth daily.     Cholecalciferol (VITAMIN D PO) Take 2,000 Units by mouth daily.     clopidogrel (PLAVIX) 75 MG tablet Take 75 mg by mouth daily.     ezetimibe (ZETIA) 10 MG tablet Take 1 tablet by mouth daily.     gabapentin (NEURONTIN) 300 MG capsule Take 300 mg by mouth daily as needed.     HYDROcodone -acetaminophen  (NORCO) 7.5-325 MG tablet Take 1 tablet by mouth 2 (two) times daily as needed for moderate pain. (Patient taking differently: Take 0.5 tablets by mouth 2 (two) times daily as needed for moderate pain (pain score 4-6).) 20 tablet 0   isosorbide  mononitrate (IMDUR ) 30 MG 24 hr tablet Take 0.5 tablets (15 mg total) by mouth daily. 90 tablet 3   metoprolol succinate (TOPROL-XL) 25 MG 24 hr tablet Take 12.5 mg by mouth daily.     Multiple Vitamins-Minerals (ZINC PO) Take by mouth daily.     nitroGLYCERIN  (NITROSTAT ) 0.4 MG SL tablet Place 1 tablet (0.4 mg total) under the tongue every 5 (five) minutes as needed for chest pain. 90 tablet 3   pregabalin (LYRICA) 75 MG capsule as needed.     SYNTHROID 75 MCG tablet Take 75 mcg by mouth daily before breakfast.  Misc Natural Products (GLUCOSAMINE CHOND COMPLEX/MSM) TABS take 1 po qd (Patient not taking: Reported on 04/24/2024)     amoxicillin -clavulanate (AUGMENTIN ) 875-125 MG tablet Take 1 tablet by mouth 2 (two) times daily. 14 tablet 0   aspirin 81 MG tablet Take 81 mg by mouth daily.     No facility-administered medications prior to visit.    PAST MEDICAL HISTORY: Past Medical History:  Diagnosis Date   Anemia    Arthritis    Back pain    Bursitis of left hip    Coronary artery disease    TWO VESSEL BYPASS SURGERY in January 1995   Diverticulosis    Hyperlipidemia    Hypertension    Monoclonal gammopathy     Neuropathy     PAST SURGICAL HISTORY: Past Surgical History:  Procedure Laterality Date   ADENOIDECTOMY     BACK SURGERY  1985, 1995, 2002   X3   BACK SURGERY  11/2023   COLONOSCOPY     CORONARY ARTERY BYPASS GRAFT     X2. LIMA GRAFT AND A RIMA GRAFT.   INGUINAL HERNIA REPAIR Left    Negative stress echo  08/11/2009   US  ECHOCARDIOGRAPHY  03/2011   Normal EF, mild aortic regurgitation, mild MR, LAE, mildly dilated RV, RAE, increased left atrial pressure, moderate to severe TR and mildly increased  PA systolic pressure.     FAMILY HISTORY: Family History  Problem Relation Age of Onset   Lung cancer Father 68       smoker   Diabetes Father    Cancer Sister        breast in one; bone cancer in other    SOCIAL HISTORY: Social History   Socioeconomic History   Marital status: Married    Spouse name: Not on file   Number of children: 2   Years of education: Not on file   Highest education level: Not on file  Occupational History   Occupation: retired    Associate Professor: RETIRED    Comment: Forensic scientist.  [employer]  Tobacco Use   Smoking status: Former    Current packs/day: 0.00    Average packs/day: 2.0 packs/day for 5.0 years (10.0 ttl pk-yrs)    Types: Cigarettes    Start date: 10/03/1962    Quit date: 10/04/1967    Years since quitting: 56.5   Smokeless tobacco: Never  Vaping Use   Vaping status: Never Used  Substance and Sexual Activity   Alcohol use: Yes    Alcohol/week: 2.0 standard drinks of alcohol    Types: 2 Shots of liquor per week    Comment: only on occasion   Drug use: No   Sexual activity: Not Currently  Other Topics Concern   Not on file  Social History Narrative   Lives w/wife   Social Drivers of Health   Financial Resource Strain: Not on file  Food Insecurity: Not on file  Transportation Needs: Not on file  Physical Activity: Not on file  Stress: Not on file  Social Connections: Not on file  Intimate Partner Violence: Not on file      PHYSICAL EXAM  GENERAL EXAM/CONSTITUTIONAL: Vitals:  Vitals:   04/24/24 1202  BP: 120/72  Pulse: 80  Weight: 132 lb 12.8 oz (60.2 kg)  Height: 5' 9 (1.753 m)   Body mass index is 19.61 kg/m. Wt Readings from Last 3 Encounters:  04/24/24 132 lb 12.8 oz (60.2 kg)  10/31/23 150 lb 9.6 oz (68.3 kg)  10/28/23 151  lb 14.4 oz (68.9 kg)   Patient is in no distress; well developed, nourished and groomed; neck is supple  CARDIOVASCULAR: Examination of carotid arteries is normal; no carotid bruits Regular rate and rhythm, no murmurs Examination of peripheral vascular system by observation and palpation is normal  EYES: Ophthalmoscopic exam of optic discs and posterior segments is normal; no papilledema or hemorrhages No results found.  MUSCULOSKELETAL: Gait, strength, tone, movements noted in Neurologic exam below  NEUROLOGIC: MENTAL STATUS:      No data to display         awake, alert, oriented to person, place and time recent and remote memory intact normal attention and concentration language fluent, comprehension intact, naming intact fund of knowledge appropriate  CRANIAL NERVE:  2nd - no papilledema on fundoscopic exam 2nd, 3rd, 4th, 6th - pupils equal and reactive to light, visual fields full to confrontation, extraocular muscles intact, no nystagmus 5th - facial sensation symmetric 7th - facial strength symmetric 8th - hearing intact 9th - palate elevates symmetrically, uvula midline 11th - shoulder shrug symmetric 12th - tongue protrusion midline  MOTOR:  normal bulk and tone, full strength in the BUE, BLE; EXCEPT RIGHT FOOT DF 3  SENSORY:  normal and symmetric to light touch, temperature, vibration  COORDINATION:  finger-nose-finger, fine finger movements normal  REFLEXES:  deep tendon reflexes TRACE and symmetric  GAIT/STATION:  narrow based gait     DIAGNOSTIC DATA (LABS, IMAGING, TESTING) - I reviewed patient records, labs, notes,  testing and imaging myself where available.  Lab Results  Component Value Date   WBC 9.3 10/28/2023   HGB 10.4 (L) 10/28/2023   HCT 31.1 (L) 10/28/2023   MCV 92.6 10/28/2023   PLT 242 10/28/2023      Component Value Date/Time   NA 138 10/28/2023 1228   NA 135 (L) 03/23/2017 1316   K 3.9 10/28/2023 1228   K 4.5 03/23/2017 1316   CL 100 10/28/2023 1228   CL 94 (L) 03/20/2013 1510   CO2 30 10/28/2023 1228   CO2 26 03/23/2017 1316   GLUCOSE 150 (H) 10/28/2023 1228   GLUCOSE 103 03/23/2017 1316   GLUCOSE 122 (H) 03/20/2013 1510   BUN 18 10/28/2023 1228   BUN 17.8 03/23/2017 1316   CREATININE 1.08 10/28/2023 1228   CREATININE 1.11 10/21/2022 1305   CREATININE 1.3 03/23/2017 1316   CALCIUM 8.6 (L) 10/28/2023 1228   CALCIUM 9.6 03/23/2017 1316   PROT 6.7 10/28/2023 1228   PROT 6.5 03/23/2017 1317   PROT 6.8 03/23/2017 1316   ALBUMIN 3.8 10/28/2023 1228   ALBUMIN 3.8 03/23/2017 1316   AST 15 10/28/2023 1228   AST 21 10/21/2022 1305   AST 29 03/23/2017 1316   ALT 15 10/28/2023 1228   ALT 25 10/21/2022 1305   ALT 28 03/23/2017 1316   ALKPHOS 51 10/28/2023 1228   ALKPHOS 59 03/23/2017 1316   BILITOT 0.6 10/28/2023 1228   BILITOT 0.7 10/21/2022 1305   BILITOT 1.00 03/23/2017 1316   GFRNONAA >60 10/28/2023 1228   GFRNONAA >60 10/21/2022 1305   GFRAA >60 03/23/2020 1333   Lab Results  Component Value Date   CHOL 178 03/23/2020   HDL 54 03/23/2020   LDLCALC 96 03/23/2020   TRIG 142 03/23/2020   CHOLHDL 3.3 03/23/2020   No results found for: HGBA1C No results found for: VITAMINB12 Lab Results  Component Value Date   TSH 3.814 03/21/2019    04/19/23 carotid u/s Right Carotid: Velocities in the  right ICA are consistent with a 1-39%  stenosis. Non-hemodynamically significant plaque <50% noted in the CCA.   Left Carotid: Velocities in the left ICA are consistent with a 40-59%  stenosis. Non-hemodynamically significant plaque <50% noted in the CCA.   Vertebrals:  Bilateral vertebral arteries demonstrate antegrade flow.  Subclavians: Normal flow hemodynamics were seen in bilateral subclavian               arteries.   04/15/24 MRI brain [I reviewed images myself and agree with interpretation. -VRP]  1. Scattered foci of diffusion signal abnormality involving the cortical to subcortical aspect of the left cerebral hemisphere, consistent with evolving subacute ischemic infarcts, largely left MCA and/or watershed in territory. Mild associated petechial blood products at the level of the operculum without hemorrhagic transformation or significant regional mass effect. 2. Underlying age-related cerebral atrophy with moderately advanced chronic microvascular ischemic disease.    ASSESSMENT AND PLAN  88 y.o. year old male here with:   Dx:  1. Cerebrovascular accident (CVA) due to embolism of left middle cerebral artery (HCC)   2. Cerebral ischemic stroke due to global hypoperfusion with watershed infarct [facility])     PLAN:  CONFUSION / MALAISE EVENT on 04/07/24 --> acute-subacute left brain ischemic infarctions (watershed territory due to left ICA stenosis versus embolic phenomenon) - Reviewed symptoms, test results, differential diagnosis and causes of stroke with the patient and spouse; will complete stroke workup and encouraged patient to continue medical management with PCP and cardiology - check CTA head / neck - check echocardiogram, cardiac monitor - continue plavix, atorvastatin, zetia  Orders Placed This Encounter  Procedures   CT ANGIO HEAD W OR WO CONTRAST   CT ANGIO NECK W OR WO CONTRAST   CARDIAC EVENT MONITOR   ECHOCARDIOGRAM COMPLETE BUBBLE STUDY   Return for pending test results, pending if symptoms worsen or fail to improve.  I reviewed images, labs, notes, records myself. I summarized findings and reviewed with patient, for this high risk condition (acute-subacute stroke) requiring high complexity decision making.    EDUARD FABIENE HANLON, MD 04/24/2024, 12:48 PM Certified in Neurology, Neurophysiology and Neuroimaging  [facility] [address], [address] [place], [place] [address] [phone]

## 2024-04-25 ENCOUNTER — Telehealth: Payer: Self-pay | Admitting: Diagnostic Neuroimaging

## 2024-04-25 ENCOUNTER — Encounter: Admitting: *Deleted

## 2024-04-25 NOTE — Telephone Encounter (Signed)
Noted, and updated.

## 2024-04-25 NOTE — Telephone Encounter (Signed)
 Mitchell Fields from [facility] called stating that a PA is needed for Pt to be seen . For ECHO Bubble test . Mitchell Fields  stated you can teams her or send thru EPIC , But That PA is needed  asap.  Callback number is [phone]

## 2024-04-25 NOTE — Addendum Note (Signed)
 Addended by: ROBBERT GOSLING D on: 04/25/2024 11:57 AM   Modules accepted: Orders

## 2024-04-25 NOTE — Telephone Encounter (Signed)
Can you look into this please.  Thanks.

## 2024-04-30 ENCOUNTER — Encounter: Admitting: Physical Therapy

## 2024-04-30 ENCOUNTER — Ambulatory Visit
Admission: RE | Admit: 2024-04-30 | Discharge: 2024-04-30 | Disposition: A | Discharge status: Home / Self Care | Source: Ambulatory Visit | Attending: Diagnostic Neuroimaging | Admitting: Diagnostic Neuroimaging

## 2024-04-30 DIAGNOSIS — I639 Cerebral infarction, unspecified: Secondary | ICD-10-CM

## 2024-04-30 DIAGNOSIS — I63412 Cerebral infarction due to embolism of left middle cerebral artery: Secondary | ICD-10-CM

## 2024-04-30 MED ORDER — IOPAMIDOL (ISOVUE-370) INJECTION 76%
75.0000 mL | Freq: Once | INTRAVENOUS | Status: AC | PRN
  Administered 2024-04-30: 75 mL via INTRAVENOUS

## 2024-05-02 ENCOUNTER — Encounter: Admitting: Physical Therapy

## 2024-05-09 ENCOUNTER — Ambulatory Visit: Payer: Self-pay | Admitting: Diagnostic Neuroimaging

## 2024-05-21 ENCOUNTER — Telehealth: Payer: Self-pay

## 2024-05-21 NOTE — Telephone Encounter (Signed)
   Cardiac Monitor Alert  Date of alert:  05/21/2024   Patient Name: Mitchell Fields  DOB: [birth year]  MRN: [id number]   [facility] Cardiologist: Peter Swaziland, MD   [facility] EP:  None    Monitor Information: Cardiac Event Monitor [[employer]]  Reason:  Cerebral infarction Ordering provider:  Dr. Eduard Hanlon (Neurology)  :1}  Alert 2nd degree AV Block, Type II This is the 1st alert for this rhythm.   Next Cardiology Appointment   Date:  9/12 @ 11:30am  Provider:  Dr. Eduard Hanlon (Neurology)   The patient was contacted today.  He is asymptomatic. Arrhythmia, symptoms and history reviewed with Dr. Elmira.   Plan:  Continue to follow, needs return office visit with Dr. Swaziland or APP shortly after monitor results.  Pt scheduled to see Lum Louis, NP on 9/9 at 2:20pm. Pt verbalizes understanding.  Other:   Ambree Frances L, RN  05/21/2024 8:11 AM

## 2024-05-22 ENCOUNTER — Telehealth: Payer: Self-pay

## 2024-05-22 NOTE — Telephone Encounter (Signed)
   Cardiac Monitor Alert  Date of alert:  05/22/2024   Patient Name: Mitchell Fields  DOB: 03-29-1936  MRN: [id number]   [facility] Cardiologist: Peter Swaziland, MD  [facility] EP:  None    Monitor Information: Cardiac Event Monitor [[employer]]  Reason:  Left brain watershed vs Embolic strokes  Ordering provider:  Dr. Margaret   Alert 2nd degree AV Block, Type I Bradycardia - slowest HR: 41 on 05/21/24 at 7:14 pm, HR 38 on 05/21/24 at 8:17 pm, HR 40 on 05/21/24 at 9:09 pm This is the 4th alert for this rhythm.   Next Cardiology Appointment   Date:  06/11/24  Provider:  Lum Louis NP  The patient could NOT be reached by telephone today.  Left message for patient to call back. Arrhythmia, symptoms and history reviewed with DOD, Dr. Verlin will wait for patient to call back.   Plan: Awaiting patient to call back.  Other:   Sharlet Drena Martinis, RN  05/22/2024 9:12 AM

## 2024-05-22 NOTE — Telephone Encounter (Signed)
 Patient called back. Patient stated during the time of events, he had no symptoms. DOD, Dr. Verlin recommends no changes at this time and continue to monitor. Will send message to his neurologist and Lum Louis NP who will see him next month.

## 2024-05-24 ENCOUNTER — Encounter: Payer: Self-pay | Admitting: Cardiology

## 2024-05-27 ENCOUNTER — Telehealth: Payer: Self-pay | Admitting: Cardiology

## 2024-05-27 ENCOUNTER — Telehealth: Payer: Self-pay | Admitting: *Deleted

## 2024-05-27 NOTE — Telephone Encounter (Addendum)
   Cardiac Monitor Alert  Date of alert:  05/27/2024   Patient Name: Mitchell Fields  DOB: Nov 23, 1935  MRN: [id number]   [facility] Cardiologist: Peter Swaziland, MD  [facility] EP:  None    Monitor Information: Cardiac Event Monitor [[employer]]  Reason:  CVA Ordering provider:  Eduard Hanlon MD   Alert 2nd degree AV Block, Type I This is the 5th alert for this rhythm.   Next Cardiology Appointment   Date:  06/11/24  Provider:  Lum Louis NP  The patient was contacted today.  He is asymptomatic. Arrhythmia, symptoms and history reviewed with Dr Michele DOD Plan: patient was asleep at the time, consider sleep apnea workup.   Other:   Adrien Conquest, RN  05/27/2024 8:42 AM

## 2024-05-27 NOTE — Addendum Note (Signed)
 Addended by: VICCI ROXIE CROME on: 05/27/2024 11:22 AM   Modules accepted: Orders

## 2024-05-27 NOTE — Telephone Encounter (Signed)
 Nothing further needed at this time. Patient was already made aware to discontinue his metoprolol.

## 2024-05-27 NOTE — Telephone Encounter (Signed)
 Received monitor alert this morning. Reviewed with DOD Dr. Anner: recommend discontinuing metoprolol. Medication list updated. Patient verbalized understanding.  Monitor alert to be scanned into chart.

## 2024-05-27 NOTE — Telephone Encounter (Signed)
 Pt called in stating he was returning another call. I see he spoke to nurse previously. Not sure if any needed to speak with him again. Please advise.

## 2024-05-28 ENCOUNTER — Encounter: Payer: Self-pay | Admitting: Cardiology

## 2024-05-30 ENCOUNTER — Ambulatory Visit (HOSPITAL_COMMUNITY)
Admission: RE | Admit: 2024-05-30 | Discharge: 2024-05-30 | Disposition: A | Discharge status: Home / Self Care | Source: Ambulatory Visit | Attending: Cardiology | Admitting: Cardiology

## 2024-05-30 ENCOUNTER — Ambulatory Visit: Admitting: Pulmonary Disease

## 2024-05-30 ENCOUNTER — Telehealth: Payer: Self-pay | Admitting: *Deleted

## 2024-05-30 ENCOUNTER — Encounter: Payer: Self-pay | Admitting: Pulmonary Disease

## 2024-05-30 VITALS — BP 122/66 | HR 43 | Temp 97.6°F | Ht 69.0 in | Wt 272.0 lb

## 2024-05-30 DIAGNOSIS — R0689 Other abnormalities of breathing: Secondary | ICD-10-CM

## 2024-05-30 DIAGNOSIS — J84112 Idiopathic pulmonary fibrosis: Secondary | ICD-10-CM | POA: Diagnosis not present

## 2024-05-30 DIAGNOSIS — R0609 Other forms of dyspnea: Secondary | ICD-10-CM

## 2024-05-30 DIAGNOSIS — I63412 Cerebral infarction due to embolism of left middle cerebral artery: Secondary | ICD-10-CM | POA: Insufficient documentation

## 2024-05-30 DIAGNOSIS — I639 Cerebral infarction, unspecified: Secondary | ICD-10-CM | POA: Diagnosis present

## 2024-05-30 DIAGNOSIS — Z5181 Encounter for therapeutic drug level monitoring: Secondary | ICD-10-CM | POA: Diagnosis not present

## 2024-05-30 LAB — COMPREHENSIVE METABOLIC PANEL WITH GFR
ALT: 19 U/L (ref 0–53)
AST: 16 U/L (ref 0–37)
Albumin: 4 g/dL (ref 3.5–5.2)
Alkaline Phosphatase: 50 U/L (ref 39–117)
BUN: 27 mg/dL — ABNORMAL HIGH (ref 6–23)
CO2: 29 meq/L (ref 19–32)
Calcium: 8.4 mg/dL (ref 8.4–10.5)
Chloride: 98 meq/L (ref 96–112)
Creatinine, Ser: 1.22 mg/dL (ref 0.40–1.50)
GFR: 53.96 mL/min — ABNORMAL LOW (ref >60.00)
Glucose, Bld: 79 mg/dL (ref 70–99)
Potassium: 4.2 meq/L (ref 3.5–5.1)
Sodium: 137 meq/L (ref 135–145)
Total Bilirubin: 0.7 mg/dL (ref 0.2–1.2)
Total Protein: 7 g/dL (ref 6.0–8.3)

## 2024-05-30 LAB — BRAIN NATRIURETIC PEPTIDE: Pro B Natriuretic peptide (BNP): 725 pg/mL — ABNORMAL HIGH (ref 0.0–100.0)

## 2024-05-30 LAB — CBC WITH DIFFERENTIAL/PLATELET
Basophils Absolute: 0 K/uL (ref 0.0–0.1)
Basophils Relative: 0.4 % (ref 0.0–3.0)
Eosinophils Absolute: 0.2 K/uL (ref 0.0–0.7)
Eosinophils Relative: 3 % (ref 0.0–5.0)
HCT: 33.1 % — ABNORMAL LOW (ref 39.0–52.0)
Hemoglobin: 11.4 g/dL — ABNORMAL LOW (ref 13.0–17.0)
Lymphocytes Relative: 20.3 % (ref 12.0–46.0)
Lymphs Abs: 1.4 K/uL (ref 0.7–4.0)
MCHC: 34.4 g/dL (ref 30.0–36.0)
MCV: 89.3 fl (ref 78.0–100.0)
Monocytes Absolute: 0.6 K/uL (ref 0.1–1.0)
Monocytes Relative: 9.2 % (ref 3.0–12.0)
Neutro Abs: 4.7 K/uL (ref 1.4–7.7)
Neutrophils Relative %: 67.1 % (ref 43.0–77.0)
Platelets: 295 K/uL (ref 150.0–400.0)
RBC: 3.71 Mil/uL — ABNORMAL LOW (ref 4.22–5.81)
RDW: 14.1 % (ref 11.5–15.5)
WBC: 6.9 K/uL (ref 4.0–10.5)

## 2024-05-30 LAB — ECHOCARDIOGRAM COMPLETE BUBBLE STUDY
AR max vel: 1.78 cm2
AV Area VTI: 2.22 cm2
AV Area mean vel: 1.94 cm2
AV Mean grad: 10 mmHg
AV Peak grad: 19.7 mmHg
Ao pk vel: 2.22 m/s
Area-P 1/2: 6.37 cm2
S' Lateral: 2.94 cm

## 2024-05-30 LAB — D-DIMER, QUANTITATIVE: D-Dimer, Quant: 0.54 ug{FEU}/mL — ABNORMAL HIGH (ref <0.50)

## 2024-05-30 NOTE — Telephone Encounter (Signed)
 Left message to call back (Reviewed with Dr. Swaziland. When pt calls back -- if asymptomatic then no changes)     Cardiac Monitor Alert  Date of alert:  05/30/2024   Patient Name: Mitchell Fields  DOB: [birth year]  MRN: [id number]   [facility] Cardiologist: Peter Swaziland, MD  [facility] EP:  None    Monitor Information: Cardiac Event Monitor [[employer]]  Reason:  Stroke Ordering provider:  [facility]   Alert 05/29/2024 @ 7:48 pm 2nd degree AV Block, Type I Bradycardia - slowest HR: 41 This is the 4th alert for this rhythm.    Next Cardiology Appointment   Date:  06/11/2024  Provider:  Rana   The patient could NOT be reached by telephone today.  Left message to call back Arrhythmia, symptoms and history reviewed with Dr. Swaziland.   Plan:     Gretel Maeola CROME, RN  05/30/2024 8:21 AM

## 2024-05-30 NOTE — Progress Notes (Signed)
 Montario Zilka    [id number]    [birth year]  Primary Care Physician:Shaw, [name], MD  Referring Physician: Loreli [name], MD [address] [place],  [place] [address]  Chief complaint:  Follow-up for interstitial lung disease, pulmonary fibrosis Did not tolerate Esbriet  August 2023 Did not tolerate Ofev  October 2023 Acute visit for dyspnea  HPI: 88 y.o.  with history of monoclonal gammopathy, hypertension, hyperlipidemia, coronary artery disease Referred for evaluation of pulmonary fibrosis noted on recent CT chest  He follows with Dr. Lonn for monoclonal gammopathy and had a CT chest abdomen pelvis for evaluation of unexplained weight loss.  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   Chief complaint is fatigue, chronic dyspnea on exertion which is worsening gradually over the past few years. He had a consultation with Dr. Jeannetta, rheumatology for elevated CCP.  No evidence of rheumatoid arthritis noted  Started Esbriet  in April 2023.  He had issues with high blood pressure and fatigue with the medication. We gave a break from the medication and earlier this year but he had recurrence of symptoms.  Finally stopped taking it and end of August 2023  Interim history:  Discussed the use of AI scribe software for clinical note transcription with the patient, who gave verbal consent to proceed.  History of Present Illness Merdith Boyd is an 88 year old male with idiopathic pulmonary fibrosis who presents with worsening shortness of breath.  Dyspnea and functional limitation - Worsening shortness of breath over the past 2-3 weeks - Breathing described as 'much more hard' - Unable to walk far without becoming breathless - No cough or congestion - Very immobile due to fatigue and low energy - No lower extremity swelling  Idiopathic pulmonary fibrosis - Diagnosed with idiopathic pulmonary fibrosis - Previously treated with antifibrotics,  discontinued in 2023 due to significant weight loss (from 153 to 128 pounds) - Not currently on medication for IPF  Cerebrovascular disease - Left MCA ischemic stroke on July, 2025 - No loss of consciousness during stroke - CT angiogram showed 30% carotid artery stenosis, not significant for surgical intervention  Cardiac rhythm evaluation - Currently wearing a Holter monitor to evaluate for arrhythmia or atrial fibrillation as possible contributors to stroke  Orthostatic symptoms and blood pressure - Low blood pressure reported  Recent surgical history - Back surgery in February 2025, prior to stroke   Relevant pulmonary history 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  No mold, hot tub, Jacuzzi.  No feather pillows or comforters ILD questionnaire 01/01/2021-negative except as noted above Smoking history: 10-pack-year smoker.  Quit in 1969 Travel history: Previously lived in [place] and [place]. [place].  No significant recent travel Relevant family history: Dad had lung cancer.  He was a smoker.  Outpatient Encounter Medications as of 05/30/2024  Medication Sig   atorvastatin (LIPITOR) 20 MG tablet Take 20 mg by mouth daily.   Cholecalciferol (VITAMIN D PO) Take 2,000 Units by mouth daily.   clopidogrel (PLAVIX) 75 MG tablet Take 75 mg by mouth daily.   ezetimibe (ZETIA) 10 MG tablet Take 1 tablet by mouth daily.   gabapentin (NEURONTIN) 300 MG capsule Take 300 mg by mouth daily as needed.   HYDROcodone -acetaminophen  (NORCO) 7.5-325 MG tablet Take 1 tablet by mouth 2 (two) times daily as needed for moderate pain.  isosorbide  mononitrate (IMDUR ) 30 MG 24 hr tablet Take 0.5 tablets (15 mg total) by mouth daily.   metoprolol succinate (TOPROL-XL) 25 MG 24 hr tablet 1/2 tab Orally Once a day; Duration: 90 days   Misc Natural Products (GLUCOSAMINE CHOND  COMPLEX/MSM) TABS take 1 po qd   Multiple Vitamins-Minerals (ZINC PO) Take by mouth daily.   nitroGLYCERIN  (NITROSTAT ) 0.4 MG SL tablet Place 1 tablet (0.4 mg total) under the tongue every 5 (five) minutes as needed for chest pain.   pregabalin (LYRICA) 75 MG capsule as needed.   SYNTHROID 75 MCG tablet Take 75 mcg by mouth daily before breakfast.    Vitamin E 180 MG (400 UNIT) CAPS 1 capsule.   No facility-administered encounter medications on file as of 05/30/2024.   Vitals:   05/30/24 1012  BP: 122/66  Pulse: (!) 43  Temp: 97.6 F (36.4 C)  Height: 5' 9 (1.753 m)  Weight: 272 lb (123.4 kg)  SpO2: 98%  TempSrc: Oral  BMI (Calculated): 40.15     Physical Exam GEN: No acute distress. CV: Regular rate and rhythm, no murmurs. LUNGS: Crackles present, otherwise clear to auscultation bilaterally with normal respiratory effort. SKIN JOINTS: Warm and dry, no rash.    Data Reviewed: Imaging: CT chest 03/20/2015-emphysema, subpleural reticulation CT chest 08/21/2020-basilar predominant fibrotic ILD, probable UIP pattern.  Progressive since 2016.  Diverticulosis in the abdomen, atherosclerosis. CT high-resolution 11/26/2021-ILD and probable UIP pattern.  Stable compared to 2020 I have reviewed the images personally.  PFTs: 01/01/2021 FVC 3.38 [89%], FEV1 2.43 [91%], F/F 72, TLC 6.07 [88%], DLCO 12.61 [53%] Moderate-severe diffusion defect  12/13/2021 FVC 3.23 (86%), FEV1 2.31 (88%), F/F 72, TLC 6.12 (89%), DLCO 12.07 (51%) Moderately severe Diffusion Defect Lung volumes are stable but diffusion capacity is slightly down compared to April 2022  Labs: CTD serologies 10/08/2020-significant for CCP 120. Rest of the labs are negative  Cardiac: Echocardiogram 09/01/2020 LVEF 45-50%, mild concentric LVH, grade 1 diastolic dysfunction, moderately elevated PA systolic pressure, RVSP 46.4  Assessment & Plan Progressive dyspnea under evaluation Progressive dyspnea over the last two to  three weeks without cough or congestion. Differential diagnosis includes worsening idiopathic pulmonary fibrosis, pulmonary hypertension, acute infection, thromboembolism, or cardiac issues. Currently wearing a Holter monitor to evaluate for cardiac causes related to previous stroke. Recent physical exam reveals crackles, indicating possible pulmonary fibrosis exacerbation. - Order lab tests including D-dimer to evaluate for thromboembolism - If D-dimer is positive, order CTA to rule out pulmonary embolism - If D-dimer is negative, order High res CT to assess lung scarring - Order echocardiogram to evaluate cardiac function and pulmonary HTN - Order pulmonary function tests - Schedule follow-up appointment in two to three months  Idiopathic pulmonary fibrosis Progressive in nature with probable UIP pattern.  This is likely IPF There is no evidence of connective tissue disease though CCP is markedly elevated Reviewed rheumatology appointment report  He has tried Esbriet  but stopped due to palpitations, hypertension and fatigue.   Ofev  was also poorly tolerated due to fatigue and weight loss and he did not want any further treatment with antifibrotic's  Current examination reveals crackles, suggesting progression. - Monitor symptoms and reassess after diagnostic tests for dyspnea  Plan/Recommendations: CMP, CBC, D-dimer, BNP Follow-up lung imaging Echocardiogram, PFTs  Lonna Coder MD [facility] 05/30/2024, 10:32 AM  CC: Loreli Elsie JONETTA Mickey., MD

## 2024-05-30 NOTE — Patient Instructions (Signed)
  VISIT SUMMARY: Today, we discussed your worsening shortness of breath and reviewed your history of idiopathic pulmonary fibrosis, cerebrovascular disease, and recent back surgery. We also evaluated your current symptoms and planned further diagnostic tests to determine the cause of your increased breathlessness.  YOUR PLAN: -PROGRESSIVE DYSPNEA: Progressive dyspnea means that your shortness of breath has been getting worse over time. We will conduct several tests to find out the cause, including lab tests to check for blood clots, a CT scan if needed, an echocardiogram to check your heart, and pulmonary function tests to assess your lung function.  -IDIOPATHIC PULMONARY FIBROSIS: Idiopathic pulmonary fibrosis is a lung disease that causes scarring of the lungs, making it hard to breathe. We will monitor your symptoms and reassess after the diagnostic tests for your shortness of breath.  INSTRUCTIONS: Please complete the lab tests as soon as possible. If the D-dimer test is positive, we will schedule a CT scan to rule out a pulmonary embolism. If it is negative, we will schedule a CT scan to assess lung scarring. Additionally, we will arrange an echocardiogram and pulmonary function tests. Please schedule a follow-up appointment in two to three months to review the results and discuss the next steps.

## 2024-06-05 ENCOUNTER — Telehealth: Payer: Self-pay | Admitting: Diagnostic Neuroimaging

## 2024-06-05 ENCOUNTER — Ambulatory Visit: Payer: Self-pay | Admitting: Pulmonary Disease

## 2024-06-05 ENCOUNTER — Ambulatory Visit: Attending: Diagnostic Neuroimaging

## 2024-06-05 DIAGNOSIS — I63412 Cerebral infarction due to embolism of left middle cerebral artery: Secondary | ICD-10-CM | POA: Diagnosis not present

## 2024-06-05 DIAGNOSIS — I639 Cerebral infarction, unspecified: Secondary | ICD-10-CM

## 2024-06-05 NOTE — Telephone Encounter (Signed)
Patient called to verify appointment date and time

## 2024-06-06 ENCOUNTER — Ambulatory Visit
Admission: RE | Admit: 2024-06-06 | Discharge: 2024-06-06 | Disposition: A | Discharge status: Home / Self Care | Source: Ambulatory Visit | Attending: Pulmonary Disease | Admitting: Pulmonary Disease

## 2024-06-06 DIAGNOSIS — R0600 Dyspnea, unspecified: Secondary | ICD-10-CM

## 2024-06-06 DIAGNOSIS — J84112 Idiopathic pulmonary fibrosis: Secondary | ICD-10-CM

## 2024-06-06 NOTE — Telephone Encounter (Signed)
 Spoke with pt, he was not aware of his heart rate getting low. He is concerned about the results of the BNP. Aware results of echo forwarded to Dr Theophilus. Patient voiced understanding to let us  know if he has any fainting or passing out.

## 2024-06-10 ENCOUNTER — Encounter: Payer: Self-pay | Admitting: *Deleted

## 2024-06-11 ENCOUNTER — Encounter: Payer: Self-pay | Admitting: Cardiology

## 2024-06-11 ENCOUNTER — Encounter: Payer: Self-pay | Admitting: Emergency Medicine

## 2024-06-11 ENCOUNTER — Ambulatory Visit: Attending: Emergency Medicine | Admitting: Emergency Medicine

## 2024-06-11 VITALS — BP 120/69 | HR 53 | Ht 69.0 in | Wt 135.0 lb

## 2024-06-11 DIAGNOSIS — E785 Hyperlipidemia, unspecified: Secondary | ICD-10-CM | POA: Diagnosis not present

## 2024-06-11 DIAGNOSIS — I10 Essential (primary) hypertension: Secondary | ICD-10-CM

## 2024-06-11 DIAGNOSIS — I25118 Atherosclerotic heart disease of native coronary artery with other forms of angina pectoris: Secondary | ICD-10-CM | POA: Diagnosis not present

## 2024-06-11 DIAGNOSIS — I5032 Chronic diastolic (congestive) heart failure: Secondary | ICD-10-CM

## 2024-06-11 DIAGNOSIS — Z951 Presence of aortocoronary bypass graft: Secondary | ICD-10-CM

## 2024-06-11 DIAGNOSIS — R001 Bradycardia, unspecified: Secondary | ICD-10-CM

## 2024-06-11 DIAGNOSIS — I63412 Cerebral infarction due to embolism of left middle cerebral artery: Secondary | ICD-10-CM | POA: Diagnosis not present

## 2024-06-11 NOTE — Patient Instructions (Signed)
 Medication Instructions:  Discontinue Metoprolol *If you need a refill on your cardiac medications before your next appointment, please call your pharmacy*  Lab Work: CBC, BMET, BNP If you have labs (blood work) drawn today and your tests are completely normal, you will receive your results only by: MyChart Message (if you have MyChart) OR A paper copy in the mail If you have any lab test that is abnormal or we need to change your treatment, we will call you to review the results.   Follow-Up: At [facility], you and your health needs are our priority.  As part of our continuing mission to provide you with exceptional heart care, our providers are all part of one team.  This team includes your primary Cardiologist (physician) and Advanced Practice Providers or APPs (Physician Assistants and Nurse Practitioners) who all work together to provide you with the care you need, when you need it.  Your next appointment:   3 month(s)  Provider:   Peter [place]az[place]and, MD

## 2024-06-11 NOTE — Progress Notes (Signed)
 " Cardiology Office Note:    Date:  06/13/2024  ID:  Mitchell Fields, DOB 1936-08-18, MRN [id number] PCP: Loreli Elsie JONETTA Mickey., MD  [facility] Providers Cardiologist:  Peter Jordan, MD       Patient Profile:       Chief Complaint: Acute visit post CVA History of Present Illness:  Mitchell Fields is a 88 y.o. male with visit-pertinent history of coronary artery disease s/p CABG, hypertension, hyperlipidemia, monoclonal gammopathy, IPF, anemia of chronic disease  He has known history of remote MI, CABG in 48 in [place]. Mitchell Fields (LIMA and RIMA grafts).  Echo June 2012 with normal EF, mild aortic regurgitation, mild MR, mild to moderate LAE, moderately dilated RV, moderate RAE, moderate to severe TR and redundant atrial septum consistent with increased left atrial pressure.  Echocardiogram 2014 with mild LV dysfunction EF 45-50%, mild AI/MR.  Was seen in clinic on November 2021 for fatigue.  He had been transition from atenolol to Toprol due to decreased LVEF repeat echo was unchanged.  He had been diagnosed with IPF via CT and was following with Dr. Theophilus. Tried on Esbriet  but unable to tolerate due to marked fatigue.   He was seen on 01/31/2022 with 2 episodes of chest pain.  Cardiac PET scan ordered and performed on 03/2022 revealing prior MR and mild peri-infarct ischemia.  It was moderate high risk and presence of reduced LVEF and infarction size.  LVEF 44%.  Optimization of antianginal therapy was recommended.  Isosorbide  was added.  Since he denied any chest pain.  He also wore a ZIO monitor which showed brief nonsustained runs of SVT and frequent PACs and PVCs.  These were all asymptomatic.  He was last seen in clinic on 01/12/2023 he was doing well at the time without acute complaints.  He was to follow-up in 6 months.  On 04/07/2024 patient was in the shower getting ready for church when all of a sudden he felt confused and not well.  He got out of the shower and told his wife that he did not  feel well.  His wife is a retired engineer, civil (consulting).  She checked patient for signs of numbness, weakness, language difficulties but did not see any of the signs.  He decided to lay down to go to sleep instead. Patient followed up with PCP on 04/09/2024 for evaluation of lightheadedness and fatigue.  Patient had lab testing and CT scan of the head ordered.  Abnormalities on CT of the head raise possibility of acute to subacute stroke.  This was followed up with MRI of the brain which confirmed left frontal and parietal watershed ischemic infarctions, acute to subacute in timing.  He was referred to neurology for further evaluation.   He underwent echo bubble study and cardiac event monitor.  He underwent echo completed bubble study on 05/30/2024 showing LVEF 60 to 65%, no RWMA, mild LVH of the basal septal segment, RV function is normal, RV size mildly enlarged, mildly elevated PASP, mild mitral valve regurgitation, mild calcification of aortic valve, mild aortic valve regurgitation, saline contrast bubble study was negative. He underwent cardiac event monitor due to stroke showing normal sinus rhythm, periods of 2:1 AV block and complete heart block with bradycardia into the 30s, no prolonged pauses, rare PVCs, 4 beat run of NSVT, occasional PACs, no atrial fibrillation seen.  His metoprolol was discontinued on 05/27/2024.    Discussed the use of AI scribe software for clinical note transcription with the patient, who gave verbal  consent to proceed.  History of Present Illness Mitchell Fields is an 88 year old male who presents after recent CVA.  Today patient presents to clinic with his wife.  He notes significant fatigue, shortness of breath, and difficulty with physical activities since his CVA.  He denies any major neurological deficits poststroke and no new neurological deficits.  Otherwise he denies any chest pains, orthopnea, PND, leg swelling.  He has actually been losing some weight since CVA.  Review of  systems:  Please see the history of present illness. All other systems are reviewed and otherwise negative.      Studies Reviewed:        Cardiac event monitor 06/05/2024   Normal sinus rhythm   Periods of 2:1 AV block and complete heart  block with bradycardia into the 30s. no prolonged pauses   Rare PVCs. 4 beat run NSVT   Occasional PACs.   No Afib seen.  Echocardiogram complete bubble study 05/30/2024 1. Left ventricular ejection fraction, by estimation, is 60 to 65%. The  left ventricle has normal function. The left ventricle has no regional  wall motion abnormalities. There is mild left ventricular hypertrophy of  the basal-septal segment. Left  ventricular diastolic parameters were normal.   2. Right ventricular systolic function is normal. The right ventricular  size is mildly enlarged. There is mildly elevated pulmonary artery  systolic pressure. The estimated right ventricular systolic pressure is  37.1 mmHg.   3. The mitral valve is normal in structure. Mild mitral valve  regurgitation. No evidence of mitral stenosis.   4. The aortic valve is tricuspid. There is mild calcification of the  aortic valve. There is mild thickening of the aortic valve. Aortic valve  regurgitation is mild. Aortic valve sclerosis/calcification is present,  without any evidence of aortic  stenosis. Aortic valve area, by VTI measures 2.22 cm. Aortic valve mean  gradient measures 10.0 mmHg. Aortic valve Vmax measures 2.22 m/s.   5. The inferior vena cava is normal in size with greater than 50%  respiratory variability, suggesting right atrial pressure of 3 mmHg.   6. Agitated saline contrast bubble study was negative, with no evidence  of any interatrial shunt.   Carotid duplex 04/19/2023 Right Carotid: Velocities in the right ICA are consistent with a 1-39%  stenosis.                Non-hemodynamically significant plaque <50% noted in the  CCA.   Left Carotid: Velocities in the left ICA are  consistent with a 40-59%  stenosis.               Non-hemodynamically significant plaque <50% noted in the  CCA.   Vertebrals: Bilateral vertebral arteries demonstrate antegrade flow.  Subclavians: Normal flow hemodynamics were seen in bilateral subclavian               arteries.   ZIO 03/21/2022 Normal sinus rhythm. Occasional runs of SVT total 43 runs, longest lasting 13 beats. Frequent PACs 6.5% burden Frequent isolated PVCs burden 5.8%. No sustained tachy or bradyarrhythmias, AV block or pauses No Afib     Patch Wear Time:  7 days and 2 hours (2023-06-19T11:18:40-0400 to 2023-06-26T14:06:28-0400)   Patient had a min HR of 42 bpm, max HR of 176 bpm, and avg HR of 74 bpm. Predominant underlying rhythm was Sinus Rhythm. Bundle Branch Block/IVCD was present. 43 Supraventricular Tachycardia runs occurred, the run with the fastest interval lasting 6  beats with a max rate of 176  bpm, the longest lasting 13 beats with an avg rate of 126 bpm. Some episodes of Supraventricular Tachycardia may be possible Atrial Tachycardia with variable block. Isolated SVEs were frequent (6.5%, [id number]), SVE Couplets were  rare (<1.0%, 3224), and SVE Triplets were rare (<1.0%, 380). Isolated VEs were frequent (5.8%, 43313), VE Couplets were rare (<1.0%, 2323), and no VE Triplets were present. Ventricular Bigeminy and Trigeminy were present.  Cardiac PET/CT 03/08/2022   Findings are consistent with prior myocardial infarction with peri-infarct ischemia. The study is moderate-high risk in the presence reduced LVEF and infarction size.   LV perfusion is abnormal. There is a perfusion defect in the mid & basal inferolateral and basal inferior that worsens with stress and is associated with hypokinesis. This is a moderate (3-6 segments) size and moderate (10-20% of LV myocardium) severity lesion. Consistent with infarction with small peri-infarct ischemia.   There is no evidence of transient ischemic dilation, TID 1.02    Rest left ventricular function is low normal (normal for modality). Rest EF: 44 %. Stress left ventricular function is normal. Stress EF: 51 %. End diastolic cavity size is normal. End systolic cavity size is normal.   LVEF Reserve is normal at 7%   Myocardial blood flow reserve is not reported in this patient due to technical or patient-specific concerns that affect accuracy (Prior CABG)   Coronary calcium was present on the attenuation correction CT images. Severe coronary calcifications were present. Coronary calcifications were present in the left anterior descending artery, left circumflex artery and right coronary artery distribution(s). Aortic atherosclerosis noted.  Aortic valve calcification noted.   Electronically signed by Stanly Leavens MD Risk Assessment/Calculations:             Physical Exam:   VS:  BP 120/69   Pulse (!) 53   Ht 5' 9 (1.753 m)   Wt 135 lb (61.2 kg)   SpO2 95%   BMI 19.94 kg/m    Wt Readings from Last 3 Encounters:  06/11/24 135 lb (61.2 kg)  05/30/24 272 lb (123.4 kg)  04/24/24 132 lb 12.8 oz (60.2 kg)    GEN: Well nourished, well developed in no acute distress NECK: No JVD; No carotid bruits CARDIAC: RRR, no murmurs, rubs, gallops RESPIRATORY:  Clear to auscultation without rales, wheezing or rhonchi  ABDOMEN: Soft, non-tender, non-distended EXTREMITIES:  No edema; No acute deformity      Assessment and Plan:  Coronary artery disease Prior CABG in 1995 Cardiac PET 03/2022 consistent with prior MI and small peri-infarct ischemia with EF 44%.  Was discussed with Dr. Jordan who recommended optimization of medical therapy Echocardiogram 05/2024 showed LVEF 60 to 65%, no RWMA - Today he is without anginal symptoms.  He is recently very immobile due to fatigue post CVA.  He has had decreased exercise tolerance.  Reports stable chronic dyspnea (h/o IPF).  Denies any chest pains or exertional symptoms.  Recent echocardiogram showing no wall motion  abnormalities and normal EF is reassuring.  No indication for further ischemic evaluation at this time. 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  Rare PVCs, 4 beat run of NSVT, occasional PACs.  No atrial fibrillation -  Metoprolol was subsequently discontinued on 05/27/2024 given above results - Today he denies any dizziness, lightheadedness, syncope, or presyncope.  He does note diffuse fatigue and shortness of breath since CVA on 04/2024 - Discussed case with EP.  Who recommended repeat 7-day ZIO now off of beta-blocking therapy and referral to EP for further evaluation - Plan for 7-day ZIO - Referral to EP   CVA History of left MCA ischemic stroke on 04/2024 Cardiac event monitor 06/2024 showed no atrial fibrillation and echocardiogram bubble study 05/2024 showed agitated saline contrast bubble study was negative with no evidence of any interatrial shunt - He denies any new neurological symptoms.  Notes diffuse fatigue since CVA - Management per neurology  Hyperlipidemia No recent LDL on file, will attempt to obtain from PCP - Will likely need updated lipid panel at follow-up visit - Continue atorvastatin 20 mg daily and ezetimibe 10 mg daily  Hypertension Blood pressure today well-controlled at 120/69 - Continue isosorbide  15 mg daily  HFpEF Echocardiogram 08/2020 with LVEF 45 to 50% Cardiac PET 03/2022 with EF 44% Most recent echocardiogram/bubble study 05/2024 showed LVEF 60 to 65%, no RWMA, mild LVH, normal diastolic parameters - Most recent echocardiogram shows that he has had some improvement in his LVEF.  CT chest 05/2024 without pulmonary edema -  Today he appears euvolemic and well compensated on exam.  Not currently requiring loop diuretic therapy.  No increase in chronic SOB.  No orthopnea, PND, leg swelling, weight gain.  Volume status appears stable - Had an elevated proBNP of 725 ordered by pulmonology on 8/28 due to dyspnea on exertion - Metoprolol was recently discontinued as noted above.  Further GDMT limited by relative hypotension - Plan for BMET and BNP today  Idiopathic pulmonary fibrosis Reports stable chronic dyspnea - Not currently on medication for IPF - Management per pulmonology      Dispo:  Return in about 3 months (around 09/10/2024) for Jordan.  Signed, Lum LITTIE Louis, NP  "

## 2024-06-13 ENCOUNTER — Encounter: Payer: Self-pay | Admitting: Emergency Medicine

## 2024-06-13 ENCOUNTER — Ambulatory Visit: Payer: Self-pay | Admitting: Emergency Medicine

## 2024-06-13 LAB — BRAIN NATRIURETIC PEPTIDE: BNP: 146.4 pg/mL — AB (ref 0.0–100.0)

## 2024-06-13 LAB — CBC
Hematocrit: 32.9 % — ABNORMAL LOW (ref 37.5–51.0)
Hemoglobin: 11 g/dL — ABNORMAL LOW (ref 13.0–17.7)
MCH: 31.3 pg (ref 26.6–33.0)
MCHC: 33.4 g/dL (ref 31.5–35.7)
MCV: 94 fL (ref 79–97)
Platelets: 321 x10E3/uL (ref 150–450)
RBC: 3.52 x10E6/uL — ABNORMAL LOW (ref 4.14–5.80)
RDW: 13.1 % (ref 11.6–15.4)
WBC: 7.5 x10E3/uL (ref 3.4–10.8)

## 2024-06-13 LAB — BASIC METABOLIC PANEL WITH GFR
BUN/Creatinine Ratio: 22 (ref 10–24)
BUN: 24 mg/dL (ref 8–27)
CO2: 24 mmol/L (ref 20–29)
Calcium: 9 mg/dL (ref 8.6–10.2)
Chloride: 96 mmol/L (ref 96–106)
Creatinine, Ser: 1.08 mg/dL (ref 0.76–1.27)
Glucose: 99 mg/dL (ref 70–99)
Potassium: 4.7 mmol/L (ref 3.5–5.2)
Sodium: 134 mmol/L (ref 134–144)
eGFR: 67 mL/min/1.73 (ref >59)

## 2024-06-14 ENCOUNTER — Encounter: Payer: Self-pay | Admitting: Diagnostic Neuroimaging

## 2024-06-14 ENCOUNTER — Ambulatory Visit: Admitting: Diagnostic Neuroimaging

## 2024-06-14 ENCOUNTER — Ambulatory Visit: Attending: Emergency Medicine

## 2024-06-14 VITALS — BP 131/60 | HR 45 | Ht 69.0 in | Wt 136.8 lb

## 2024-06-14 DIAGNOSIS — M5416 Radiculopathy, lumbar region: Secondary | ICD-10-CM | POA: Diagnosis not present

## 2024-06-14 DIAGNOSIS — R001 Bradycardia, unspecified: Secondary | ICD-10-CM

## 2024-06-14 DIAGNOSIS — M48062 Spinal stenosis, lumbar region with neurogenic claudication: Secondary | ICD-10-CM | POA: Diagnosis not present

## 2024-06-14 NOTE — Progress Notes (Signed)
 [facility]  PATIENT: Mitchell Fields DOB: [birth year]  REFERRING CLINICIAN: Lujean Sieving, MD HISTORY FROM: patient  REASON FOR VISIT: new consult   HISTORICAL  CHIEF COMPLAINT:  Chief Complaint  Patient presents with   New Patient (Initial Visit)    Pt in room 6.  Wife in room. Paper referral lumbar radiculopathy.    HISTORY OF PRESENT ILLNESS:   UPDATE (, VRP): Since last visit, here for evaluation of right foot / ankle weakness since 2023. History of chronic low back pain for many years with 2 prior lumbar surgeries. Found to have severe spinal stenosis at L1-2, L2-3 and L4-5 on MRI in Dec 2023. Symptoms continued to worsen, and repeat MRI in Nov 2024 showed worsening at L1-2. Had surgery in Feb 2025, which halted progression of symptoms, but not much improvement. Referred here to see if EMG/NCS could be helpful in further diagnosis and treatment options.   PRIOR HPI (04/24/24, VRP): 88 year old male here for evaluation of stroke.  History of coronary artery disease status post CABG 1995, EF 45 to 50%, hypertension, hyperlipidemia, interstitial lung disease, left carotid stenosis.  04/07/2024 patient was in the shower getting ready for church when all of a sudden he felt confused and not well.  He got out of the shower and told his wife that he did not feel well.  His wife is a retired Engineer, civil (consulting).  She checked patient for signs of numbness, weakness, language difficulties but did not see any of the signs.  He decided to lay down to go to sleep instead.  Patient followed up with PCP on 04/09/2024 for evaluation of lightheadedness and fatigue.  Patient had lab testing and CT scan of the head ordered.  Abnormalities on CT of the head raise possibility of acute to subacute stroke.  This was followed up with MRI of the brain which confirmed left frontal and parietal watershed ischemic infarctions, acute to subacute in timing.  Referred here for further evaluation.  Patient  previously on aspirin 81 mg/day.  PCP changed to Plavix 75 mg/day.  Has history of lumbar spine degenerative disease, status post multiple surgeries with residual right foot drop weakness.    REVIEW OF SYSTEMS: Full 14 system review of systems performed and negative with exception of: As per HPI.  ALLERGIES: Allergies  Allergen Reactions   Cortisone Other (See Comments)    REACTION: shut adrenal glands down if given too much    HOME MEDICATIONS: Outpatient Medications Prior to Visit  Medication Sig Dispense Refill   atorvastatin (LIPITOR) 20 MG tablet Take 20 mg by mouth daily.     Cholecalciferol (VITAMIN D PO) Take 2,000 Units by mouth daily.     clopidogrel (PLAVIX) 75 MG tablet Take 75 mg by mouth daily.     ezetimibe (ZETIA) 10 MG tablet Take 1 tablet by mouth daily.     gabapentin (NEURONTIN) 300 MG capsule Take 300 mg by mouth daily as needed.     HYDROcodone -acetaminophen  (NORCO) 7.5-325 MG tablet Take 1 tablet by mouth 2 (two) times daily as needed for moderate pain. 20 tablet 0   isosorbide  mononitrate (IMDUR ) 30 MG 24 hr tablet Take 0.5 tablets (15 mg total) by mouth daily. 90 tablet 3   Misc Natural Products (GLUCOSAMINE CHOND COMPLEX/MSM) TABS take 1 po qd     Multiple Vitamins-Minerals (ZINC PO) Take by mouth daily.     nitroGLYCERIN  (NITROSTAT ) 0.4 MG SL tablet Place 1 tablet (0.4 mg total) under the tongue every 5 (  five) minutes as needed for chest pain. 90 tablet 3   pregabalin (LYRICA) 75 MG capsule as needed.     SYNTHROID 75 MCG tablet Take 75 mcg by mouth daily before breakfast.      Vitamin E 180 MG (400 UNIT) CAPS 1 capsule. (Patient not taking: Reported on 06/14/2024)     No facility-administered medications prior to visit.    PAST MEDICAL HISTORY: Past Medical History:  Diagnosis Date   Anemia    Arthritis    Back pain    Bursitis of left hip    Coronary artery disease    TWO VESSEL BYPASS SURGERY in January 1995   Diverticulosis    Hyperlipidemia     Hypertension    Monoclonal gammopathy    Neuropathy     PAST SURGICAL HISTORY: Past Surgical History:  Procedure Laterality Date   ADENOIDECTOMY     BACK SURGERY  1985, 1995, 2002   X3   BACK SURGERY  11/2023   COLONOSCOPY     CORONARY ARTERY BYPASS GRAFT     X2. LIMA GRAFT AND A RIMA GRAFT.   INGUINAL HERNIA REPAIR Left    Negative stress echo  08/11/2009   US  ECHOCARDIOGRAPHY  03/2011   Normal EF, mild aortic regurgitation, mild MR, LAE, mildly dilated RV, RAE, increased left atrial pressure, moderate to severe TR and mildly increased  PA systolic pressure.     FAMILY HISTORY: Family History  Problem Relation Age of Onset   Lung cancer Father 63       smoker   Diabetes Father    Cancer Sister        breast in one; bone cancer in other    SOCIAL HISTORY: Social History   Socioeconomic History   Marital status: Married    Spouse name: Not on file   Number of children: 2   Years of education: Not on file   Highest education level: Not on file  Occupational History   Occupation: retired    Associate Professor: RETIRED    Comment: Forensic scientist.  [employer]  Tobacco Use   Smoking status: Former    Current packs/day: 0.00    Average packs/day: 2.0 packs/day for 5.0 years (10.0 ttl pk-yrs)    Types: Cigarettes    Start date: 10/03/1962    Quit date: 10/04/1967    Years since quitting: 56.7   Smokeless tobacco: Never  Vaping Use   Vaping status: Never Used  Substance and Sexual Activity   Alcohol use: Yes    Alcohol/week: 1.0 standard drink of alcohol    Types: 1 Shots of liquor per week    Comment: only on occasion ( 1 drink on friday)   Drug use: No   Sexual activity: Not Currently  Other Topics Concern   Not on file  Social History Narrative   Lives w/wife   Social Drivers of Health   Financial Resource Strain: Not on file  Food Insecurity: Not on file  Transportation Needs: Not on file  Physical Activity: Not on file  Stress: Not on file  Social  Connections: Not on file  Intimate Partner Violence: Not on file     PHYSICAL EXAM  GENERAL EXAM/CONSTITUTIONAL: Vitals:  Vitals:   06/14/24 1128  BP: 131/60  Pulse: (!) 45  Weight: 136 lb 12.8 oz (62.1 kg)  Height: 5' 9 (1.753 m)   Body mass index is 20.2 kg/m. Wt Readings from Last 3 Encounters:  06/14/24 136 lb 12.8 oz (62.1 kg)  06/11/24 135 lb (61.2 kg)  05/30/24 272 lb (123.4 kg)   Patient is in no distress; well developed, nourished and groomed; neck is supple  CARDIOVASCULAR: Examination of carotid arteries is normal; no carotid bruits Regular rate and rhythm, no murmurs Examination of peripheral vascular system by observation and palpation is normal  EYES: Ophthalmoscopic exam of optic discs and posterior segments is normal; no papilledema or hemorrhages No results found.  MUSCULOSKELETAL: Gait, strength, tone, movements noted in Neurologic exam below  NEUROLOGIC: MENTAL STATUS:      No data to display         awake, alert, oriented to person, place and time recent and remote memory intact normal attention and concentration language fluent, comprehension intact, naming intact fund of knowledge appropriate  CRANIAL NERVE:  2nd - no papilledema on fundoscopic exam 2nd, 3rd, 4th, 6th - pupils equal and reactive to light, visual fields full to confrontation, extraocular muscles intact, no nystagmus 5th - facial sensation symmetric 7th - facial strength symmetric 8th - hearing intact 9th - palate elevates symmetrically, uvula midline 11th - shoulder shrug symmetric 12th - tongue protrusion midline  MOTOR:  normal bulk and tone, full strength in the BUE, BLE; EXCEPT RIGHT FOOT DF 1-2, INVERSION 2, EVER 2, PLANTAR FLEX 3  SENSORY:  normal and symmetric to light touch, temperature, vibration  COORDINATION:  finger-nose-finger, fine finger movements normal  REFLEXES:  deep tendon reflexes TRACE and symmetric  GAIT/STATION:  narrow based  gait     DIAGNOSTIC DATA (LABS, IMAGING, TESTING) - I reviewed patient records, labs, notes, testing and imaging myself where available.  Lab Results  Component Value Date   WBC 7.5 06/11/2024   HGB 11.0 (L) 06/11/2024   HCT 32.9 (L) 06/11/2024   MCV 94 06/11/2024   PLT 321 06/11/2024      Component Value Date/Time   NA 134 06/11/2024 1530   NA 135 (L) 03/23/2017 1316   K 4.7 06/11/2024 1530   K 4.5 03/23/2017 1316   CL 96 06/11/2024 1530   CL 94 (L) 03/20/2013 1510   CO2 24 06/11/2024 1530   CO2 26 03/23/2017 1316   GLUCOSE 99 06/11/2024 1530   GLUCOSE 79 05/30/2024 1057   GLUCOSE 103 03/23/2017 1316   GLUCOSE 122 (H) 03/20/2013 1510   BUN 24 06/11/2024 1530   BUN 17.8 03/23/2017 1316   CREATININE 1.08 06/11/2024 1530   CREATININE 1.11 10/21/2022 1305   CREATININE 1.3 03/23/2017 1316   CALCIUM 9.0 06/11/2024 1530   CALCIUM 9.6 03/23/2017 1316   PROT 7.0 05/30/2024 1057   PROT 6.5 03/23/2017 1317   PROT 6.8 03/23/2017 1316   ALBUMIN 4.0 05/30/2024 1057   ALBUMIN 3.8 03/23/2017 1316   AST 16 05/30/2024 1057   AST 21 10/21/2022 1305   AST 29 03/23/2017 1316   ALT 19 05/30/2024 1057   ALT 25 10/21/2022 1305   ALT 28 03/23/2017 1316   ALKPHOS 50 05/30/2024 1057   ALKPHOS 59 03/23/2017 1316   BILITOT 0.7 05/30/2024 1057   BILITOT 0.7 10/21/2022 1305   BILITOT 1.00 03/23/2017 1316   GFRNONAA >60 10/28/2023 1228   GFRNONAA >60 10/21/2022 1305   GFRAA >60 03/23/2020 1333   Lab Results  Component Value Date   CHOL 178 03/23/2020   HDL 54 03/23/2020   LDLCALC 96 03/23/2020   TRIG 142 03/23/2020   CHOLHDL 3.3 03/23/2020   No results found for: HGBA1C No results found for: [id number] Lab Results  Component Value  Date   TSH 3.814 03/21/2019    10/01/22 MRI lumbar spine 1. L1-2 large right paracentral extrusion with upward migration. Advanced spinal stenosis and right L1 impingement, the herniation is new from 2019. 2. Advanced chronic lumbar spine  degeneration with scoliosis at the subjacent levels. 3. L2-3 and L4-5 severe spinal stenosis. L3-4 moderate spinal stenosis. 4. Right foraminal impingement at L3-4, L4-5 and left foraminal impingement at L4-5, L5-S1.  08/24/23 MRI lumbar spine [I reviewed images myself and agree with interpretation. -VRP]  1. Mild interval enlargement of a chronic L1-2 disc extrusion with severe spinal and right lateral recess stenosis. 2. Unchanged severe spinal stenosis at L2-3 and L4-5. 3. Unchanged multilevel neural foraminal stenosis, most severe on the right at L3-4 and L4-5.   04/19/23 carotid u/s Right Carotid: Velocities in the right ICA are consistent with a 1-39%  stenosis. Non-hemodynamically significant plaque <50% noted in the CCA.   Left Carotid: Velocities in the left ICA are consistent with a 40-59%  stenosis. Non-hemodynamically significant plaque <50% noted in the CCA.   Vertebrals: Bilateral vertebral arteries demonstrate antegrade flow.  Subclavians: Normal flow hemodynamics were seen in bilateral subclavian               arteries.   04/15/24 MRI brain [I reviewed images myself and agree with interpretation. -VRP]  1. Scattered foci of diffusion signal abnormality involving the cortical to subcortical aspect of the left cerebral hemisphere, consistent with evolving subacute ischemic infarcts, largely left MCA and/or watershed in territory. Mild associated petechial blood products at the level of the operculum without hemorrhagic transformation or significant regional mass effect. 2. Underlying age-related cerebral atrophy with moderately advanced chronic microvascular ischemic disease.  04/30/24 CTA head / neck 1. Aortic atherosclerosis. 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 2. 30% stenosis at the origin of the right common carotid artery. 30% stenosis of the distal right common carotid artery proximal to the bifurcation. Dense  calcified plaque at the right carotid bifurcation and ICA bulb. 60% stenosis of the proximal ICA. 3. 40% stenosis of the distal left ICA bulb. This is due to soft and calcified plaque and could be a source of embolic disease, though not grossly flow limiting. 4. 30% stenosis of the proximal left vertebral artery. 5. No intracranial large vessel occlusion or proximal stenosis. 6. Focal calcification within an anterior insular MCA branch on the left consistent with embolized plaque.  05/30/24 TTE - No intracardiac source of embolism  detected on this transthoracic study. Consider a transesophageal  echocardiogram to exclude cardiac source of embolism if clinically  indicated.   06/05/24 cardiac monitor Normal sinus rhythm   Periods of 2:1 AV block and complete heart  block with bradycardia into the 30s. no prolonged pauses   Rare PVCs. 4 beat run NSVT   Occasional PACs.   No Afib seen.    ASSESSMENT AND PLAN  88 y.o. year old male here with:   Dx:  1. Spinal stenosis of lumbar region with neurogenic claudication   2. Right lumbar radiculopathy      PLAN:   LUMBAR SPINAL STENOSIS (since 2023; worsening in 2024; s/p decompression in Feb 2025; persistent residual right foot / ankle weakness: dorsiflexion, inversion, eversion, plantar flexion) - symptoms due to chronic lumbar spinal stenosis and lumbar radiculopathies - surgery seems to have stabilized the progression of decline; nerve root recovery is a slow process and may be incomplete to the long standing nature  of the spinal stenosis before decompression - EMG/NCS not likely to change treatment options (patient not interested in more surgeries); signs and symptoms are most consistent with chronic right L5-S1 radiculopathies - recommend to follow up with physical therapy exercises - no driving due to right foot / leg weakness  CONFUSION / MALAISE EVENT on 04/07/24 --> acute-subacute left brain ischemic infarctions (watershed  territory due to left ICA stenosis versus embolic phenomenon; intracranial and extracranial atherosclerosis) - continue plavix, atorvastatin, zetia  CARDIAC MONITOR RESULTS -  Periods of 2:1 AV block and complete heart  block with bradycardia into the 30s. no prolonged pauses - follow up with cardiology and EP consult  Orders Placed This Encounter  Procedures   Ambulatory referral to Physical Therapy   Return for return to PCP, pending if symptoms worsen or fail to improve.  I spent 40 minutes of face-to-face and non-face-to-face time with patient.  This included previsit chart review, lab review, study review, order entry, electronic health record documentation, patient education.     EDUARD FABIENE HANLON, MD 06/14/2024, 12:35 PM Certified in Neurology, Neurophysiology and Neuroimaging  [facility] [address] [place], [place] [address] [phone]

## 2024-06-14 NOTE — Patient Instructions (Addendum)
 LUMBAR SPINAL STENOSIS (since 2023; worsening in 2024; s/p decompression in Feb 2025; persistent residual right foot / ankle weakness: dorsiflexion, inversion, eversion, plantar flexion) - symptoms due to chronic lumbar spinal stenosis and lumbar radiculopathies - surgery seems to have stabilized the progression of decline; nerve root recovery is a slow process and may be incomplete to the long standing nature of the spinal stenosis before decompression - EMG/NCS not likely to change treatment options (patient not interested in more surgeries) - recommend to follow up with physical therapy exercises - no driving due to right foot / leg weakness  CONFUSION / MALAISE EVENT on 04/07/24 --> acute-subacute left brain ischemic infarctions (watershed territory due to left ICA stenosis versus embolic phenomenon; intracranial and extracranial atherosclerosis) - continue plavix, atorvastatin, zetia

## 2024-06-14 NOTE — Telephone Encounter (Signed)
 No atrial fibrillation noted. Continue to follow up with cardiology and EP clinic re: heart block and low heart rate. -VRP

## 2024-06-14 NOTE — Progress Notes (Unsigned)
Enrolled for [employer] to mail a ZIO XT long term holter monitor to the patients address on file.   Dr. Jordan to read. 

## 2024-06-14 NOTE — Addendum Note (Signed)
 Addended by: BILLY CAMELIA CROME on: 06/14/2024 08:09 AM   Modules accepted: Orders

## 2024-06-17 ENCOUNTER — Telehealth: Payer: Self-pay | Admitting: Diagnostic Neuroimaging

## 2024-06-17 NOTE — Telephone Encounter (Signed)
 Referral for physical therapy sent through [facility] to [facility]

## 2024-06-24 ENCOUNTER — Ambulatory Visit: Attending: Diagnostic Neuroimaging | Admitting: Physical Therapy

## 2024-06-24 DIAGNOSIS — M48062 Spinal stenosis, lumbar region with neurogenic claudication: Secondary | ICD-10-CM | POA: Insufficient documentation

## 2024-06-24 DIAGNOSIS — R293 Abnormal posture: Secondary | ICD-10-CM | POA: Diagnosis present

## 2024-06-24 DIAGNOSIS — M62830 Muscle spasm of back: Secondary | ICD-10-CM | POA: Diagnosis present

## 2024-06-24 DIAGNOSIS — M5459 Other low back pain: Secondary | ICD-10-CM | POA: Diagnosis present

## 2024-06-24 DIAGNOSIS — M5416 Radiculopathy, lumbar region: Secondary | ICD-10-CM | POA: Diagnosis not present

## 2024-06-24 DIAGNOSIS — M6281 Muscle weakness (generalized): Secondary | ICD-10-CM | POA: Insufficient documentation

## 2024-06-24 NOTE — Therapy (Signed)
 OUTPATIENT PHYSICAL THERAPY THORACOLUMBAR EVALUATION   Patient Name: Mitchell Fields MRN: [id number] DOB:30-Mar-1936, 88 y.o., male Today's Date: 06/24/2024  END OF SESSION:  PT End of Session - 06/24/24 1418     Visit Number 1    Number of Visits 12    Date for Recertification  08/05/24    PT Start Time 0113    PT Stop Time 0152    PT Time Calculation (min) 39 min    Activity Tolerance Patient tolerated treatment well    Behavior During Therapy [facility] for tasks assessed/performed          Past Medical History:  Diagnosis Date   Anemia    Arthritis    Back pain    Bursitis of left hip    Coronary artery disease    TWO VESSEL BYPASS SURGERY in January 1995   Diverticulosis    Hyperlipidemia    Hypertension    Monoclonal gammopathy    Neuropathy    Past Surgical History:  Procedure Laterality Date   ADENOIDECTOMY     BACK SURGERY  1985, 1995, 2002   X3   BACK SURGERY  11/2023   COLONOSCOPY     CORONARY ARTERY BYPASS GRAFT     X2. LIMA GRAFT AND A RIMA GRAFT.   INGUINAL HERNIA REPAIR Left    Negative stress echo  08/11/2009   US  ECHOCARDIOGRAPHY  03/2011   Normal EF, mild aortic regurgitation, mild MR, LAE, mildly dilated RV, RAE, increased left atrial pressure, moderate to severe TR and mildly increased  PA systolic pressure.    Patient Active Problem List   Diagnosis Date Noted   Trochanteric bursitis, right hip 08/23/2021   CKD (chronic kidney disease), stage III (HCC) 04/01/2021   Positive anti-CCP test 11/24/2020   Hand deformity 11/24/2020   Pulmonary fibrosis (HCC) 08/24/2020   Diverticulosis of colon 08/24/2020   Melena 08/14/2020   Cough in adult 08/14/2020   Left rotator cuff tear arthropathy 09/16/2019   Other fatigue 03/06/2019   Alcoholic peripheral neuropathy 03/12/2018   Sciatica, right side 10/11/2017   Acute pain of right shoulder 03/02/2017   Impingement syndrome of right shoulder 03/02/2017   Anemia due to chronic illness 03/13/2014    Monoclonal gammopathy    Lung nodules 03/11/2013   Weight loss, non-intentional 01/28/2013   CAD (coronary artery disease) 08/03/2011   HTN (hypertension) 08/03/2011   Hyperlipidemia 08/03/2011   Valvular heart disease 08/03/2011   REFERRING PROVIDER: Eduard Hanlon  REFERRING DIAG: Spinal stenosis of lumbar region with neurogenic claudication.  Right lumbar radiculopath  Rationale for Evaluation and Treatment: Rehabilitation  THERAPY DIAG:  Other low back pain  Abnormal posture  Muscle spasm of back  ONSET DATE: Ongoing.  Gradual.    SUBJECTIVE:  SUBJECTIVE STATEMENT: The patient presents to the clinic with c/o chronic low back pain.  He finds electrical stimulation helpful.  He rates his pain at a 7-8/10 today.  Increased activity and walking increases his pain.  Sitting decreases his pain.    PERTINENT HISTORY:  Stroke, 4 prior lumbar surgeries.    PAIN:  Are you having pain? Yes: NPRS scale: 7-8/10.   Pain location: Bilateral LB/upper glut. Pain description: Ache and sore. Aggravating factors: As above. Relieving factors: As above.    PRECAUTIONS: Fall.  He is using his cane today and reports no falls with cane.    RED FLAGS: None   WEIGHT BEARING RESTRICTIONS: No  FALLS:  Has patient fallen in last 6 months? Yes. Number of falls He states he fell once after his lumbar surgery in February.  Reports no falls using cane which it is recommended he use at all times.    LIVING ENVIRONMENT: Lives with: lives with their spouse Lives in: House/apartment Stairs: 3 steps to enter. Has following equipment at home: Single point cane  OCCUPATION: Retired   PLOF: Independent with basic ADLs and Independent with household mobility with device  PATIENT GOALS: Decrease back pain.       OBJECTIVE:   PATIENT SURVEYS:  ODI:  25/50.    POSTURE: rounded shoulders, forward head, decreased lumbar lordosis, and increased thoracic kyphosis  PALPATION: CC is bilateral tenderness over bilateral upper gluteal musculature.  His bilateral lumbar musculature is very taut to palpation.    LUMBAR ROM:   Decreased by 25% and extension to neutral.      LOWER EXTREMITY MMT:    Right dorsiflexion dorsiflexion 1 to 1+/5, knee strength is 4+/5.    GAIT: Steppage gait due to right foot drop using a straight cane.    TREATMENT DATE: 06/24/24;  HMP and IFC at 80-150 Hz on 40% scan x 17 minutes.      Normal modality response following removal of modality                                                                                                                             PATIENT EDUCATION:  Education details:  Person educated:  International aid/development worker:  Education comprehension:   HOME EXERCISE PROGRAM:   ASSESSMENT:  CLINICAL IMPRESSION: The patient presents to OPPT with c/o bilateral low back pain.  He is palpably tender over both upper gluteal musculature regions.  He has foot drop on the right but goes without his AFO as it is uncomfortable.  He has a steppage gait.  Recommend that he use his cane at all times.  His ODI score is 25/50.  He has multiple postural abnormalities.   Patient will benefit from skilled PT intervention to address pain and deficits.  OBJECTIVE IMPAIRMENTS: Abnormal gait, decreased activity tolerance, decreased ROM, decreased strength, increased muscle spasms, postural dysfunction, and pain.   ACTIVITY LIMITATIONS: carrying, lifting, bending, standing, and locomotion level  PARTICIPATION  LIMITATIONS: meal prep, cleaning, laundry, community activity, and yard work  PERSONAL FACTORS: Time since onset of injury/illness/exacerbation and 1-2 comorbidities: prior lumbar surgeries, stroke are also affecting patient's functional outcome.   REHAB  POTENTIAL: Good  CLINICAL DECISION MAKING: Stable/uncomplicated  EVALUATION COMPLEXITY: Low   GOALS:   LONG TERM GOALS: Target date: 08/05/24  Ind with a HEP.  Goal status: INITIAL  2.  Walk a community distance with pain not > 3/10.  Goal status: INITIAL  3.  Improve ODI score by at least 6 points.  Goal status: INITIAL  4.  Perform ADL's with pain not > 3/10.  Goal status: INITIAL   PLAN:  PT FREQUENCY: 2x/week  PT DURATION: 6 weeks  PLANNED INTERVENTIONS: 97110-Therapeutic exercises, 97530- Therapeutic activity, [id number]- Neuromuscular re-education, 97535- Self Care, [id number]- Manual therapy, [id number]- Electrical stimulation (unattended), 97035- Ultrasound, 79439 (1-2 muscles), 20561 (3+ muscles)- Dry Needling, Patient/Family education, and Moist heat.  PLAN FOR NEXT SESSION: Combo e'stim/US , STW/M, core exercise progression.     Betti Goodenow, [place], PT 06/24/2024, 3:23 PM

## 2024-06-26 ENCOUNTER — Ambulatory Visit: Admitting: Physical Therapy

## 2024-06-26 ENCOUNTER — Encounter: Payer: Self-pay | Admitting: Physical Therapy

## 2024-06-26 DIAGNOSIS — M62830 Muscle spasm of back: Secondary | ICD-10-CM

## 2024-06-26 DIAGNOSIS — R293 Abnormal posture: Secondary | ICD-10-CM

## 2024-06-26 DIAGNOSIS — M6281 Muscle weakness (generalized): Secondary | ICD-10-CM

## 2024-06-26 DIAGNOSIS — M5459 Other low back pain: Secondary | ICD-10-CM | POA: Diagnosis not present

## 2024-06-26 NOTE — Therapy (Signed)
 OUTPATIENT PHYSICAL THERAPY TREATMENT   Patient Name: Mitchell Fields MRN: [id number] DOB:[birth year], 88 y.o., male Today's Date: 06/26/2024  END OF SESSION:  PT End of Session - 06/26/24 1437     Visit Number 2    Number of Visits 12    Date for Recertification  08/05/24    PT Start Time 1430    PT Stop Time 1510    PT Time Calculation (min) 40 min    Activity Tolerance Patient tolerated treatment well    Behavior During Therapy [facility] for tasks assessed/performed           Past Medical History:  Diagnosis Date   Anemia    Arthritis    Back pain    Bursitis of left hip    Coronary artery disease    TWO VESSEL BYPASS SURGERY in January 1995   Diverticulosis    Hyperlipidemia    Hypertension    Monoclonal gammopathy    Neuropathy    Past Surgical History:  Procedure Laterality Date   ADENOIDECTOMY     BACK SURGERY  1985, 1995, 2002   X3   BACK SURGERY  11/2023   COLONOSCOPY     CORONARY ARTERY BYPASS GRAFT     X2. LIMA GRAFT AND A RIMA GRAFT.   INGUINAL HERNIA REPAIR Left    Negative stress echo  08/11/2009   US  ECHOCARDIOGRAPHY  03/2011   Normal EF, mild aortic regurgitation, mild MR, LAE, mildly dilated RV, RAE, increased left atrial pressure, moderate to severe TR and mildly increased  PA systolic pressure.    Patient Active Problem List   Diagnosis Date Noted   Trochanteric bursitis, right hip 08/23/2021   CKD (chronic kidney disease), stage III (HCC) 04/01/2021   Positive anti-CCP test 11/24/2020   Hand deformity 11/24/2020   Pulmonary fibrosis (HCC) 08/24/2020   Diverticulosis of colon 08/24/2020   Melena 08/14/2020   Cough in adult 08/14/2020   Left rotator cuff tear arthropathy 09/16/2019   Other fatigue 03/06/2019   Alcoholic peripheral neuropathy 03/12/2018   Sciatica, right side 10/11/2017   Acute pain of right shoulder 03/02/2017   Impingement syndrome of right shoulder 03/02/2017   Anemia due to chronic illness 03/13/2014   Monoclonal  gammopathy    Lung nodules 03/11/2013   Weight loss, non-intentional 01/28/2013   CAD (coronary artery disease) 08/03/2011   HTN (hypertension) 08/03/2011   Hyperlipidemia 08/03/2011   Valvular heart disease 08/03/2011   REFERRING PROVIDER: Eduard Hanlon  REFERRING DIAG: Spinal stenosis of lumbar region with neurogenic claudication.  Right lumbar radiculopath  Rationale for Evaluation and Treatment: Rehabilitation  THERAPY DIAG:  Other low back pain  Abnormal posture  Muscle spasm of back  Muscle weakness (generalized)  ONSET DATE: Ongoing.  Gradual.    SUBJECTIVE:  SUBJECTIVE STATEMENT: Pt states report he gets fatigued easily.   PERTINENT HISTORY:  Stroke, 4 prior lumbar surgeries.    PAIN:  Are you having pain? Yes: NPRS scale: 7-8/10.   Pain location: Bilateral LB/upper glut. Pain description: Ache and sore. Aggravating factors: As above. Relieving factors: As above.    PRECAUTIONS: Fall.  He is using his cane today and reports no falls with cane.    RED FLAGS: None   WEIGHT BEARING RESTRICTIONS: No  FALLS:  Has patient fallen in last 6 months? Yes. Number of falls He states he fell once after his lumbar surgery in February.  Reports no falls using cane which it is recommended he use at all times.    LIVING ENVIRONMENT: Lives with: lives with their spouse Lives in: House/apartment Stairs: 3 steps to enter. Has following equipment at home: Single point cane  OCCUPATION: Retired   PLOF: Independent with basic ADLs and Independent with household mobility with device  PATIENT GOALS: Decrease back pain.      OBJECTIVE:   PATIENT SURVEYS:  ODI:  25/50.    POSTURE: rounded shoulders, forward head, decreased lumbar lordosis, and increased thoracic  kyphosis  PALPATION: CC is bilateral tenderness over bilateral upper gluteal musculature.  His bilateral lumbar musculature is very taut to palpation.    LUMBAR ROM:  Decreased by 25% and extension to neutral.    LOWER EXTREMITY MMT:   Right dorsiflexion dorsiflexion 1 to 1+/5, knee strength is 4+/5.    GAIT: Steppage gait due to right foot drop using a straight cane.    TREATMENT DATE:  06/26/24 Nustep L1 LEs x 10 min Sitting thoracolumbar ext x10 Sitting ant/post pelvic tilt 2x10 Sitting pball press down x10 Sitting pball press down for oblique iso x10 Sitting figure 4 stretch x30 Sitting piriformis stretch x30 E-stim premod intensity to pt tolerance x 15 min with moist heat  06/24/24;  HMP and IFC at 80-150 Hz on 40% scan x 17 minutes.      Normal modality response following removal of modality                                                                                                                             PATIENT EDUCATION:  Education details:  Person educated:  International aid/development worker:  Education comprehension:   HOME EXERCISE PROGRAM: Access Code: [email or web address] URL: [email or web address] Date: 06/26/2024 Prepared by: Elbia Paro April Earnie Starring  Exercises - Seated Thoracic Lumbar Extension with Pectoralis Stretch  - 1 x daily - 7 x weekly - 1 sets - 10 reps - Seated Pelvic Tilt  - 1 x daily - 7 x weekly - 1 sets - 10 reps - Seated Figure 4 Piriformis Stretch  - 3 x daily - 7 x weekly - 2 sets - 30 sec hold - Seated Piriformis Stretch  - 3 x daily - 7 x weekly - 2 sets - 30 sec hold  ASSESSMENT:  CLINICAL IMPRESSION: Treatment  focused on improving pt's strength and endurance. Initiated core strengthening today and working on lumbar/pelvic/hip mobility/stretching. Pt tolerated session well.   OBJECTIVE IMPAIRMENTS: Abnormal gait, decreased activity tolerance, decreased ROM, decreased strength, increased muscle spasms, postural dysfunction, and pain.    ACTIVITY LIMITATIONS: carrying, lifting, bending, standing, and locomotion level  PARTICIPATION LIMITATIONS: meal prep, cleaning, laundry, community activity, and yard work  PERSONAL FACTORS: Time since onset of injury/illness/exacerbation and 1-2 comorbidities: prior lumbar surgeries, stroke are also affecting patient's functional outcome.   REHAB POTENTIAL: Good  CLINICAL DECISION MAKING: Stable/uncomplicated  EVALUATION COMPLEXITY: Low   GOALS:   LONG TERM GOALS: Target date: 08/05/24  Ind with a HEP.  Goal status: INITIAL  2.  Walk a community distance with pain not > 3/10.  Goal status: INITIAL  3.  Improve ODI score by at least 6 points.  Goal status: INITIAL  4.  Perform ADL's with pain not > 3/10.  Goal status: INITIAL   PLAN:  PT FREQUENCY: 2x/week  PT DURATION: 6 weeks  PLANNED INTERVENTIONS: 97110-Therapeutic exercises, 97530- Therapeutic activity, [id number]- Neuromuscular re-education, 97535- Self Care, 02859- Manual therapy, G0283- Electrical stimulation (unattended), 97035- Ultrasound, 79439 (1-2 muscles), 20561 (3+ muscles)- Dry Needling, Patient/Family education, and Moist heat.  PLAN FOR NEXT SESSION: Combo e'stim/US , STW/M, core exercise progression.     Kilie Rund April Ma L Jarome Trull, PT 06/26/2024, 2:38 PM

## 2024-07-01 ENCOUNTER — Encounter: Payer: Self-pay | Admitting: Physical Therapy

## 2024-07-01 ENCOUNTER — Ambulatory Visit: Admitting: Physical Therapy

## 2024-07-01 DIAGNOSIS — M5459 Other low back pain: Secondary | ICD-10-CM

## 2024-07-01 DIAGNOSIS — R293 Abnormal posture: Secondary | ICD-10-CM

## 2024-07-01 NOTE — Therapy (Signed)
 OUTPATIENT PHYSICAL THERAPY TREATMENT   Patient Name: Mitchell Fields MRN: [id number] DOB:07/06/1936, 88 y.o., male Today's Date: 07/01/2024  END OF SESSION:  PT End of Session - 07/01/24 1333     Visit Number 3    Number of Visits 12    Date for Recertification  08/05/24    PT Start Time 1255    PT Stop Time 1352    PT Time Calculation (min) 57 min    Activity Tolerance Patient tolerated treatment well    Behavior During Therapy [facility] for tasks assessed/performed            Past Medical History:  Diagnosis Date   Anemia    Arthritis    Back pain    Bursitis of left hip    Coronary artery disease    TWO VESSEL BYPASS SURGERY in January 1995   Diverticulosis    Hyperlipidemia    Hypertension    Monoclonal gammopathy    Neuropathy    Past Surgical History:  Procedure Laterality Date   ADENOIDECTOMY     BACK SURGERY  1985, 1995, 2002   X3   BACK SURGERY  11/2023   COLONOSCOPY     CORONARY ARTERY BYPASS GRAFT     X2. LIMA GRAFT AND A RIMA GRAFT.   INGUINAL HERNIA REPAIR Left    Negative stress echo  08/11/2009   US  ECHOCARDIOGRAPHY  03/2011   Normal EF, mild aortic regurgitation, mild MR, LAE, mildly dilated RV, RAE, increased left atrial pressure, moderate to severe TR and mildly increased  PA systolic pressure.    Patient Active Problem List   Diagnosis Date Noted   Trochanteric bursitis, right hip 08/23/2021   CKD (chronic kidney disease), stage III (HCC) 04/01/2021   Positive anti-CCP test 11/24/2020   Hand deformity 11/24/2020   Pulmonary fibrosis (HCC) 08/24/2020   Diverticulosis of colon 08/24/2020   Melena 08/14/2020   Cough in adult 08/14/2020   Left rotator cuff tear arthropathy 09/16/2019   Other fatigue 03/06/2019   Alcoholic peripheral neuropathy 03/12/2018   Sciatica, right side 10/11/2017   Acute pain of right shoulder 03/02/2017   Impingement syndrome of right shoulder 03/02/2017   Anemia due to chronic illness 03/13/2014   Monoclonal  gammopathy    Lung nodules 03/11/2013   Weight loss, non-intentional 01/28/2013   CAD (coronary artery disease) 08/03/2011   HTN (hypertension) 08/03/2011   Hyperlipidemia 08/03/2011   Valvular heart disease 08/03/2011   REFERRING PROVIDER: Eduard Hanlon  REFERRING DIAG: Spinal stenosis of lumbar region with neurogenic claudication.  Right lumbar radiculopath  Rationale for Evaluation and Treatment: Rehabilitation  THERAPY DIAG:  Other low back pain  Abnormal posture  ONSET DATE: Ongoing.  Gradual.    SUBJECTIVE:  SUBJECTIVE STATEMENT: No better.  Hurting more on left side today.  PERTINENT HISTORY:  Stroke, 4 prior lumbar surgeries.    PAIN:  Are you having pain? Yes: NPRS scale: 7-8/10.   Pain location: Bilateral LB/upper glut. Pain description: Ache and sore. Aggravating factors: As above. Relieving factors: As above.    PRECAUTIONS: Fall.  He is using his cane today and reports no falls with cane.    RED FLAGS: None   WEIGHT BEARING RESTRICTIONS: No  FALLS:  Has patient fallen in last 6 months? Yes. Number of falls He states he fell once after his lumbar surgery in February.  Reports no falls using cane which it is recommended he use at all times.    LIVING ENVIRONMENT: Lives with: lives with their spouse Lives in: House/apartment Stairs: 3 steps to enter. Has following equipment at home: Single point cane  OCCUPATION: Retired   PLOF: Independent with basic ADLs and Independent with household mobility with device  PATIENT GOALS: Decrease back pain.      OBJECTIVE:   PATIENT SURVEYS:  ODI:  25/50.    POSTURE: rounded shoulders, forward head, decreased lumbar lordosis, and increased thoracic kyphosis  PALPATION: CC is bilateral tenderness over bilateral upper  gluteal musculature.  His bilateral lumbar musculature is very taut to palpation.    LUMBAR ROM:  Decreased by 25% and extension to neutral.    LOWER EXTREMITY MMT:   Right dorsiflexion dorsiflexion 1 to 1+/5, knee strength is 4+/5.    GAIT: Steppage gait due to right foot drop using a straight cane.    TREATMENT DATE:   07/01/24:  Right sdly position with folded pillow between knees for comfort: Combo e'stim/US  at 1.50 W/CM2 x 12 minutes f/b STW/M x 11 minutes f/b HMP and IFC at 80-150 Hz on 40% scan x 20 minutes.    06/26/24 Nustep L1 LEs x 10 min Sitting thoracolumbar ext x10 Sitting ant/post pelvic tilt 2x10 Sitting pball press down x10 Sitting pball press down for oblique iso x10 Sitting figure 4 stretch x30 Sitting piriformis stretch x30 E-stim premod intensity to pt tolerance x 15 min with moist heat  06/24/24;  HMP and IFC at 80-150 Hz on 40% scan x 17 minutes.      Normal modality response following removal of modality                                                                                                                             PATIENT EDUCATION:  Education details:  Person educated:  International aid/development worker:  Education comprehension:   HOME EXERCISE PROGRAM: Access Code: [email or web address] URL: [email or web address] Date: 06/26/2024 Prepared by: Gellen April Earnie Starring  Exercises - Seated Thoracic Lumbar Extension with Pectoralis Stretch  - 1 x daily - 7 x weekly - 1 sets - 10 reps - Seated Pelvic Tilt  - 1 x daily - 7 x weekly - 1 sets - 10 reps - Seated Figure 4 Piriformis  Stretch  - 3 x daily - 7 x weekly - 2 sets - 30 sec hold - Seated Piriformis Stretch  - 3 x daily - 7 x weekly - 2 sets - 30 sec hold  ASSESSMENT:  CLINICAL IMPRESSION: Patient with notable tenderness over left gluteal and lumbar musculature.  He did very well with treatment today and felt better following.  OBJECTIVE IMPAIRMENTS: Abnormal gait, decreased activity  tolerance, decreased ROM, decreased strength, increased muscle spasms, postural dysfunction, and pain.   ACTIVITY LIMITATIONS: carrying, lifting, bending, standing, and locomotion level  PARTICIPATION LIMITATIONS: meal prep, cleaning, laundry, community activity, and yard work  PERSONAL FACTORS: Time since onset of injury/illness/exacerbation and 1-2 comorbidities: prior lumbar surgeries, stroke are also affecting patient's functional outcome.   REHAB POTENTIAL: Good  CLINICAL DECISION MAKING: Stable/uncomplicated  EVALUATION COMPLEXITY: Low   GOALS:   LONG TERM GOALS: Target date: 08/05/24  Ind with a HEP.  Goal status: INITIAL  2.  Walk a community distance with pain not > 3/10.  Goal status: INITIAL  3.  Improve ODI score by at least 6 points.  Goal status: INITIAL  4.  Perform ADL's with pain not > 3/10.  Goal status: INITIAL   PLAN:  PT FREQUENCY: 2x/week  PT DURATION: 6 weeks  PLANNED INTERVENTIONS: 97110-Therapeutic exercises, 97530- Therapeutic activity, [id number]- Neuromuscular re-education, 97535- Self Care, 02859- Manual therapy, G0283- Electrical stimulation (unattended), 97035- Ultrasound, 79439 (1-2 muscles), 20561 (3+ muscles)- Dry Needling, Patient/Family education, and Moist heat.  PLAN FOR NEXT SESSION: Combo e'stim/US , STW/M, core exercise progression.     Ashlin Kreps, [place], PT 07/01/2024, 2:15 PM

## 2024-07-03 ENCOUNTER — Encounter: Payer: Self-pay | Admitting: Physical Therapy

## 2024-07-03 ENCOUNTER — Telehealth: Payer: Self-pay | Admitting: Student

## 2024-07-03 ENCOUNTER — Ambulatory Visit: Attending: Diagnostic Neuroimaging | Admitting: Physical Therapy

## 2024-07-03 DIAGNOSIS — M62830 Muscle spasm of back: Secondary | ICD-10-CM | POA: Diagnosis present

## 2024-07-03 DIAGNOSIS — R2689 Other abnormalities of gait and mobility: Secondary | ICD-10-CM | POA: Insufficient documentation

## 2024-07-03 DIAGNOSIS — R2681 Unsteadiness on feet: Secondary | ICD-10-CM | POA: Insufficient documentation

## 2024-07-03 DIAGNOSIS — M5459 Other low back pain: Secondary | ICD-10-CM | POA: Diagnosis present

## 2024-07-03 DIAGNOSIS — M6281 Muscle weakness (generalized): Secondary | ICD-10-CM | POA: Insufficient documentation

## 2024-07-03 DIAGNOSIS — R293 Abnormal posture: Secondary | ICD-10-CM | POA: Insufficient documentation

## 2024-07-03 NOTE — Therapy (Signed)
 OUTPATIENT PHYSICAL THERAPY TREATMENT   Patient Name: Mitchell Fields MRN: [id number] DOB:14-Apr-1936, 88 y.o., male Today's Date: 07/03/2024  END OF SESSION:  PT End of Session - 07/03/24 1305     Visit Number 4    Number of Visits 12    Date for Recertification  08/05/24    PT Start Time 0100    Activity Tolerance Patient tolerated treatment well    Behavior During Therapy [facility] for tasks assessed/performed             Past Medical History:  Diagnosis Date   Anemia    Arthritis    Back pain    Bursitis of left hip    Coronary artery disease    TWO VESSEL BYPASS SURGERY in January 1995   Diverticulosis    Hyperlipidemia    Hypertension    Monoclonal gammopathy    Neuropathy    Past Surgical History:  Procedure Laterality Date   ADENOIDECTOMY     BACK SURGERY  1985, 1995, 2002   X3   BACK SURGERY  11/2023   COLONOSCOPY     CORONARY ARTERY BYPASS GRAFT     X2. LIMA GRAFT AND A RIMA GRAFT.   INGUINAL HERNIA REPAIR Left    Negative stress echo  08/11/2009   US  ECHOCARDIOGRAPHY  03/2011   Normal EF, mild aortic regurgitation, mild MR, LAE, mildly dilated RV, RAE, increased left atrial pressure, moderate to severe TR and mildly increased  PA systolic pressure.    Patient Active Problem List   Diagnosis Date Noted   Trochanteric bursitis, right hip 08/23/2021   CKD (chronic kidney disease), stage III (HCC) 04/01/2021   Positive anti-CCP test 11/24/2020   Hand deformity 11/24/2020   Pulmonary fibrosis (HCC) 08/24/2020   Diverticulosis of colon 08/24/2020   Melena 08/14/2020   Cough in adult 08/14/2020   Left rotator cuff tear arthropathy 09/16/2019   Other fatigue 03/06/2019   Alcoholic peripheral neuropathy 03/12/2018   Sciatica, right side 10/11/2017   Acute pain of right shoulder 03/02/2017   Impingement syndrome of right shoulder 03/02/2017   Anemia due to chronic illness 03/13/2014   Monoclonal gammopathy    Lung nodules 03/11/2013   Weight loss,  non-intentional 01/28/2013   CAD (coronary artery disease) 08/03/2011   HTN (hypertension) 08/03/2011   Hyperlipidemia 08/03/2011   Valvular heart disease 08/03/2011   REFERRING PROVIDER: Eduard Hanlon  REFERRING DIAG: Spinal stenosis of lumbar region with neurogenic claudication.  Right lumbar radiculopath  Rationale for Evaluation and Treatment: Rehabilitation  THERAPY DIAG:  Other low back pain  Abnormal posture  Muscle spasm of back  ONSET DATE: Ongoing.  Gradual.    SUBJECTIVE:  SUBJECTIVE STATEMENT: Temporary relief of pain after last visit.  With pain medication pain at a 2 today.    PERTINENT HISTORY:  Stroke, 4 prior lumbar surgeries.    PAIN:  Are you having pain? Yes: NPRS scale: 2/10.   Pain location: Bilateral LB/upper glut. Pain description: Ache and sore. Aggravating factors: As above. Relieving factors: As above.    PRECAUTIONS: Fall.  He is using his cane today and reports no falls with cane.    RED FLAGS: None   WEIGHT BEARING RESTRICTIONS: No  FALLS:  Has patient fallen in last 6 months? Yes. Number of falls He states he fell once after his lumbar surgery in February.  Reports no falls using cane which it is recommended he use at all times.    LIVING ENVIRONMENT: Lives with: lives with their spouse Lives in: House/apartment Stairs: 3 steps to enter. Has following equipment at home: Single point cane  OCCUPATION: Retired   PLOF: Independent with basic ADLs and Independent with household mobility with device  PATIENT GOALS: Decrease back pain.      OBJECTIVE:   PATIENT SURVEYS:  ODI:  25/50.    POSTURE: rounded shoulders, forward head, decreased lumbar lordosis, and increased thoracic kyphosis  PALPATION: CC is bilateral tenderness over bilateral  upper gluteal musculature.  His bilateral lumbar musculature is very taut to palpation.    LUMBAR ROM:  Decreased by 25% and extension to neutral.    LOWER EXTREMITY MMT:   Right dorsiflexion dorsiflexion 1 to 1+/5, knee strength is 4+/5.    GAIT: Steppage gait due to right foot drop using a straight cane.    TREATMENT DATE:   07/03/24:                                     EXERCISE LOG  Exercise Repetitions and Resistance Comments  Nustep  Level 3 x 15 minutes    Rockerboard  With core activation (draw-in) x 4 minutes   Ball squeezes 3 minutes   Seated hip abd Red T-band x 3 minutes    Seated ham curls 2 minutes each side   LAQ's  2# (alternating) x  5 minutes   Seated marching 2# x 4 minutes      07/01/24:  Right sdly position with folded pillow between knees for comfort: Combo e'stim/US  at 1.50 W/CM2 x 12 minutes f/b STW/M x 11 minutes f/b HMP and IFC at 80-150 Hz on 40% scan x 20 minutes.    06/26/24 Nustep L1 LEs x 10 min Sitting thoracolumbar ext x10 Sitting ant/post pelvic tilt 2x10 Sitting pball press down x10 Sitting pball press down for oblique iso x10 Sitting figure 4 stretch x30 Sitting piriformis stretch x30 E-stim premod intensity to pt tolerance x 15 min with moist heat  06/24/24;  HMP and IFC at 80-150 Hz on 40% scan x 17 minutes.      Normal modality response following removal of modality  PATIENT EDUCATION:  Education details:  Person educated:  International aid/development worker:  Education comprehension:   HOME EXERCISE PROGRAM: Access Code: [email or web address] URL: [email or web address] Date: 06/26/2024 Prepared by: Gellen April Earnie Starring  Exercises - Seated Thoracic Lumbar Extension with Pectoralis Stretch  - 1 x daily - 7 x weekly - 1 sets - 10 reps - Seated Pelvic Tilt  - 1 x daily - 7 x weekly - 1 sets - 10 reps - Seated Figure  4 Piriformis Stretch  - 3 x daily - 7 x weekly - 2 sets - 30 sec hold - Seated Piriformis Stretch  - 3 x daily - 7 x weekly - 2 sets - 30 sec hold  ASSESSMENT:  CLINICAL IMPRESSION: Patient very motivated during his session.  He performed exercises with very good technique and without complaint.  Recommended he consider using a FWW for additional safety as he does not wear his AFO due to shin pain.    OBJECTIVE IMPAIRMENTS: Abnormal gait, decreased activity tolerance, decreased ROM, decreased strength, increased muscle spasms, postural dysfunction, and pain.   ACTIVITY LIMITATIONS: carrying, lifting, bending, standing, and locomotion level  PARTICIPATION LIMITATIONS: meal prep, cleaning, laundry, community activity, and yard work  PERSONAL FACTORS: Time since onset of injury/illness/exacerbation and 1-2 comorbidities: prior lumbar surgeries, stroke are also affecting patient's functional outcome.   REHAB POTENTIAL: Good  CLINICAL DECISION MAKING: Stable/uncomplicated  EVALUATION COMPLEXITY: Low   GOALS:   LONG TERM GOALS: Target date: 08/05/24  Ind with a HEP.  Goal status: INITIAL  2.  Walk a community distance with pain not > 3/10.  Goal status: INITIAL  3.  Improve ODI score by at least 6 points.  Goal status: INITIAL  4.  Perform ADL's with pain not > 3/10.  Goal status: INITIAL   PLAN:  PT FREQUENCY: 2x/week  PT DURATION: 6 weeks  PLANNED INTERVENTIONS: 97110-Therapeutic exercises, 97530- Therapeutic activity, [id number]- Neuromuscular re-education, 97535- Self Care, 02859- Manual therapy, G0283- Electrical stimulation (unattended), 97035- Ultrasound, 79439 (1-2 muscles), 20561 (3+ muscles)- Dry Needling, Patient/Family education, and Moist heat.  PLAN FOR NEXT SESSION: Combo e'stim/US , STW/M, core exercise progression.     Marian Grandt, [place], PT 07/03/2024, 1:14 PM

## 2024-07-03 NOTE — Telephone Encounter (Signed)
   Received call from my rhythm about a abnormal ZIO result.  Called and spoke with rep.  Patient had 13 runs of AV block including 2 runs of complete heart block.  First episode of complete heart block was on 06/21/2024 at 1:59 PM with a heart rate of 36 bpm for 11.3 seconds.  Second episode of complete heart block was on 9/21 at 12:37 PM with a heart rate of 38 bpm for 7.2 seconds.  Called and spoke with patient.  He was completely asymptomatic for the entire time he wore the monitor.  He denies any symptoms right now.  Reviewed chart.  Similar results were found on monitor in 05/2024.  He saw [facility] on 06/11/2024 for this.  Case was discussed with EP who recommended repeating a 7-day monitor since he had been off of beta-blocker and referral to EP.  Given he is completely asymptomatic and only had short runs of complete heart block, do not think he needs to go to the ED right now.  However, I discussed ED precautions.   I cannot tell if an EP referral has been made or not.  I will CC this telephone note to the EP scheduling pool to see if they can help get patient an appointment with one of the EP MDs sooner rather than later.  Marcellus Pulliam E Korrina Zern, PA-C 07/03/2024 7:23 PM

## 2024-07-04 ENCOUNTER — Ambulatory Visit (INDEPENDENT_AMBULATORY_CARE_PROVIDER_SITE_OTHER): Admitting: *Deleted

## 2024-07-04 DIAGNOSIS — R0600 Dyspnea, unspecified: Secondary | ICD-10-CM

## 2024-07-04 DIAGNOSIS — J84112 Idiopathic pulmonary fibrosis: Secondary | ICD-10-CM | POA: Diagnosis not present

## 2024-07-04 LAB — PULMONARY FUNCTION TEST
DL/VA % pred: 66 %
DL/VA: 2.56 ml/min/mmHg/L
DLCO cor % pred: 51 %
DLCO cor: 11.92 ml/min/mmHg
DLCO unc % pred: 45 %
DLCO unc: 10.51 ml/min/mmHg
FEF 25-75 Post: 1.77 L/s
FEF 25-75 Pre: 1.17 L/s
FEF2575-%Change-Post: 51 %
FEF2575-%Pred-Post: 108 %
FEF2575-%Pred-Pre: 71 %
FEV1-%Change-Post: 7 %
FEV1-%Pred-Post: 85 %
FEV1-%Pred-Pre: 79 %
FEV1-Post: 2.16 L
FEV1-Pre: 2.02 L
FEV1FVC-%Change-Post: 0 %
FEV1FVC-%Pred-Pre: 99 %
FEV6-%Change-Post: 6 %
FEV6-%Pred-Post: 89 %
FEV6-%Pred-Pre: 84 %
FEV6-Post: 3.03 L
FEV6-Pre: 2.84 L
FEV6FVC-%Change-Post: 0 %
FEV6FVC-%Pred-Post: 106 %
FEV6FVC-%Pred-Pre: 107 %
FVC-%Change-Post: 7 %
FVC-%Pred-Post: 84 %
FVC-%Pred-Pre: 78 %
FVC-Post: 3.09 L
FVC-Pre: 2.87 L
Post FEV1/FVC ratio: 70 %
Post FEV6/FVC ratio: 98 %
Pre FEV1/FVC ratio: 70 %
Pre FEV6/FVC Ratio: 99 %
RV % pred: 102 %
RV: 2.76 L
TLC % pred: 82 %
TLC: 5.66 L

## 2024-07-04 NOTE — Patient Instructions (Signed)
 Full PFT performed today.

## 2024-07-04 NOTE — Telephone Encounter (Signed)
 Hi Callie,   The referral for Lum Louis, NP from his visit on 9/9 - we have called the patient to get him scheduled, but unfortunately, we were only able to leave a voicemail. As of today, he hasn't returned our call. I just called again - left a voicemail on home and mobile. To prevent his appointment from being pushed out any further, I have gone ahead and scheduled him with Dr. Inocencio on 10/23. I will keep trying him to make him aware of the appointment.  Cassie

## 2024-07-04 NOTE — Progress Notes (Signed)
 Full PFT performed today.

## 2024-07-05 NOTE — Telephone Encounter (Signed)
 Thanks so much Cassie!  ~Mitchell Fields

## 2024-07-08 ENCOUNTER — Ambulatory Visit: Admitting: Physical Therapy

## 2024-07-08 ENCOUNTER — Encounter: Payer: Self-pay | Admitting: Physical Therapy

## 2024-07-08 DIAGNOSIS — R293 Abnormal posture: Secondary | ICD-10-CM

## 2024-07-08 DIAGNOSIS — R2689 Other abnormalities of gait and mobility: Secondary | ICD-10-CM

## 2024-07-08 DIAGNOSIS — M62830 Muscle spasm of back: Secondary | ICD-10-CM

## 2024-07-08 DIAGNOSIS — M5459 Other low back pain: Secondary | ICD-10-CM | POA: Diagnosis not present

## 2024-07-08 DIAGNOSIS — R2681 Unsteadiness on feet: Secondary | ICD-10-CM

## 2024-07-08 DIAGNOSIS — R001 Bradycardia, unspecified: Secondary | ICD-10-CM | POA: Diagnosis not present

## 2024-07-08 DIAGNOSIS — M6281 Muscle weakness (generalized): Secondary | ICD-10-CM

## 2024-07-08 NOTE — Therapy (Signed)
 OUTPATIENT PHYSICAL THERAPY TREATMENT   Patient Name: Mitchell Fields MRN: [id number] DOB:1936/09/03, 88 y.o., male Today's Date: 07/08/2024  END OF SESSION:  PT End of Session - 07/08/24 1429     Visit Number 5    Number of Visits 12    Date for Recertification  08/05/24    Activity Tolerance Patient tolerated treatment well    Behavior During Therapy [facility] for tasks assessed/performed              Past Medical History:  Diagnosis Date   Anemia    Arthritis    Back pain    Bursitis of left hip    Coronary artery disease    TWO VESSEL BYPASS SURGERY in January 1995   Diverticulosis    Hyperlipidemia    Hypertension    Monoclonal gammopathy    Neuropathy    Past Surgical History:  Procedure Laterality Date   ADENOIDECTOMY     BACK SURGERY  1985, 1995, 2002   X3   BACK SURGERY  11/2023   COLONOSCOPY     CORONARY ARTERY BYPASS GRAFT     X2. LIMA GRAFT AND A RIMA GRAFT.   INGUINAL HERNIA REPAIR Left    Negative stress echo  08/11/2009   US  ECHOCARDIOGRAPHY  03/2011   Normal EF, mild aortic regurgitation, mild MR, LAE, mildly dilated RV, RAE, increased left atrial pressure, moderate to severe TR and mildly increased  PA systolic pressure.    Patient Active Problem List   Diagnosis Date Noted   Trochanteric bursitis, right hip 08/23/2021   CKD (chronic kidney disease), stage III (HCC) 04/01/2021   Positive anti-CCP test 11/24/2020   Hand deformity 11/24/2020   Pulmonary fibrosis (HCC) 08/24/2020   Diverticulosis of colon 08/24/2020   Melena 08/14/2020   Cough in adult 08/14/2020   Left rotator cuff tear arthropathy 09/16/2019   Other fatigue 03/06/2019   Alcoholic peripheral neuropathy 03/12/2018   Sciatica, right side 10/11/2017   Acute pain of right shoulder 03/02/2017   Impingement syndrome of right shoulder 03/02/2017   Anemia due to chronic illness 03/13/2014   Monoclonal gammopathy    Lung nodules 03/11/2013   Weight loss, non-intentional 01/28/2013    CAD (coronary artery disease) 08/03/2011   HTN (hypertension) 08/03/2011   Hyperlipidemia 08/03/2011   Valvular heart disease 08/03/2011   REFERRING PROVIDER: Eduard Hanlon  REFERRING DIAG: Spinal stenosis of lumbar region with neurogenic claudication.  Right lumbar radiculopath  Rationale for Evaluation and Treatment: Rehabilitation  THERAPY DIAG:  Other low back pain  Abnormal posture  Muscle spasm of back  Muscle weakness (generalized)  Unsteadiness on feet  Other abnormalities of gait and mobility  ONSET DATE: Ongoing.  Gradual.    SUBJECTIVE:  SUBJECTIVE STATEMENT: Pt states his back was hurting some this morning when he did his exercise class. Used the nustep for 30 min already this morning. Has been using his TENS this morning.   PERTINENT HISTORY:  Stroke, 4 prior lumbar surgeries.    PAIN:  Are you having pain? Yes: NPRS scale: 2/10.   Pain location: Bilateral LB/upper glut. Pain description: Ache and sore. Aggravating factors: As above. Relieving factors: As above.    PRECAUTIONS: Fall.  He is using his cane today and reports no falls with cane.    RED FLAGS: None   WEIGHT BEARING RESTRICTIONS: No  FALLS:  Has patient fallen in last 6 months? Yes. Number of falls He states he fell once after his lumbar surgery in February.  Reports no falls using cane which it is recommended he use at all times.    LIVING ENVIRONMENT: Lives with: lives with their spouse Lives in: House/apartment Stairs: 3 steps to enter. Has following equipment at home: Single point cane  OCCUPATION: Retired   PLOF: Independent with basic ADLs and Independent with household mobility with device  PATIENT GOALS: Decrease back pain.      OBJECTIVE:   PATIENT SURVEYS:  ODI:  25/50.     POSTURE: rounded shoulders, forward head, decreased lumbar lordosis, and increased thoracic kyphosis  PALPATION: CC is bilateral tenderness over bilateral upper gluteal musculature.  His bilateral lumbar musculature is very taut to palpation.    LUMBAR ROM:  Decreased by 25% and extension to neutral.    LOWER EXTREMITY MMT:   Right dorsiflexion dorsiflexion 1 to 1+/5, knee strength is 4+/5.    GAIT: Steppage gait due to right foot drop using a straight cane.    TREATMENT DATE:  07/08/24:                                    EXERCISE LOG  Exercise Repetitions and Resistance Comments  Nustep  Level 3 x 10 minutes    Lumbar ext against counter x10   L stretch at counter  X30   Standing hip flexor stretch 2X30   Lumbar ext 40# 2x10   Ab crunch 40# 2x10   Savasana X2 min Small rolled towel under pelvic to maintain ant pelvic tilt  Supine ant/post pelvic tilt x10   Supine heel press for glute iso 2x10   Supine hip IR/ER 2x10     IFC at 80-150 Hz on 40% scan x 15 minutes.    07/03/24:                                   EXERCISE LOG  Exercise Repetitions and Resistance Comments  Nustep  Level 3 x 15 minutes    Rockerboard  With core activation (draw-in) x 4 minutes   Ball squeezes 3 minutes   Seated hip abd Red T-band x 3 minutes    Seated ham curls 2 minutes each side   LAQ's  2# (alternating) x  5 minutes   Seated marching 2# x 4 minutes      07/01/24:  Right sdly position with folded pillow between knees for comfort: Combo e'stim/US  at 1.50 W/CM2 x 12 minutes f/b STW/M x 11 minutes f/b HMP and IFC at 80-150 Hz on 40% scan x 20 minutes.    06/26/24 Nustep L1 LEs x 10 min Sitting  thoracolumbar ext x10 Sitting ant/post pelvic tilt 2x10 Sitting pball press down x10 Sitting pball press down for oblique iso x10 Sitting figure 4 stretch x30 Sitting piriformis stretch x30 E-stim premod intensity to pt tolerance x 15 min with moist heat  06/24/24;  HMP and IFC at 80-150  Hz on 40% scan x 17 minutes.      Normal modality response following removal of modality                                                                                                                             PATIENT EDUCATION:  Education details:  Person educated:  International aid/development worker:  Education comprehension:   HOME EXERCISE PROGRAM: Access Code: [email or web address] URL: [email or web address] Date: 06/26/2024 Prepared by: Nalaya Wojdyla April Earnie Starring  Exercises - Seated Thoracic Lumbar Extension with Pectoralis Stretch  - 1 x daily - 7 x weekly - 1 sets - 10 reps - Seated Pelvic Tilt  - 1 x daily - 7 x weekly - 1 sets - 10 reps - Seated Figure 4 Piriformis Stretch  - 3 x daily - 7 x weekly - 2 sets - 30 sec hold - Seated Piriformis Stretch  - 3 x daily - 7 x weekly - 2 sets - 30 sec hold  ASSESSMENT:  CLINICAL IMPRESSION: Mr. Logyn continues to be highly motivated to exercise. Working on improving hip ext, lumbar ext, core and pelvic mobility/strengthening. Added hip flexor stretching to decrease pt's forward flexed posture. Continued e-stim for pain management.   OBJECTIVE IMPAIRMENTS: Abnormal gait, decreased activity tolerance, decreased ROM, decreased strength, increased muscle spasms, postural dysfunction, and pain.   ACTIVITY LIMITATIONS: carrying, lifting, bending, standing, and locomotion level  PARTICIPATION LIMITATIONS: meal prep, cleaning, laundry, community activity, and yard work  PERSONAL FACTORS: Time since onset of injury/illness/exacerbation and 1-2 comorbidities: prior lumbar surgeries, stroke are also affecting patient's functional outcome.   REHAB POTENTIAL: Good  CLINICAL DECISION MAKING: Stable/uncomplicated  EVALUATION COMPLEXITY: Low   GOALS:   LONG TERM GOALS: Target date: 08/05/24  Ind with a HEP.  Goal status: INITIAL  2.  Walk a community distance with pain not > 3/10.  Goal status: INITIAL  3.  Improve ODI score by at least 6  points.  Goal status: INITIAL  4.  Perform ADL's with pain not > 3/10.  Goal status: INITIAL   PLAN:  PT FREQUENCY: 2x/week  PT DURATION: 6 weeks  PLANNED INTERVENTIONS: 97110-Therapeutic exercises, 97530- Therapeutic activity, [id number]- Neuromuscular re-education, 97535- Self Care, 02859- Manual therapy, G0283- Electrical stimulation (unattended), 97035- Ultrasound, 79439 (1-2 muscles), 20561 (3+ muscles)- Dry Needling, Patient/Family education, and Moist heat.  PLAN FOR NEXT SESSION: Combo e'stim/US , STW/M, core exercise progression.     Salahuddin Arismendez April Ma L Adoria Kawamoto, PT 07/08/2024, 2:34 PM

## 2024-07-10 ENCOUNTER — Ambulatory Visit: Admitting: Orthopaedic Surgery

## 2024-07-10 ENCOUNTER — Encounter: Payer: Self-pay | Admitting: Orthopaedic Surgery

## 2024-07-10 ENCOUNTER — Other Ambulatory Visit

## 2024-07-10 DIAGNOSIS — G8929 Other chronic pain: Secondary | ICD-10-CM | POA: Diagnosis not present

## 2024-07-10 DIAGNOSIS — M545 Low back pain, unspecified: Secondary | ICD-10-CM

## 2024-07-10 MED ORDER — HYDROCODONE-ACETAMINOPHEN 7.5-325 MG PO TABS: 1.0000 | ORAL_TABLET | Freq: Two times a day (BID) | ORAL | 0 refills | Status: AC | PRN

## 2024-07-10 NOTE — Progress Notes (Signed)
 The patient is an 88 year old gentleman not seen for many years now.  He comes in with significant low back pain and he points to the upper aspect of his pelvis and the lower lumbar spine as source of his pain medial and lateral.  He is requesting a steroid injection but he understands that we do not provide random steroid injections in the skin in this area and use of this is done by a physiatrist.  He does let me know that he had significant spine surgery in [place] just in February of this year.  He is really requesting hydrocodone .  He is on Plavix so he cannot take very traditional anti-inflammatories.  He is in therapy as well.  He does point to the lower aspect of his lumbar spine as a source of his pain both medial and laterally.  He has a well-healed midline surgical incision from his previous surgery.  2 views of the lumbar spine shows severe degenerative changes at multiple levels throughout the lumbar spine and severe degenerative disc disease as well as degenerative scoliosis.  I told him that this would be the last prescription that I can provide in terms of hydrocodone  that this is something he would need to get through either a spine specialist or even considering a referral to chronic pain management.  Follow

## 2024-07-11 ENCOUNTER — Ambulatory Visit: Admitting: Physical Therapy

## 2024-07-11 ENCOUNTER — Encounter: Payer: Self-pay | Admitting: Physical Therapy

## 2024-07-11 DIAGNOSIS — M62830 Muscle spasm of back: Secondary | ICD-10-CM

## 2024-07-11 DIAGNOSIS — R293 Abnormal posture: Secondary | ICD-10-CM

## 2024-07-11 DIAGNOSIS — M5459 Other low back pain: Secondary | ICD-10-CM

## 2024-07-11 DIAGNOSIS — R2681 Unsteadiness on feet: Secondary | ICD-10-CM

## 2024-07-11 DIAGNOSIS — M6281 Muscle weakness (generalized): Secondary | ICD-10-CM

## 2024-07-11 NOTE — Therapy (Signed)
 OUTPATIENT PHYSICAL THERAPY TREATMENT   Patient Name: Mitchell Fields MRN: [id number] DOB:11-Jun-1936, 88 y.o., male Today's Date: 07/11/2024  END OF SESSION:  PT End of Session - 07/11/24 1304     Visit Number 6    Number of Visits 12    Date for Recertification  08/05/24    PT Start Time 1300    PT Stop Time 1340    PT Time Calculation (min) 40 min    Activity Tolerance Patient tolerated treatment well    Behavior During Therapy [facility] for tasks assessed/performed           Past Medical History:  Diagnosis Date   Anemia    Arthritis    Back pain    Bursitis of left hip    Coronary artery disease    TWO VESSEL BYPASS SURGERY in January 1995   Diverticulosis    Hyperlipidemia    Hypertension    Monoclonal gammopathy    Neuropathy    Past Surgical History:  Procedure Laterality Date   ADENOIDECTOMY     BACK SURGERY  1985, 1995, 2002   X3   BACK SURGERY  11/2023   COLONOSCOPY     CORONARY ARTERY BYPASS GRAFT     X2. LIMA GRAFT AND A RIMA GRAFT.   INGUINAL HERNIA REPAIR Left    Negative stress echo  08/11/2009   US  ECHOCARDIOGRAPHY  03/2011   Normal EF, mild aortic regurgitation, mild MR, LAE, mildly dilated RV, RAE, increased left atrial pressure, moderate to severe TR and mildly increased  PA systolic pressure.    Patient Active Problem List   Diagnosis Date Noted   Trochanteric bursitis, right hip 08/23/2021   CKD (chronic kidney disease), stage III (HCC) 04/01/2021   Positive anti-CCP test 11/24/2020   Hand deformity 11/24/2020   Pulmonary fibrosis (HCC) 08/24/2020   Diverticulosis of colon 08/24/2020   Melena 08/14/2020   Cough in adult 08/14/2020   Left rotator cuff tear arthropathy 09/16/2019   Other fatigue 03/06/2019   Alcoholic peripheral neuropathy 03/12/2018   Sciatica, right side 10/11/2017   Acute pain of right shoulder 03/02/2017   Impingement syndrome of right shoulder 03/02/2017   Anemia due to chronic illness 03/13/2014   Monoclonal  gammopathy    Lung nodules 03/11/2013   Weight loss, non-intentional 01/28/2013   CAD (coronary artery disease) 08/03/2011   HTN (hypertension) 08/03/2011   Hyperlipidemia 08/03/2011   Valvular heart disease 08/03/2011   REFERRING PROVIDER: Eduard Hanlon  REFERRING DIAG: Spinal stenosis of lumbar region with neurogenic claudication.  Right lumbar radiculopath  Rationale for Evaluation and Treatment: Rehabilitation  THERAPY DIAG:  Other low back pain  Abnormal posture  Muscle spasm of back  Muscle weakness (generalized)  Unsteadiness on feet  ONSET DATE: Ongoing.  Gradual.    SUBJECTIVE:  SUBJECTIVE STATEMENT: Pt states he is sore this afternoon. Did his exercises on the floor. Pt states pain isn't too bad this afternoon.   PERTINENT HISTORY:  Stroke, 4 prior lumbar surgeries.    PAIN:  Are you having pain? Yes: NPRS scale: 1/10.   Pain location: Bilateral LB/upper glut. Pain description: Ache and sore. Aggravating factors: As above. Relieving factors: As above.    PRECAUTIONS: Fall.  He is using his cane today and reports no falls with cane.    RED FLAGS: None   WEIGHT BEARING RESTRICTIONS: No  FALLS:  Has patient fallen in last 6 months? Yes. Number of falls He states he fell once after his lumbar surgery in February.  Reports no falls using cane which it is recommended he use at all times.    LIVING ENVIRONMENT: Lives with: lives with their spouse Lives in: House/apartment Stairs: 3 steps to enter. Has following equipment at home: Single point cane  OCCUPATION: Retired   PLOF: Independent with basic ADLs and Independent with household mobility with device  PATIENT GOALS: Decrease back pain.      OBJECTIVE:   PATIENT SURVEYS:  ODI:  25/50.    POSTURE: rounded  shoulders, forward head, decreased lumbar lordosis, and increased thoracic kyphosis  PALPATION: CC is bilateral tenderness over bilateral upper gluteal musculature.  His bilateral lumbar musculature is very taut to palpation.    LUMBAR ROM:  Decreased by 25% and extension to neutral.    LOWER EXTREMITY MMT:   Right dorsiflexion dorsiflexion 1 to 1+/5, knee strength is 4+/5.    GAIT: Steppage gait due to right foot drop using a straight cane.    TREATMENT DATE:  07/11/24:                                    EXERCISE LOG  Exercise Repetitions and Resistance Comments  Nustep  Level 3-4 x 15 minutes  Pt periodically increased/decreased resistance for interval training  Lumbar ext against counter x10   Standing hip flexor stretch 2X30   Standing low trap setting off cabinets x10   Lumbar ext 40# 2x10   Ab crunch 40# 2x10   Leg press 2 2x10                 IFC at 80-150 Hz on 40% scan x 15 minutes.  Bilat lumbosacral paraspinals  07/08/24:                                    EXERCISE LOG  Exercise Repetitions and Resistance Comments  Nustep  Level 3 x 10 minutes    Lumbar ext against counter x10   L stretch at counter  X30   Standing hip flexor stretch 2X30   Lumbar ext 40# 2x10   Ab crunch 40# 2x10   Savasana X2 min Small rolled towel under pelvic to maintain ant pelvic tilt  Supine ant/post pelvic tilt x10   Supine heel press for glute iso 2x10   Supine hip IR/ER 2x10     IFC at 80-150 Hz on 40% scan x 15 minutes.    07/03/24:                                   EXERCISE LOG  Exercise Repetitions  and Resistance Comments  Nustep  Level 3 x 15 minutes    Rockerboard  With core activation (draw-in) x 4 minutes   Ball squeezes 3 minutes   Seated hip abd Red T-band x 3 minutes    Seated ham curls 2 minutes each side   LAQ's  2# (alternating) x  5 minutes   Seated marching 2# x 4 minutes      07/01/24:  Right sdly position with folded pillow between knees for  comfort: Combo e'stim/US  at 1.50 W/CM2 x 12 minutes f/b STW/M x 11 minutes f/b HMP and IFC at 80-150 Hz on 40% scan x 20 minutes.                                                                                                                PATIENT EDUCATION:  Education details:  Person educated:  International aid/development worker:  Education comprehension:   HOME EXERCISE PROGRAM: Access Code: [email or web address] URL: [email or web address] Date: 06/26/2024 Prepared by: Ferman Basilio April Earnie Starring  Exercises - Seated Thoracic Lumbar Extension with Pectoralis Stretch  - 1 x daily - 7 x weekly - 1 sets - 10 reps - Seated Pelvic Tilt  - 1 x daily - 7 x weekly - 1 sets - 10 reps - Seated Figure 4 Piriformis Stretch  - 3 x daily - 7 x weekly - 2 sets - 30 sec hold - Seated Piriformis Stretch  - 3 x daily - 7 x weekly - 2 sets - 30 sec hold  ASSESSMENT:  CLINICAL IMPRESSION: Mr. Sabre continues to be highly motivated to exercise. Continued to work on improving pelvic, lumbar, trunk, and hip mobility and strengthening. Shoulders sore when trying to work on low trap setting/UE flexion on cabinets. Focus on improving standing postural stability.   OBJECTIVE IMPAIRMENTS: Abnormal gait, decreased activity tolerance, decreased ROM, decreased strength, increased muscle spasms, postural dysfunction, and pain.   ACTIVITY LIMITATIONS: carrying, lifting, bending, standing, and locomotion level  PARTICIPATION LIMITATIONS: meal prep, cleaning, laundry, community activity, and yard work  PERSONAL FACTORS: Time since onset of injury/illness/exacerbation and 1-2 comorbidities: prior lumbar surgeries, stroke are also affecting patient's functional outcome.   REHAB POTENTIAL: Good  CLINICAL DECISION MAKING: Stable/uncomplicated  EVALUATION COMPLEXITY: Low   GOALS:   LONG TERM GOALS: Target date: 08/05/24  Ind with a HEP.  Goal status: INITIAL  2.  Walk a community distance with pain not > 3/10.  Goal  status: INITIAL  3.  Improve ODI score by at least 6 points.  Goal status: INITIAL  4.  Perform ADL's with pain not > 3/10.  Goal status: INITIAL   PLAN:  PT FREQUENCY: 2x/week  PT DURATION: 6 weeks  PLANNED INTERVENTIONS: 97110-Therapeutic exercises, 97530- Therapeutic activity, [id number]- Neuromuscular re-education, 97535- Self Care, 02859- Manual therapy, G0283- Electrical stimulation (unattended), 97035- Ultrasound, 79439 (1-2 muscles), 20561 (3+ muscles)- Dry Needling, Patient/Family education, and Moist heat.  PLAN FOR NEXT SESSION: Combo e'stim/US , STW/M, core exercise progression.     Ellarae Nevitt April Ma L Dejae Bernet, PT 07/11/2024,  1:05 PM

## 2024-07-11 NOTE — Progress Notes (Signed)
 Called and spoke to pt - advised of PFT results per Dr. Theophilus. Pt verbalized understanding, NFN.

## 2024-07-16 ENCOUNTER — Ambulatory Visit: Admitting: Physical Therapy

## 2024-07-16 DIAGNOSIS — M62830 Muscle spasm of back: Secondary | ICD-10-CM

## 2024-07-16 DIAGNOSIS — R293 Abnormal posture: Secondary | ICD-10-CM

## 2024-07-16 DIAGNOSIS — M5459 Other low back pain: Secondary | ICD-10-CM

## 2024-07-16 NOTE — Therapy (Signed)
 OUTPATIENT PHYSICAL THERAPY TREATMENT   Patient Name: Mitchell Fields MRN: [id number] DOB:1936-03-28, 88 y.o., male Today's Date: 07/16/2024  END OF SESSION:  PT End of Session - 07/16/24 1306     Visit Number 7    Number of Visits 12    Date for Recertification  08/05/24    PT Start Time 1258    PT Stop Time 1358    PT Time Calculation (min) 60 min    Activity Tolerance Patient tolerated treatment well    Behavior During Therapy [facility] for tasks assessed/performed           Past Medical History:  Diagnosis Date   Anemia    Arthritis    Back pain    Bursitis of left hip    Coronary artery disease    TWO VESSEL BYPASS SURGERY in January 1995   Diverticulosis    Hyperlipidemia    Hypertension    Monoclonal gammopathy    Neuropathy    Past Surgical History:  Procedure Laterality Date   ADENOIDECTOMY     BACK SURGERY  1985, 1995, 2002   X3   BACK SURGERY  11/2023   COLONOSCOPY     CORONARY ARTERY BYPASS GRAFT     X2. LIMA GRAFT AND A RIMA GRAFT.   INGUINAL HERNIA REPAIR Left    Negative stress echo  08/11/2009   US  ECHOCARDIOGRAPHY  03/2011   Normal EF, mild aortic regurgitation, mild MR, LAE, mildly dilated RV, RAE, increased left atrial pressure, moderate to severe TR and mildly increased  PA systolic pressure.    Patient Active Problem List   Diagnosis Date Noted   Trochanteric bursitis, right hip 08/23/2021   CKD (chronic kidney disease), stage III (HCC) 04/01/2021   Positive anti-CCP test 11/24/2020   Hand deformity 11/24/2020   Pulmonary fibrosis (HCC) 08/24/2020   Diverticulosis of colon 08/24/2020   Melena 08/14/2020   Cough in adult 08/14/2020   Left rotator cuff tear arthropathy 09/16/2019   Other fatigue 03/06/2019   Alcoholic peripheral neuropathy 03/12/2018   Sciatica, right side 10/11/2017   Acute pain of right shoulder 03/02/2017   Impingement syndrome of right shoulder 03/02/2017   Anemia due to chronic illness 03/13/2014   Monoclonal  gammopathy    Lung nodules 03/11/2013   Weight loss, non-intentional 01/28/2013   CAD (coronary artery disease) 08/03/2011   HTN (hypertension) 08/03/2011   Hyperlipidemia 08/03/2011   Valvular heart disease 08/03/2011   REFERRING PROVIDER: Eduard Hanlon  REFERRING DIAG: Spinal stenosis of lumbar region with neurogenic claudication.  Right lumbar radiculopath  Rationale for Evaluation and Treatment: Rehabilitation  THERAPY DIAG:  Other low back pain  Abnormal posture  Muscle spasm of back  ONSET DATE: Ongoing.  Gradual.    SUBJECTIVE:  SUBJECTIVE STATEMENT: Pain around a 3.  PERTINENT HISTORY:  Stroke, 4 prior lumbar surgeries.    PAIN:  Are you having pain? Yes: NPRS scale: 3/10.   Pain location: Bilateral LB/upper glut. Pain description: Ache and sore. Aggravating factors: As above. Relieving factors: As above.    PRECAUTIONS: Fall.  He is using his cane today and reports no falls with cane.    RED FLAGS: None   WEIGHT BEARING RESTRICTIONS: No  FALLS:  Has patient fallen in last 6 months? Yes. Number of falls He states he fell once after his lumbar surgery in February.  Reports no falls using cane which it is recommended he use at all times.    LIVING ENVIRONMENT: Lives with: lives with their spouse Lives in: House/apartment Stairs: 3 steps to enter. Has following equipment at home: Single point cane  OCCUPATION: Retired   PLOF: Independent with basic ADLs and Independent with household mobility with device  PATIENT GOALS: Decrease back pain.      OBJECTIVE:   PATIENT SURVEYS:  ODI:  25/50.    POSTURE: rounded shoulders, forward head, decreased lumbar lordosis, and increased thoracic kyphosis  PALPATION: CC is bilateral tenderness over bilateral upper gluteal  musculature.  His bilateral lumbar musculature is very taut to palpation.    LUMBAR ROM:  Decreased by 25% and extension to neutral.    LOWER EXTREMITY MMT:   Right dorsiflexion dorsiflexion 1 to 1+/5, knee strength is 4+/5.    GAIT: Steppage gait due to right foot drop using a straight cane.    TREATMENT DATE:   07/16/24:                                     EXERCISE LOG  Exercise Repetitions and Resistance Comments  Nustep Level 3 x 15 minutes   Rockerboard  In parallel bars x 5 minutes   Airex balance pad In parallel bars x 5 minutes   Back ext 50# x 4 minutes    Ab curl machine 50# x 4 minutes    Seated lat PD Blue XTS to fatigue x 2   Scapular retraction Blue XTS to fatigue x 2   Add squeeze With ball x 3 minutes     IFC at 80-150 Hz on 40% scan x 15 minutes.  Bilat lumbosacral paraspinals  07/11/24:                                    EXERCISE LOG  Exercise Repetitions and Resistance Comments  Nustep  Level 3-4 x 15 minutes  Pt periodically increased/decreased resistance for interval training  Lumbar ext against counter x10   Standing hip flexor stretch 2X30   Standing low trap setting off cabinets x10   Lumbar ext 40# 2x10   Ab crunch 40# 2x10   Leg press 2 2x10                 IFC at 80-150 Hz on 40% scan x 15 minutes.  Bilat lumbosacral paraspinals  07/08/24:                                    EXERCISE LOG  Exercise Repetitions and Resistance Comments  Nustep  Level 3 x 10 minutes    Lumbar  ext against counter x10   L stretch at counter  X30   Standing hip flexor stretch 2X30   Lumbar ext 40# 2x10   Ab crunch 40# 2x10   Savasana X2 min Small rolled towel under pelvic to maintain ant pelvic tilt  Supine ant/post pelvic tilt x10   Supine heel press for glute iso 2x10   Supine hip IR/ER 2x10     IFC at 80-150 Hz on 40% scan x 15 minutes.    07/03/24:                                   EXERCISE LOG  Exercise Repetitions and Resistance Comments   Nustep  Level 3 x 15 minutes    Rockerboard  With core activation (draw-in) x 4 minutes   Ball squeezes 3 minutes   Seated hip abd Red T-band x 3 minutes    Seated ham curls 2 minutes each side   LAQ's  2# (alternating) x  5 minutes   Seated marching 2# x 4 minutes      07/01/24:  Right sdly position with folded pillow between knees for comfort: Combo e'stim/US  at 1.50 W/CM2 x 12 minutes f/b STW/M x 11 minutes f/b HMP and IFC at 80-150 Hz on 40% scan x 20 minutes.                                                                                                                PATIENT EDUCATION:  Education details:  Person educated:  International aid/development worker:  Education comprehension:   HOME EXERCISE PROGRAM: Access Code: [email or web address] URL: [email or web address] Date: 06/26/2024 Prepared by: Gellen April Earnie Starring  Exercises - Seated Thoracic Lumbar Extension with Pectoralis Stretch  - 1 x daily - 7 x weekly - 1 sets - 10 reps - Seated Pelvic Tilt  - 1 x daily - 7 x weekly - 1 sets - 10 reps - Seated Figure 4 Piriformis Stretch  - 3 x daily - 7 x weekly - 2 sets - 30 sec hold - Seated Piriformis Stretch  - 3 x daily - 7 x weekly - 2 sets - 30 sec hold  ASSESSMENT:  CLINICAL IMPRESSION: The patient continues to be highly motivated to exercise. Included seated lat pulldowns and scapular retraction with abdominal bracing.   OBJECTIVE IMPAIRMENTS: Abnormal gait, decreased activity tolerance, decreased ROM, decreased strength, increased muscle spasms, postural dysfunction, and pain.   ACTIVITY LIMITATIONS: carrying, lifting, bending, standing, and locomotion level  PARTICIPATION LIMITATIONS: meal prep, cleaning, laundry, community activity, and yard work  PERSONAL FACTORS: Time since onset of injury/illness/exacerbation and 1-2 comorbidities: prior lumbar surgeries, stroke are also affecting patient's functional outcome.   REHAB POTENTIAL: Good  CLINICAL DECISION MAKING:  Stable/uncomplicated  EVALUATION COMPLEXITY: Low   GOALS:   LONG TERM GOALS: Target date: 08/05/24  Ind with a HEP.  Goal status: INITIAL  2.  Walk a community distance with pain not > 3/10.  Goal status: INITIAL  3.  Improve ODI score by at least 6 points.  Goal status: INITIAL  4.  Perform ADL's with pain not > 3/10.  Goal status: INITIAL   PLAN:  PT FREQUENCY: 2x/week  PT DURATION: 6 weeks  PLANNED INTERVENTIONS: 97110-Therapeutic exercises, 97530- Therapeutic activity, [id number]- Neuromuscular re-education, 97535- Self Care, 02859- Manual therapy, G0283- Electrical stimulation (unattended), 97035- Ultrasound, 79439 (1-2 muscles), 20561 (3+ muscles)- Dry Needling, Patient/Family education, and Moist heat.  PLAN FOR NEXT SESSION: Combo e'stim/US , STW/M, core exercise progression.     Karole Oo, [place], PT 07/16/2024, 2:08 PM

## 2024-07-18 ENCOUNTER — Ambulatory Visit: Admitting: Physical Therapy

## 2024-07-18 DIAGNOSIS — M5459 Other low back pain: Secondary | ICD-10-CM

## 2024-07-18 DIAGNOSIS — M62830 Muscle spasm of back: Secondary | ICD-10-CM

## 2024-07-18 DIAGNOSIS — R293 Abnormal posture: Secondary | ICD-10-CM

## 2024-07-18 NOTE — Therapy (Signed)
 OUTPATIENT PHYSICAL THERAPY TREATMENT   Patient Name: Mitchell Fields MRN: [id number] DOB:09/02/1936, 88 y.o., male Today's Date: 07/18/2024  END OF SESSION:  PT End of Session - 07/18/24 1303     Visit Number 8    Number of Visits 12    Date for Recertification  08/05/24    PT Start Time 0100    PT Stop Time 0200    PT Time Calculation (min) 60 min    Activity Tolerance Patient tolerated treatment well    Behavior During Therapy [facility] for tasks assessed/performed           Past Medical History:  Diagnosis Date   Anemia    Arthritis    Back pain    Bursitis of left hip    Coronary artery disease    TWO VESSEL BYPASS SURGERY in January 1995   Diverticulosis    Hyperlipidemia    Hypertension    Monoclonal gammopathy    Neuropathy    Past Surgical History:  Procedure Laterality Date   ADENOIDECTOMY     BACK SURGERY  1985, 1995, 2002   X3   BACK SURGERY  11/2023   COLONOSCOPY     CORONARY ARTERY BYPASS GRAFT     X2. LIMA GRAFT AND A RIMA GRAFT.   INGUINAL HERNIA REPAIR Left    Negative stress echo  08/11/2009   US  ECHOCARDIOGRAPHY  03/2011   Normal EF, mild aortic regurgitation, mild MR, LAE, mildly dilated RV, RAE, increased left atrial pressure, moderate to severe TR and mildly increased  PA systolic pressure.    Patient Active Problem List   Diagnosis Date Noted   Trochanteric bursitis, right hip 08/23/2021   CKD (chronic kidney disease), stage III (HCC) 04/01/2021   Positive anti-CCP test 11/24/2020   Hand deformity 11/24/2020   Pulmonary fibrosis (HCC) 08/24/2020   Diverticulosis of colon 08/24/2020   Melena 08/14/2020   Cough in adult 08/14/2020   Left rotator cuff tear arthropathy 09/16/2019   Other fatigue 03/06/2019   Alcoholic peripheral neuropathy 03/12/2018   Sciatica, right side 10/11/2017   Acute pain of right shoulder 03/02/2017   Impingement syndrome of right shoulder 03/02/2017   Anemia due to chronic illness 03/13/2014   Monoclonal  gammopathy    Lung nodules 03/11/2013   Weight loss, non-intentional 01/28/2013   CAD (coronary artery disease) 08/03/2011   HTN (hypertension) 08/03/2011   Hyperlipidemia 08/03/2011   Valvular heart disease 08/03/2011   REFERRING PROVIDER: Eduard Hanlon  REFERRING DIAG: Spinal stenosis of lumbar region with neurogenic claudication.  Right lumbar radiculopath  Rationale for Evaluation and Treatment: Rehabilitation  THERAPY DIAG:  No diagnosis found.  ONSET DATE: Ongoing.  Gradual.    SUBJECTIVE:  SUBJECTIVE STATEMENT: Pain around a 4.5/10.  PERTINENT HISTORY:  Stroke, 4 prior lumbar surgeries.    PAIN:  Are you having pain? Yes: NPRS scale: 3/10.   Pain location: Bilateral LB/upper glut. Pain description: Ache and sore. Aggravating factors: As above. Relieving factors: As above.    PRECAUTIONS: Fall.  He is using his cane today and reports no falls with cane.    RED FLAGS: None   WEIGHT BEARING RESTRICTIONS: No  FALLS:  Has patient fallen in last 6 months? Yes. Number of falls He states he fell once after his lumbar surgery in February.  Reports no falls using cane which it is recommended he use at all times.    LIVING ENVIRONMENT: Lives with: lives with their spouse Lives in: House/apartment Stairs: 3 steps to enter. Has following equipment at home: Single point cane  OCCUPATION: Retired   PLOF: Independent with basic ADLs and Independent with household mobility with device  PATIENT GOALS: Decrease back pain.      OBJECTIVE:   PATIENT SURVEYS:  ODI:  25/50.    POSTURE: rounded shoulders, forward head, decreased lumbar lordosis, and increased thoracic kyphosis  PALPATION: CC is bilateral tenderness over bilateral upper gluteal musculature.  His bilateral lumbar  musculature is very taut to palpation.    LUMBAR ROM:  Decreased by 25% and extension to neutral.    LOWER EXTREMITY MMT:   Right dorsiflexion dorsiflexion 1 to 1+/5, knee strength is 4+/5.    GAIT: Steppage gait due to right foot drop using a straight cane.    TREATMENT DATE:   07/18/24:                                     EXERCISE LOG  Exercise Repetitions and Resistance Comments  Nustep Level 2 x 15 minutes (LE's only)   Back ext 50# x 4 minutes   Ab curls 50# x 4 minutes           STW/M x 15 minutes to lumbo-pelvic region f/b HMP and IFC at 80-150 Hz on 40% scan x 15 minutes.    07/16/24:                                     EXERCISE LOG  Exercise Repetitions and Resistance Comments  Nustep Level 3 x 15 minutes   Rockerboard  In parallel bars x 5 minutes   Airex balance pad In parallel bars x 5 minutes   Back ext 50# x 4 minutes    Ab curl machine 50# x 4 minutes    Seated lat PD Blue XTS to fatigue x 2   Scapular retraction Blue XTS to fatigue x 2   Add squeeze With ball x 3 minutes     IFC at 80-150 Hz on 40% scan x 15 minutes.  Bilat lumbosacral paraspinals  07/11/24:                                    EXERCISE LOG  Exercise Repetitions and Resistance Comments  Nustep  Level 3-4 x 15 minutes  Pt periodically increased/decreased resistance for interval training  Lumbar ext against counter x10   Standing hip flexor stretch 2X30   Standing low trap setting off cabinets x10  Lumbar ext 40# 2x10   Ab crunch 40# 2x10   Leg press 2 2x10                 IFC at 80-150 Hz on 40% scan x 15 minutes.  Bilat lumbosacral paraspinals                                                                                                              PATIENT EDUCATION:  Education details:  Person educated:  International aid/development worker:  Education comprehension:   HOME EXERCISE PROGRAM: Access Code: [email or web address] URL: [email or web address] Date: 06/26/2024 Prepared  by: Gellen April Earnie Starring  Exercises - Seated Thoracic Lumbar Extension with Pectoralis Stretch  - 1 x daily - 7 x weekly - 1 sets - 10 reps - Seated Pelvic Tilt  - 1 x daily - 7 x weekly - 1 sets - 10 reps - Seated Figure 4 Piriformis Stretch  - 3 x daily - 7 x weekly - 2 sets - 30 sec hold - Seated Piriformis Stretch  - 3 x daily - 7 x weekly - 2 sets - 30 sec hold  ASSESSMENT:  CLINICAL IMPRESSION: The patient continues to be highly motivated to exercise. He found STW/M effective today.   OBJECTIVE IMPAIRMENTS: Abnormal gait, decreased activity tolerance, decreased ROM, decreased strength, increased muscle spasms, postural dysfunction, and pain.   ACTIVITY LIMITATIONS: carrying, lifting, bending, standing, and locomotion level  PARTICIPATION LIMITATIONS: meal prep, cleaning, laundry, community activity, and yard work  PERSONAL FACTORS: Time since onset of injury/illness/exacerbation and 1-2 comorbidities: prior lumbar surgeries, stroke are also affecting patient's functional outcome.   REHAB POTENTIAL: Good  CLINICAL DECISION MAKING: Stable/uncomplicated  EVALUATION COMPLEXITY: Low   GOALS:   LONG TERM GOALS: Target date: 08/05/24  Ind with a HEP.  Goal status: INITIAL  2.  Walk a community distance with pain not > 3/10.  Goal status: INITIAL  3.  Improve ODI score by at least 6 points.  Goal status: INITIAL  4.  Perform ADL's with pain not > 3/10.  Goal status: INITIAL   PLAN:  PT FREQUENCY: 2x/week  PT DURATION: 6 weeks  PLANNED INTERVENTIONS: 97110-Therapeutic exercises, 97530- Therapeutic activity, [id number]- Neuromuscular re-education, 97535- Self Care, 02859- Manual therapy, G0283- Electrical stimulation (unattended), 97035- Ultrasound, 79439 (1-2 muscles), 20561 (3+ muscles)- Dry Needling, Patient/Family education, and Moist heat.  PLAN FOR NEXT SESSION: Combo e'stim/US , STW/M, core exercise progression.     Jillian Warth, [place], PT 07/18/2024, 2:08 PM

## 2024-07-23 ENCOUNTER — Ambulatory Visit: Admitting: Physical Therapy

## 2024-07-23 DIAGNOSIS — M5459 Other low back pain: Secondary | ICD-10-CM

## 2024-07-23 NOTE — Therapy (Signed)
 OUTPATIENT PHYSICAL THERAPY TREATMENT   Patient Name: Mitchell Fields MRN: [id number] DOB:07-06-1936, 88 y.o., male Today's Date: 07/23/2024  END OF SESSION:  PT End of Session - 07/23/24 1327     Visit Number 9    Number of Visits 12    Date for Recertification  08/05/24    PT Start Time 0100    PT Stop Time 0207    PT Time Calculation (min) 67 min    Activity Tolerance Patient tolerated treatment well    Behavior During Therapy [facility] for tasks assessed/performed            Past Medical History:  Diagnosis Date   Anemia    Arthritis    Back pain    Bursitis of left hip    Coronary artery disease    TWO VESSEL BYPASS SURGERY in January 1995   Diverticulosis    Hyperlipidemia    Hypertension    Monoclonal gammopathy    Neuropathy    Past Surgical History:  Procedure Laterality Date   ADENOIDECTOMY     BACK SURGERY  1985, 1995, 2002   X3   BACK SURGERY  11/2023   COLONOSCOPY     CORONARY ARTERY BYPASS GRAFT     X2. LIMA GRAFT AND A RIMA GRAFT.   INGUINAL HERNIA REPAIR Left    Negative stress echo  08/11/2009   US  ECHOCARDIOGRAPHY  03/2011   Normal EF, mild aortic regurgitation, mild MR, LAE, mildly dilated RV, RAE, increased left atrial pressure, moderate to severe TR and mildly increased  PA systolic pressure.    Patient Active Problem List   Diagnosis Date Noted   Trochanteric bursitis, right hip 08/23/2021   CKD (chronic kidney disease), stage III (HCC) 04/01/2021   Positive anti-CCP test 11/24/2020   Hand deformity 11/24/2020   Pulmonary fibrosis (HCC) 08/24/2020   Diverticulosis of colon 08/24/2020   Melena 08/14/2020   Cough in adult 08/14/2020   Left rotator cuff tear arthropathy 09/16/2019   Other fatigue 03/06/2019   Alcoholic peripheral neuropathy 03/12/2018   Sciatica, right side 10/11/2017   Acute pain of right shoulder 03/02/2017   Impingement syndrome of right shoulder 03/02/2017   Anemia due to chronic illness 03/13/2014   Monoclonal  gammopathy    Lung nodules 03/11/2013   Weight loss, non-intentional 01/28/2013   CAD (coronary artery disease) 08/03/2011   HTN (hypertension) 08/03/2011   Hyperlipidemia 08/03/2011   Valvular heart disease 08/03/2011   REFERRING PROVIDER: Eduard Hanlon  REFERRING DIAG: Spinal stenosis of lumbar region with neurogenic claudication.  Right lumbar radiculopath  Rationale for Evaluation and Treatment: Rehabilitation  THERAPY DIAG:  Other low back pain  ONSET DATE: Ongoing.  Gradual.    SUBJECTIVE:  SUBJECTIVE STATEMENT: New no complaints.  PERTINENT HISTORY:  Stroke, 4 prior lumbar surgeries.    PAIN:  Are you having pain? Yes: NPRS scale: 3/10.   Pain location: Bilateral LB/upper glut. Pain description: Ache and sore. Aggravating factors: As above. Relieving factors: As above.    PRECAUTIONS: Fall.  He is using his cane today and reports no falls with cane.    RED FLAGS: None   WEIGHT BEARING RESTRICTIONS: No  FALLS:  Has patient fallen in last 6 months? Yes. Number of falls He states he fell once after his lumbar surgery in February.  Reports no falls using cane which it is recommended he use at all times.    LIVING ENVIRONMENT: Lives with: lives with their spouse Lives in: House/apartment Stairs: 3 steps to enter. Has following equipment at home: Single point cane  OCCUPATION: Retired   PLOF: Independent with basic ADLs and Independent with household mobility with device  PATIENT GOALS: Decrease back pain.      OBJECTIVE:   PATIENT SURVEYS:  ODI:  25/50.    POSTURE: rounded shoulders, forward head, decreased lumbar lordosis, and increased thoracic kyphosis  PALPATION: CC is bilateral tenderness over bilateral upper gluteal musculature.  His bilateral lumbar  musculature is very taut to palpation.    LUMBAR ROM:  Decreased by 25% and extension to neutral.    LOWER EXTREMITY MMT:   Right dorsiflexion dorsiflexion 1 to 1+/5, knee strength is 4+/5.    GAIT: Steppage gait due to right foot drop using a straight cane.    TREATMENT DATE:   07/23/24:                                     EXERCISE LOG  Exercise Repetitions and Resistance Comments  Nustep Level 3 x 17 minutes   Back ext 50# x 4 minutes   Ab curls 50# x 4 minutes   Rockerboard  5 minutes   Ball press downs 2 x 10    Ball rollouts 2 x 10   Ball squeeze 3 minutes    Scapular retraction Green XTS tubing 2 x 10   HMP and IFC at 80-150 Hz on 40% scan x 15 minutes.     07/18/24:                                     EXERCISE LOG  Exercise Repetitions and Resistance Comments  Nustep Level 2 x 15 minutes (LE's only)   Back ext 50# x 4 minutes   Ab curls 50# x 4 minutes           STW/M x 15 minutes to lumbo-pelvic region f/b HMP and IFC at 80-150 Hz on 40% scan x 15 minutes.    07/16/24:                                     EXERCISE LOG  Exercise Repetitions and Resistance Comments  Nustep Level 3 x 15 minutes   Rockerboard  In parallel bars x 5 minutes   Airex balance pad In parallel bars x 5 minutes   Back ext 50# x 4 minutes    Ab curl machine 50# x 4 minutes    Seated lat PD Blue XTS to fatigue  x 2   Scapular retraction Blue XTS to fatigue x 2   Add squeeze With ball x 3 minutes     IFC at 80-150 Hz on 40% scan x 15 minutes.  Bilat lumbosacral paraspinals    PATIENT EDUCATION:  Education details:  Person educated:  International aid/development worker:  Education comprehension:   HOME EXERCISE PROGRAM: Access Code: [email or web address] URL: [email or web address] Date: 06/26/2024 Prepared by: Gellen April Earnie Starring  Exercises - Seated Thoracic Lumbar Extension with Pectoralis Stretch  - 1 x daily - 7 x weekly - 1 sets - 10 reps - Seated Pelvic Tilt  - 1 x daily - 7  x weekly - 1 sets - 10 reps - Seated Figure 4 Piriformis Stretch  - 3 x daily - 7 x weekly - 2 sets - 30 sec hold - Seated Piriformis Stretch  - 3 x daily - 7 x weekly - 2 sets - 30 sec hold  ASSESSMENT:  CLINICAL IMPRESSION: The patient continues to be highly motivated with therex. He did well with the addition of ball press downs and ball rollouts.  He has two visits remaining.    OBJECTIVE IMPAIRMENTS: Abnormal gait, decreased activity tolerance, decreased ROM, decreased strength, increased muscle spasms, postural dysfunction, and pain.   ACTIVITY LIMITATIONS: carrying, lifting, bending, standing, and locomotion level  PARTICIPATION LIMITATIONS: meal prep, cleaning, laundry, community activity, and yard work  PERSONAL FACTORS: Time since onset of injury/illness/exacerbation and 1-2 comorbidities: prior lumbar surgeries, stroke are also affecting patient's functional outcome.   REHAB POTENTIAL: Good  CLINICAL DECISION MAKING: Stable/uncomplicated  EVALUATION COMPLEXITY: Low   GOALS:   LONG TERM GOALS: Target date: 08/05/24  Ind with a HEP.  Goal status: MET.  2.  Walk a community distance with pain not > 3/10.  Goal status: INITIAL  3.  Improve ODI score by at least 6 points.  Goal status: INITIAL  4.  Perform ADL's with pain not > 3/10.  Goal status: INITIAL   PLAN:  PT FREQUENCY: 2x/week  PT DURATION: 6 weeks  PLANNED INTERVENTIONS: 97110-Therapeutic exercises, 97530- Therapeutic activity, [id number]- Neuromuscular re-education, 97535- Self Care, 02859- Manual therapy, G0283- Electrical stimulation (unattended), 97035- Ultrasound, 79439 (1-2 muscles), 20561 (3+ muscles)- Dry Needling, Patient/Family education, and Moist heat.  PLAN FOR NEXT SESSION: Combo e'stim/US , STW/M, core exercise progression.     Jocelyn Lowery, [place], PT 07/23/2024, 2:25 PM

## 2024-07-25 ENCOUNTER — Ambulatory Visit: Attending: Cardiology | Admitting: Cardiology

## 2024-07-25 ENCOUNTER — Encounter: Payer: Self-pay | Admitting: Cardiology

## 2024-07-25 ENCOUNTER — Ambulatory Visit: Admitting: Physical Therapy

## 2024-07-25 VITALS — BP 146/64 | HR 45 | Ht 69.0 in | Wt 134.8 lb

## 2024-07-25 DIAGNOSIS — M62830 Muscle spasm of back: Secondary | ICD-10-CM

## 2024-07-25 DIAGNOSIS — I25810 Atherosclerosis of coronary artery bypass graft(s) without angina pectoris: Secondary | ICD-10-CM | POA: Diagnosis not present

## 2024-07-25 DIAGNOSIS — I639 Cerebral infarction, unspecified: Secondary | ICD-10-CM

## 2024-07-25 DIAGNOSIS — I442 Atrioventricular block, complete: Secondary | ICD-10-CM | POA: Diagnosis not present

## 2024-07-25 DIAGNOSIS — I10 Essential (primary) hypertension: Secondary | ICD-10-CM

## 2024-07-25 DIAGNOSIS — R293 Abnormal posture: Secondary | ICD-10-CM

## 2024-07-25 DIAGNOSIS — I2510 Atherosclerotic heart disease of native coronary artery without angina pectoris: Secondary | ICD-10-CM

## 2024-07-25 DIAGNOSIS — M5459 Other low back pain: Secondary | ICD-10-CM

## 2024-07-25 DIAGNOSIS — Z01812 Encounter for preprocedural laboratory examination: Secondary | ICD-10-CM

## 2024-07-25 DIAGNOSIS — M6281 Muscle weakness (generalized): Secondary | ICD-10-CM

## 2024-07-25 NOTE — Therapy (Signed)
 OUTPATIENT PHYSICAL THERAPY TREATMENT AND 10TH VISIT PROGRESS NOTE  Progress Note Reporting Period 06/24/24 to 07/25/2024  See note below for Objective Data and Assessment of Progress/Goals.     Patient Name: Mitchell Fields MRN: [id number] DOB:Jan 09, 1936, 88 y.o., male Today's Date: 07/25/2024  END OF SESSION:  PT End of Session - 07/25/24 1018     Visit Number 10    Number of Visits 12    Date for Recertification  08/05/24    PT Start Time 1015    PT Stop Time 1055    PT Time Calculation (min) 40 min    Activity Tolerance Patient tolerated treatment well    Behavior During Therapy [facility] for tasks assessed/performed           Past Medical History:  Diagnosis Date   Anemia    Arthritis    Back pain    Bursitis of left hip    Coronary artery disease    TWO VESSEL BYPASS SURGERY in January 1995   Diverticulosis    Hyperlipidemia    Hypertension    Monoclonal gammopathy    Neuropathy    Past Surgical History:  Procedure Laterality Date   ADENOIDECTOMY     BACK SURGERY  1985, 1995, 2002   X3   BACK SURGERY  11/2023   COLONOSCOPY     CORONARY ARTERY BYPASS GRAFT     X2. LIMA GRAFT AND A RIMA GRAFT.   INGUINAL HERNIA REPAIR Left    Negative stress echo  08/11/2009   US  ECHOCARDIOGRAPHY  03/2011   Normal EF, mild aortic regurgitation, mild MR, LAE, mildly dilated RV, RAE, increased left atrial pressure, moderate to severe TR and mildly increased  PA systolic pressure.    Patient Active Problem List   Diagnosis Date Noted   Trochanteric bursitis, right hip 08/23/2021   CKD (chronic kidney disease), stage III (HCC) 04/01/2021   Positive anti-CCP test 11/24/2020   Hand deformity 11/24/2020   Pulmonary fibrosis (HCC) 08/24/2020   Diverticulosis of colon 08/24/2020   Melena 08/14/2020   Cough in adult 08/14/2020   Left rotator cuff tear arthropathy 09/16/2019   Other fatigue 03/06/2019   Alcoholic peripheral neuropathy 03/12/2018   Sciatica, right side  10/11/2017   Acute pain of right shoulder 03/02/2017   Impingement syndrome of right shoulder 03/02/2017   Anemia due to chronic illness 03/13/2014   Monoclonal gammopathy    Lung nodules 03/11/2013   Weight loss, non-intentional 01/28/2013   CAD (coronary artery disease) 08/03/2011   HTN (hypertension) 08/03/2011   Hyperlipidemia 08/03/2011   Valvular heart disease 08/03/2011   REFERRING PROVIDER: Eduard Hanlon  REFERRING DIAG: Spinal stenosis of lumbar region with neurogenic claudication.  Right lumbar radiculopathy  Rationale for Evaluation and Treatment: Rehabilitation  THERAPY DIAG:  No diagnosis found.  ONSET DATE: Ongoing.  Gradual.    SUBJECTIVE:  SUBJECTIVE STATEMENT: New no complaints. Pain still gets up to 6 or 7 with walking.   PERTINENT HISTORY:  Stroke, 4 prior lumbar surgeries.    PAIN:  Are you having pain? Yes: NPRS scale: 3 or 4/10.   Pain location: Bilateral LB/upper glut. Pain description: Ache and sore. Aggravating factors: As above. Relieving factors: As above.    PRECAUTIONS: Fall.  He is using his cane today and reports no falls with cane.    RED FLAGS: None   WEIGHT BEARING RESTRICTIONS: No  FALLS:  Has patient fallen in last 6 months? Yes. Number of falls He states he fell once after his lumbar surgery in February.  Reports no falls using cane which it is recommended he use at all times.    LIVING ENVIRONMENT: Lives with: lives with their spouse Lives in: House/apartment Stairs: 3 steps to enter. Has following equipment at home: Single point cane  OCCUPATION: Retired   PLOF: Independent with basic ADLs and Independent with household mobility with device  PATIENT GOALS: Decrease back pain.      OBJECTIVE:   PATIENT SURVEYS:  ODI:  25/50.     POSTURE: rounded shoulders, forward head, decreased lumbar lordosis, and increased thoracic kyphosis  PALPATION: CC is bilateral tenderness over bilateral upper gluteal musculature.  His bilateral lumbar musculature is very taut to palpation.    LUMBAR ROM:  Decreased by 25% and extension to neutral.    LOWER EXTREMITY MMT:   Right dorsiflexion dorsiflexion 1 to 1+/5, knee strength is 4+/5.    GAIT: Steppage gait due to right foot drop using a straight cane.    TREATMENT DATE:  07/25/24:                                   EXERCISE LOG  Exercise Repetitions and Resistance Comments  Nustep Level 3 x 15 minutes   Counter cat/cow x10   Modified quadruped on counter hip ext 2x10   Standing    Seated lumbar ext into towel x10   Seated ant/post pelvic tilt  2x10   Seated clamshell red TB 2 x 10    Seated ball squeeze 2 x 10   Seated figure 4 stretch 2X30       HMP and IFC at 80-150 Hz on 40% scan x 15 minutes.     07/23/24:                                   EXERCISE LOG  Exercise Repetitions and Resistance Comments  Nustep Level 3 x 17 minutes   Back ext 50# x 4 minutes   Ab curls 50# x 4 minutes   Rockerboard  5 minutes   Ball press downs 2 x 10    Ball rollouts 2 x 10   Ball squeeze 3 minutes    Scapular retraction Green XTS tubing 2 x 10   HMP and IFC at 80-150 Hz on 40% scan x 15 minutes.       PATIENT EDUCATION:  Education details:  Person educated:  International aid/development worker:  Education comprehension:   HOME EXERCISE PROGRAM: Access Code: [email or web address] URL: [email or web address] Date: 06/26/2024 Prepared by: Nakaila Freeze April Earnie Starring  Exercises - Seated Thoracic Lumbar Extension with Pectoralis Stretch  - 1 x daily - 7 x weekly - 1 sets - 10 reps - Seated Pelvic Tilt  -  1 x daily - 7 x weekly - 1 sets - 10 reps - Seated Figure 4 Piriformis Stretch  - 3 x daily - 7 x weekly - 2 sets - 30 sec hold - Seated Piriformis Stretch  - 3 x daily - 7 x weekly  - 2 sets - 30 sec hold  ASSESSMENT:  CLINICAL IMPRESSION: Treatment focused more on pelvic mobility and hip strengthening. Pt's pain is more centered towards his bilat SI. Hips remain very hypomobile and inflexible (R tighter than L). Pt remains highly active and does exercise classes 2 days/wk for ~1 hour in addition to his PT sessions. Pt's goals remain ongoing -- pain has still not reduced with amb/activities.   OBJECTIVE IMPAIRMENTS: Abnormal gait, decreased activity tolerance, decreased ROM, decreased strength, increased muscle spasms, postural dysfunction, and pain.   ACTIVITY LIMITATIONS: carrying, lifting, bending, standing, and locomotion level  PARTICIPATION LIMITATIONS: meal prep, cleaning, laundry, community activity, and yard work  PERSONAL FACTORS: Time since onset of injury/illness/exacerbation and 1-2 comorbidities: prior lumbar surgeries, stroke are also affecting patient's functional outcome.   REHAB POTENTIAL: Good  CLINICAL DECISION MAKING: Stable/uncomplicated  EVALUATION COMPLEXITY: Low   GOALS:   LONG TERM GOALS: Target date: 08/05/24  Ind with a HEP.  Goal status: MET.  2.  Walk a community distance with pain not > 3/10.  Goal status: IN PROGRESS  3.  Improve ODI score by at least 6 points.  Goal status: IN PROGRESS  4.  Perform ADL's with pain not > 3/10.  Goal status: IN PROGRESS   PLAN:  PT FREQUENCY: 2x/week  PT DURATION: 6 weeks  PLANNED INTERVENTIONS: 97110-Therapeutic exercises, 97530- Therapeutic activity, [id number]- Neuromuscular re-education, 97535- Self Care, 02859- Manual therapy, G0283- Electrical stimulation (unattended), 97035- Ultrasound, 79439 (1-2 muscles), 20561 (3+ muscles)- Dry Needling, Patient/Family education, and Moist heat.  PLAN FOR NEXT SESSION: Combo e'stim/US , STW/M, core exercise progression.  Hip extension/abduction. Hip stretching for SI   Ociel Retherford April Ma L Fairview, PT, DPT 07/25/2024, 10:18 AM

## 2024-07-25 NOTE — H&P (View-Only) (Signed)
  Electrophysiology Office Note:   Date:  07/25/2024  ID:  Jaques Mineer, DOB [birth year], MRN [id number]  Primary Cardiologist: Peter Swaziland, MD Primary Heart Failure: None Electrophysiologist: None      History of Present Illness:   Mitchell Fields is a 88 y.o. male with h/o coronary artery disease post CABG, hypertension, hyperlipidemia, monoclonal gammopathy, IPF, anemia of chronic disease seen today for  for Electrophysiology evaluation of bradycardia at the request of [facility].    On 04/07/2024, he had an episode where he was going to church.  All of a sudden felt confused and not well.  His wife is a Engineer, civil (consulting) and did not feel like he had any signs or symptoms of stroke.  He laid down and went to sleep.  He followed up with his PCP 04/09/2024 for the evaluation of lightheadedness and fatigue.  MRI of the brain showed subacute to acute infarctions of the left frontal and parietal watershed areas.  He underwent echo that showed a normal ejection fraction with a negative bubble study.  He wore a cardiac monitor that showed episodes of 2-1 AV block and bradycardia with heart rates in the 30s.  He had complete heart block which was overnight.  Currently he feels well.  He has no chest pain or shortness of breath.  His activity is limited by his stroke.  He is not limited by fatigue, weakness.  Review of systems complete and found to be negative unless listed in HPI.   EP Information / Studies Reviewed:    EKG is ordered today. Personal review as below.        Risk Assessment/Calculations:            Physical Exam:   VS:  BP (!) 146/64   Pulse (!) 45   Ht 5' 9 (1.753 m)   Wt 134 lb 12.8 oz (61.1 kg)   SpO2 96%   BMI 19.91 kg/m    Wt Readings from Last 3 Encounters:  07/25/24 134 lb 12.8 oz (61.1 kg)  06/14/24 136 lb 12.8 oz (62.1 kg)  06/11/24 135 lb (61.2 kg)     GEN: Well nourished, well developed in no acute distress NECK: No JVD; No carotid bruits CARDIAC:  Bradycardic, regular, no murmurs, rubs, gallops RESPIRATORY:  Clear to auscultation without rales, wheezing or rhonchi  ABDOMEN: Soft, non-tender, non-distended EXTREMITIES:  No edema; No deformity   ASSESSMENT AND PLAN:    1.  Complete heart block: Patient has complete heart block on his EKG today.  Based on that, he would likely benefit from pacemaker implant.  Can monitor for atrial fibrillation due to CVA on his pacemaker.  Risks and benefits of pacemaker implant were discussed.  He understands the risks and has agreed to the procedure.  Explained risks, benefits, and alternatives to PPM implantation, including but not limited to bleeding, infection, pneumothorax, pericardial effusion, lead dislodgement, heart attack, stroke, or death.  Pt verbalized understanding and agrees to proceed.  2.  CVA: Likely occurred in July 2025.  Managed by neurology.  3.  Coronary disease: Post CABG.  No current chest pain.  4.  Hypertension: Mildly elevated today.  Plan per primary cardiology.  5.  Chronic diastolic heart failure: No obvious volume overload.  Plan per primary cardiology  Follow up with EP Team as usual post procedure  Signed, Talley Kreiser Gladis Norton, MD

## 2024-07-25 NOTE — Patient Instructions (Addendum)
 Medication Instructions:  Your physician recommends that you continue on your current medications as directed. Please refer to the Current Medication list given to you today.  *If you need a refill on your cardiac medications before your next appointment, please call your pharmacy*  Lab Work: None ordered  If you have any lab test that is abnormal or we need to change your treatment, we will call you to review the results.  Testing/Procedures: Your physician has recommended that you have a pacemaker inserted. A pacemaker is a small device that is placed under the skin of your chest or abdomen to help control abnormal heart rhythms. This device uses electrical pulses to prompt the heart to beat at a normal rate. Pacemakers are used to treat heart rhythms that are too slow. Wire (leads) are attached to the pacemaker that goes into the chambers of you heart. This is done in the hospital and usually requires and overnight stay.       You will be scheduled for 09/05/24, arrive to [facility] at 11:30 am  Follow-Up: At [facility], you and your health needs are our priority.  As part of our continuing mission to provide you with exceptional heart care, our providers are all part of one team.  This team includes your primary Cardiologist (physician) and Advanced Practice Providers or APPs (Physician Assistants and Nurse Practitioners) who all work together to provide you with the care you need, when you need it.  Your next appointment:   2 week(s) after your pacemaker implant  Provider:   Device clinic for a wound check      Thank you for choosing [facility]!!   Maeola Domino, RN [phone]   Other Instructions  Pacemaker Implantation, Adult Pacemaker implantation is a procedure to put a pacemaker in the chest. A pacemaker is a small computer that sends electrical signals to the heart. This helps the heart beat normally. Some pacemakers keep information about heart rhythms.  You may need this procedure if you have: A slow heartbeat (bradycardia). Repeated fainting (syncope), or you often have dizziness or light-headedness because of an irregular heart rate. A weak heart that is not pumping enough blood to your body. The pacemaker can help your heart chambers beat in sync. The pacemaker attaches to your heart with a wire called a lead. One or two leads may be needed. There are different types of pacemakers: Transvenous pacemaker. This type is placed under the skin or muscle of your upper chest. The lead goes through a vein in the chest to the inside of the heart. Epicardial pacemaker. This type is placed under the skin or muscle of your chest or belly. The lead goes through your chest to the outside of the heart. Tell a health care provider about: Any allergies you have. All medicines you are taking, including vitamins, herbs, eye drops, creams, and over-the-counter medicines. Any problems you or family members have had with anesthesia. Any bleeding problems or bone disorders you have. Any surgeries you have had. Any medical conditions you have. Whether you are pregnant or may be pregnant. What are the risks? Your health care provider will talk with you about risks. These may include: Infection. Bleeding. Failure of the pacemaker or lead. A collapsed lung or bleeding into a lung. A blood clot inside a blood vessel with a lead. Heart damage. Infection inside the heart (endocarditis). Allergic reactions to medicines. What happens before the procedure? When to stop eating and drinking Follow instructions from  your provider about what you may eat and drink. These may include: 8 hours before your procedure Stop eating most foods. Do not eat meat, fried foods, or fatty foods. Eat only light foods, such as toast or crackers. All liquids are okay except energy drinks and alcohol. 6 hours before your procedure Stop eating. Drink only clear liquids, such as water,  clear fruit juice, black coffee, plain tea, and sports drinks. Do not drink energy drinks or alcohol. 2 hours before your procedure Stop drinking all liquids. You may be allowed to take medicines with small sips of water. If you do not follow your provider's instructions, your procedure may be delayed or canceled. Medicines Ask your provider about: Changing or stopping your regular medicines. These include any diabetes medicines or blood thinners you take. Taking medicines such as aspirin and ibuprofen. These medicines can thin your blood. Do not take them unless your provider tells you to. Taking over-the-counter medicines, vitamins, herbs, and supplements. Tests You may have: A heart test. This may include: An electrocardiogram (ECG). Patches will be put on your skin to check your heart rhythm. A chest X-ray. An echocardiogram. Sound waves (ultrasound) are used to get an image of the heart. A cardiac rhythm monitor. This records your heart rhythm and any events for a longer period of time. Blood tests. Genetic testing. General instructions Do not use any products that contain nicotine or tobacco. These products include cigarettes, chewing tobacco, and vaping devices, such as e-cigarettes. If you need help quitting, ask your provider. Ask your provider: How your surgery site will be marked. What steps will be taken to help prevent infection. These steps may include: Removing hair at the surgery site. Washing skin with a soap that kills germs. Receiving antibiotics. If you will be going home right after the procedure, plan to have a responsible adult: Take you home from the hospital or clinic. You will not be allowed to drive. Care for you for the time you are told. What happens during the procedure? An IV will be inserted into one of your veins. You may be given: A sedative. This helps you relax. Anesthesia. This keeps you from feeling pain. It will make you fall asleep for  surgery. The next steps vary depending on which pacemaker you will be getting. If you are getting a transvenous pacemaker: An incision will be made in your upper chest. A pocket will be made for the pacemaker. It may be placed under the skin or between layers of muscle. The lead will be put into a blood vessel that goes to the heart. While X-rays are taken by an imaging machine (fluoroscopy), the lead will be moved through the vein to the inside of your heart. The other end of the lead will be put under the skin and attached to the pacemaker. If you are getting an epicardial pacemaker: An incision will be made near your ribs or breastbone (sternum) for the lead. The lead will be attached to the outside of your heart. Another incision will be made in your chest or upper belly to make a pocket for the pacemaker. The free end of the lead will be moved under the skin and attached to the pacemaker. The pacemaker will be tested. Pictures may be taken to check the lead position. The incisions will be closed with stitches (sutures), tape strips, or skin glue. Bandages (dressings) will be placed over the incisions. The procedure may vary among providers and hospitals. What happens after the procedure?  Your blood pressure, heart rate, breathing rate, and blood oxygen level will be monitored until you leave the hospital or clinic. You may be given antibiotics. You will be given pain medicine. An ECG and chest X-rays will be done. Your provider will program the pacemaker. If you were given a sedative during the procedure, it can affect you for several hours. Do not drive or operate machinery until your provider says that it is safe. You will be given a pacemaker identification card. This card lists the implant date, device model, and manufacturer of your pacemaker. This information is not intended to replace advice given to you by your health care provider. Make sure you discuss any questions you have  with your health care provider. Document Revised: 07/29/2022 Document Reviewed: 07/29/2022 Elsevier Patient Education  2024 [employer].

## 2024-07-25 NOTE — Progress Notes (Signed)
  Electrophysiology Office Note:   Date:  07/25/2024  ID:  Jaques Mineer, DOB [birth year], MRN [id number]  Primary Cardiologist: Peter Swaziland, MD Primary Heart Failure: None Electrophysiologist: None      History of Present Illness:   Cranston Koors is a 88 y.o. male with h/o coronary artery disease post CABG, hypertension, hyperlipidemia, monoclonal gammopathy, IPF, anemia of chronic disease seen today for  for Electrophysiology evaluation of bradycardia at the request of [facility].    On 04/07/2024, he had an episode where he was going to church.  All of a sudden felt confused and not well.  His wife is a Engineer, civil (consulting) and did not feel like he had any signs or symptoms of stroke.  He laid down and went to sleep.  He followed up with his PCP 04/09/2024 for the evaluation of lightheadedness and fatigue.  MRI of the brain showed subacute to acute infarctions of the left frontal and parietal watershed areas.  He underwent echo that showed a normal ejection fraction with a negative bubble study.  He wore a cardiac monitor that showed episodes of 2-1 AV block and bradycardia with heart rates in the 30s.  He had complete heart block which was overnight.  Currently he feels well.  He has no chest pain or shortness of breath.  His activity is limited by his stroke.  He is not limited by fatigue, weakness.  Review of systems complete and found to be negative unless listed in HPI.   EP Information / Studies Reviewed:    EKG is ordered today. Personal review as below.        Risk Assessment/Calculations:            Physical Exam:   VS:  BP (!) 146/64   Pulse (!) 45   Ht 5' 9 (1.753 m)   Wt 134 lb 12.8 oz (61.1 kg)   SpO2 96%   BMI 19.91 kg/m    Wt Readings from Last 3 Encounters:  07/25/24 134 lb 12.8 oz (61.1 kg)  06/14/24 136 lb 12.8 oz (62.1 kg)  06/11/24 135 lb (61.2 kg)     GEN: Well nourished, well developed in no acute distress NECK: No JVD; No carotid bruits CARDIAC:  Bradycardic, regular, no murmurs, rubs, gallops RESPIRATORY:  Clear to auscultation without rales, wheezing or rhonchi  ABDOMEN: Soft, non-tender, non-distended EXTREMITIES:  No edema; No deformity   ASSESSMENT AND PLAN:    1.  Complete heart block: Patient has complete heart block on his EKG today.  Based on that, he would likely benefit from pacemaker implant.  Can monitor for atrial fibrillation due to CVA on his pacemaker.  Risks and benefits of pacemaker implant were discussed.  He understands the risks and has agreed to the procedure.  Explained risks, benefits, and alternatives to PPM implantation, including but not limited to bleeding, infection, pneumothorax, pericardial effusion, lead dislodgement, heart attack, stroke, or death.  Pt verbalized understanding and agrees to proceed.  2.  CVA: Likely occurred in July 2025.  Managed by neurology.  3.  Coronary disease: Post CABG.  No current chest pain.  4.  Hypertension: Mildly elevated today.  Plan per primary cardiology.  5.  Chronic diastolic heart failure: No obvious volume overload.  Plan per primary cardiology  Follow up with EP Team as usual post procedure  Signed, Talley Kreiser Gladis Norton, MD

## 2024-07-26 ENCOUNTER — Other Ambulatory Visit: Payer: Self-pay | Admitting: *Deleted

## 2024-07-26 DIAGNOSIS — I442 Atrioventricular block, complete: Secondary | ICD-10-CM

## 2024-07-26 DIAGNOSIS — Z01812 Encounter for preprocedural laboratory examination: Secondary | ICD-10-CM

## 2024-07-26 NOTE — Addendum Note (Signed)
 Addended by: GRETEL MAEOLA CROME on: 07/26/2024 09:18 AM   Modules accepted: Orders

## 2024-07-30 ENCOUNTER — Ambulatory Visit: Admitting: Physical Therapy

## 2024-07-30 DIAGNOSIS — M5459 Other low back pain: Secondary | ICD-10-CM

## 2024-07-30 DIAGNOSIS — R293 Abnormal posture: Secondary | ICD-10-CM

## 2024-07-30 DIAGNOSIS — M62830 Muscle spasm of back: Secondary | ICD-10-CM

## 2024-07-30 NOTE — Therapy (Signed)
 OUTPATIENT PHYSICAL THERAPY TREATMENT AND 10TH VISIT PROGRESS NOTE  Progress Note Reporting Period 06/24/24 to 07/30/2024  See note below for Objective Data and Assessment of Progress/Goals.     Patient Name: Oziah Vitanza MRN: [id number] DOB:10/21/1935, 88 y.o., male Today's Date: 07/30/2024  END OF SESSION:  PT End of Session - 07/30/24 1302     Visit Number 11    Number of Visits 12    Date for Recertification  08/05/24    PT Start Time 1258    PT Stop Time 1400    PT Time Calculation (min) 62 min    Activity Tolerance Patient tolerated treatment well    Behavior During Therapy WFL for tasks assessed/performed           Past Medical History:  Diagnosis Date   Anemia    Arthritis    Back pain    Bursitis of left hip    Coronary artery disease    TWO VESSEL BYPASS SURGERY in January 1995   Diverticulosis    Hyperlipidemia    Hypertension    Monoclonal gammopathy    Neuropathy    Past Surgical History:  Procedure Laterality Date   ADENOIDECTOMY     BACK SURGERY  1985, 1995, 2002   X3   BACK SURGERY  11/2023   COLONOSCOPY     CORONARY ARTERY BYPASS GRAFT     X2. LIMA GRAFT AND A RIMA GRAFT.   INGUINAL HERNIA REPAIR Left    Negative stress echo  08/11/2009   US  ECHOCARDIOGRAPHY  03/2011   Normal EF, mild aortic regurgitation, mild MR, LAE, mildly dilated RV, RAE, increased left atrial pressure, moderate to severe TR and mildly increased  PA systolic pressure.    Patient Active Problem List   Diagnosis Date Noted   Trochanteric bursitis, right hip 08/23/2021   CKD (chronic kidney disease), stage III (HCC) 04/01/2021   Positive anti-CCP test 11/24/2020   Hand deformity 11/24/2020   Pulmonary fibrosis (HCC) 08/24/2020   Diverticulosis of colon 08/24/2020   Melena 08/14/2020   Cough in adult 08/14/2020   Left rotator cuff tear arthropathy 09/16/2019   Other fatigue 03/06/2019   Alcoholic peripheral neuropathy 03/12/2018   Sciatica, right side  10/11/2017   Acute pain of right shoulder 03/02/2017   Impingement syndrome of right shoulder 03/02/2017   Anemia due to chronic illness 03/13/2014   Monoclonal gammopathy    Lung nodules 03/11/2013   Weight loss, non-intentional 01/28/2013   CAD (coronary artery disease) 08/03/2011   HTN (hypertension) 08/03/2011   Hyperlipidemia 08/03/2011   Valvular heart disease 08/03/2011   REFERRING PROVIDER: Eduard Hanlon  REFERRING DIAG: Spinal stenosis of lumbar region with neurogenic claudication.  Right lumbar radiculopathy  Rationale for Evaluation and Treatment: Rehabilitation  THERAPY DIAG:  Other low back pain  Abnormal posture  Muscle spasm of back  ONSET DATE: Ongoing.  Gradual.    SUBJECTIVE:  SUBJECTIVE STATEMENT: About the same.    PERTINENT HISTORY:  Stroke, 4 prior lumbar surgeries.    PAIN:  Are you having pain? Yes: NPRS scale: 3 or 4/10.   Pain location: Bilateral LB/upper glut. Pain description: Ache and sore. Aggravating factors: As above. Relieving factors: As above.    PRECAUTIONS: Fall.  He is using his cane today and reports no falls with cane.    RED FLAGS: None   WEIGHT BEARING RESTRICTIONS: No  FALLS:  Has patient fallen in last 6 months? Yes. Number of falls He states he fell once after his lumbar surgery in February.  Reports no falls using cane which it is recommended he use at all times.    LIVING ENVIRONMENT: Lives with: lives with their spouse Lives in: House/apartment Stairs: 3 steps to enter. Has following equipment at home: Single point cane  OCCUPATION: Retired   PLOF: Independent with basic ADLs and Independent with household mobility with device  PATIENT GOALS: Decrease back pain.      OBJECTIVE:   PATIENT SURVEYS:  ODI:  25/50.     POSTURE: rounded shoulders, forward head, decreased lumbar lordosis, and increased thoracic kyphosis  PALPATION: CC is bilateral tenderness over bilateral upper gluteal musculature.  His bilateral lumbar musculature is very taut to palpation.    LUMBAR ROM:  Decreased by 25% and extension to neutral.    LOWER EXTREMITY MMT:   Right dorsiflexion dorsiflexion 1 to 1+/5, knee strength is 4+/5.    GAIT: Steppage gait due to right foot drop using a straight cane.    TREATMENT DATE:   07/31/24:                                     EXERCISE LOG  Exercise Repetitions and Resistance Comments  Nustep Level 3 x 15 minutes    Back ext 50# x 5 minutes   Ab curls 50# x 5 minutes            STW/M x 13 minutes f/b HMP and IFC at 80-150 Hz on 40% scan x 20 minutes.  Normal modality resposne following removal of modality.  07/25/24:                                   EXERCISE LOG  Exercise Repetitions and Resistance Comments  Nustep Level 3 x 15 minutes   Counter cat/cow x10   Modified quadruped on counter hip ext 2x10   Standing    Seated lumbar ext into towel x10   Seated ant/post pelvic tilt  2x10   Seated clamshell red TB 2 x 10    Seated ball squeeze 2 x 10   Seated figure 4 stretch 2X30       HMP and IFC at 80-150 Hz on 40% scan x 15 minutes.     07/23/24:                                   EXERCISE LOG  Exercise Repetitions and Resistance Comments  Nustep Level 3 x 17 minutes   Back ext 50# x 4 minutes   Ab curls 50# x 4 minutes   Rockerboard  5 minutes   Ball press downs 2 x 10    Ball rollouts 2 x 10  Ball squeeze 3 minutes    Scapular retraction Green XTS tubing 2 x 10   HMP and IFC at 80-150 Hz on 40% scan x 15 minutes.       PATIENT EDUCATION:  Education details:  Person educated:  International aid/development worker:  Education comprehension:   HOME EXERCISE PROGRAM: Access Code: [email or web address] URL: [email or web address] Date: 06/26/2024 Prepared by: Gellen  April Earnie Starring  Exercises - Seated Thoracic Lumbar Extension with Pectoralis Stretch  - 1 x daily - 7 x weekly - 1 sets - 10 reps - Seated Pelvic Tilt  - 1 x daily - 7 x weekly - 1 sets - 10 reps - Seated Figure 4 Piriformis Stretch  - 3 x daily - 7 x weekly - 2 sets - 30 sec hold - Seated Piriformis Stretch  - 3 x daily - 7 x weekly - 2 sets - 30 sec hold  ASSESSMENT:  CLINICAL IMPRESSION: Patient did well with his session today and tolerated without complaint.  He was palpably tender over his left lumbar musculature and did well with soft tissue work.  OBJECTIVE IMPAIRMENTS: Abnormal gait, decreased activity tolerance, decreased ROM, decreased strength, increased muscle spasms, postural dysfunction, and pain.   ACTIVITY LIMITATIONS: carrying, lifting, bending, standing, and locomotion level  PARTICIPATION LIMITATIONS: meal prep, cleaning, laundry, community activity, and yard work  PERSONAL FACTORS: Time since onset of injury/illness/exacerbation and 1-2 comorbidities: prior lumbar surgeries, stroke are also affecting patient's functional outcome.   REHAB POTENTIAL: Good  CLINICAL DECISION MAKING: Stable/uncomplicated  EVALUATION COMPLEXITY: Low   GOALS:   LONG TERM GOALS: Target date: 08/05/24  Ind with a HEP.  Goal status: MET.  2.  Walk a community distance with pain not > 3/10.  Goal status: IN PROGRESS  3.  Improve ODI score by at least 6 points.  Goal status: IN PROGRESS  4.  Perform ADL's with pain not > 3/10.  Goal status: IN PROGRESS   PLAN:  PT FREQUENCY: 2x/week  PT DURATION: 6 weeks  PLANNED INTERVENTIONS: 97110-Therapeutic exercises, 97530- Therapeutic activity, [id number]- Neuromuscular re-education, 97535- Self Care, 02859- Manual therapy, G0283- Electrical stimulation (unattended), 97035- Ultrasound, 79439 (1-2 muscles), 20561 (3+ muscles)- Dry Needling, Patient/Family education, and Moist heat.  PLAN FOR NEXT SESSION: Combo e'stim/US , STW/M, core  exercise progression.  Hip extension/abduction. Hip stretching for SI   Rylan Kaufmann, PT, DPT 07/30/2024, 2:12 PM

## 2024-08-01 ENCOUNTER — Ambulatory Visit: Admitting: Physical Therapy

## 2024-08-01 ENCOUNTER — Encounter: Payer: Self-pay | Admitting: Physical Therapy

## 2024-08-01 DIAGNOSIS — M5459 Other low back pain: Secondary | ICD-10-CM | POA: Diagnosis not present

## 2024-08-01 DIAGNOSIS — M62830 Muscle spasm of back: Secondary | ICD-10-CM

## 2024-08-01 DIAGNOSIS — M6281 Muscle weakness (generalized): Secondary | ICD-10-CM

## 2024-08-01 DIAGNOSIS — R293 Abnormal posture: Secondary | ICD-10-CM

## 2024-08-01 DIAGNOSIS — R2681 Unsteadiness on feet: Secondary | ICD-10-CM

## 2024-08-01 DIAGNOSIS — R2689 Other abnormalities of gait and mobility: Secondary | ICD-10-CM

## 2024-08-01 NOTE — Therapy (Signed)
 OUTPATIENT PHYSICAL THERAPY TREATMENT AND DISCHARGE  PHYSICAL THERAPY DISCHARGE SUMMARY  Visits from Start of Care: 12  Current functional level related to goals / functional outcomes: See below   Remaining deficits: See below   Education / Equipment: See below   Patient agrees to discharge. Patient goals were partially met. Patient is being discharged due to maximized rehab potential.      Patient Name: Mitchell Fields MRN: [id number] DOB:Feb 15, 1936, 88 y.o., male Today's Date: 08/01/2024  END OF SESSION:  PT End of Session - 08/01/24 1255     Visit Number 12    Number of Visits 12    Date for Recertification  08/05/24    PT Start Time 1300    PT Stop Time 1340    PT Time Calculation (min) 40 min    Activity Tolerance Patient tolerated treatment well    Behavior During Therapy [facility] for tasks assessed/performed            Past Medical History:  Diagnosis Date   Anemia    Arthritis    Back pain    Bursitis of left hip    Coronary artery disease    TWO VESSEL BYPASS SURGERY in January 1995   Diverticulosis    Hyperlipidemia    Hypertension    Monoclonal gammopathy    Neuropathy    Past Surgical History:  Procedure Laterality Date   ADENOIDECTOMY     BACK SURGERY  1985, 1995, 2002   X3   BACK SURGERY  11/2023   COLONOSCOPY     CORONARY ARTERY BYPASS GRAFT     X2. LIMA GRAFT AND A RIMA GRAFT.   INGUINAL HERNIA REPAIR Left    Negative stress echo  08/11/2009   US  ECHOCARDIOGRAPHY  03/2011   Normal EF, mild aortic regurgitation, mild MR, LAE, mildly dilated RV, RAE, increased left atrial pressure, moderate to severe TR and mildly increased  PA systolic pressure.    Patient Active Problem List   Diagnosis Date Noted   Trochanteric bursitis, right hip 08/23/2021   CKD (chronic kidney disease), stage III (HCC) 04/01/2021   Positive anti-CCP test 11/24/2020   Hand deformity 11/24/2020   Pulmonary fibrosis (HCC) 08/24/2020   Diverticulosis of colon  08/24/2020   Melena 08/14/2020   Cough in adult 08/14/2020   Left rotator cuff tear arthropathy 09/16/2019   Other fatigue 03/06/2019   Alcoholic peripheral neuropathy 03/12/2018   Sciatica, right side 10/11/2017   Acute pain of right shoulder 03/02/2017   Impingement syndrome of right shoulder 03/02/2017   Anemia due to chronic illness 03/13/2014   Monoclonal gammopathy    Lung nodules 03/11/2013   Weight loss, non-intentional 01/28/2013   CAD (coronary artery disease) 08/03/2011   HTN (hypertension) 08/03/2011   Hyperlipidemia 08/03/2011   Valvular heart disease 08/03/2011   REFERRING PROVIDER: Eduard Hanlon  REFERRING DIAG: Spinal stenosis of lumbar region with neurogenic claudication.  Right lumbar radiculopathy  Rationale for Evaluation and Treatment: Rehabilitation  THERAPY DIAG:  Other low back pain  Abnormal posture  Muscle spasm of back  Muscle weakness (generalized)  Unsteadiness on feet  Other abnormalities of gait and mobility  ONSET DATE: Ongoing.  Gradual.    SUBJECTIVE:  SUBJECTIVE STATEMENT: Pt reports no new changes. Pain still remains 3-4 and can get as high as a 6 or 7.     PERTINENT HISTORY:  Stroke, 4 prior lumbar surgeries.    PAIN:  Are you having pain? Yes: NPRS scale: 3 or 4/10.   Pain location: Bilateral LB/upper glut. Pain description: Ache and sore. Aggravating factors: As above. Relieving factors: As above.    PRECAUTIONS: Fall.  He is using his cane today and reports no falls with cane.    RED FLAGS: None   WEIGHT BEARING RESTRICTIONS: No  FALLS:  Has patient fallen in last 6 months? Yes. Number of falls He states he fell once after his lumbar surgery in February.  Reports no falls using cane which it is recommended he use at all times.     LIVING ENVIRONMENT: Lives with: lives with their spouse Lives in: House/apartment Stairs: 3 steps to enter. Has following equipment at home: Single point cane  OCCUPATION: Retired   PLOF: Independent with basic ADLs and Independent with household mobility with device  PATIENT GOALS: Decrease back pain.      OBJECTIVE:   PATIENT SURVEYS:  Modified ODI:  25/50.   (Eval) Modified ODI: 20/50 (08/01/24)  POSTURE: rounded shoulders, forward head, decreased lumbar lordosis, and increased thoracic kyphosis  PALPATION: CC is bilateral tenderness over bilateral upper gluteal musculature.  His bilateral lumbar musculature is very taut to palpation.    LUMBAR ROM:  Decreased by 25% and extension to neutral.    LOWER EXTREMITY MMT:   Right dorsiflexion dorsiflexion 1 to 1+/5, knee strength is 4+/5.    GAIT: Steppage gait due to right foot drop using a straight cane.    TREATMENT DATE:  08/01/24:                                   EXERCISE LOG  Exercise Repetitions and Resistance Comments  Nustep Level 4 x 10 minutes    Supine Ant/post pelvic tilt into pool noodle along lumbosacral joint x20   Supine hip abd red TB around knees x10   Supine clamshell alternating L&R red TB 2x10   Supine bridge with ball squeeze x20   Supine LTR x20   Supine bent knee fall outs 10x5   HMP and IFC at 80-150 Hz on 40% scan x 20 minutes.  Normal modality resposne following removal of modality. Rechecked goals   07/30/24:                                     EXERCISE LOG  Exercise Repetitions and Resistance Comments  Nustep Level 3 x 15 minutes    Back ext 50# x 5 minutes   Ab curls 50# x 5 minutes            STW/M x 13 minutes f/b HMP and IFC at 80-150 Hz on 40% scan x 20 minutes.  Normal modality resposne following removal of modality.  07/25/24:                                   EXERCISE LOG  Exercise Repetitions and Resistance Comments  Nustep Level 3 x 15 minutes   Counter  cat/cow x10   Modified quadruped on counter hip ext 2x10  Standing    Seated lumbar ext into towel x10   Seated ant/post pelvic tilt  2x10   Seated clamshell red TB 2 x 10    Seated ball squeeze 2 x 10   Seated figure 4 stretch 2X30       HMP and IFC at 80-150 Hz on 40% scan x 15 minutes.     07/23/24:                                   EXERCISE LOG  Exercise Repetitions and Resistance Comments  Nustep Level 3 x 17 minutes   Back ext 50# x 4 minutes   Ab curls 50# x 4 minutes   Rockerboard  5 minutes   Ball press downs 2 x 10    Ball rollouts 2 x 10   Ball squeeze 3 minutes    Scapular retraction Green XTS tubing 2 x 10   HMP and IFC at 80-150 Hz on 40% scan x 15 minutes.       PATIENT EDUCATION:  Education details:  Person educated:  International aid/development worker:  Education comprehension:   HOME EXERCISE PROGRAM: Access Code: [email or web address] URL: [email or web address] Date: 06/26/2024 Prepared by: Oseas Detty April Earnie Starring  Exercises - Seated Thoracic Lumbar Extension with Pectoralis Stretch  - 1 x daily - 7 x weekly - 1 sets - 10 reps - Seated Pelvic Tilt  - 1 x daily - 7 x weekly - 1 sets - 10 reps - Seated Figure 4 Piriformis Stretch  - 3 x daily - 7 x weekly - 2 sets - 30 sec hold - Seated Piriformis Stretch  - 3 x daily - 7 x weekly - 2 sets - 30 sec hold  ASSESSMENT:  CLINICAL IMPRESSION: Final PT session today. Finalized HEP to include hip and pelvic strengthening/mobilization. No change in pt's pain with amb. Pt has plans for pacemaker in December. Pt's modified Oswestry did decrease by 5 points which is near his goal level. At this point, he has maximized his potential and will d/c from PT.   OBJECTIVE IMPAIRMENTS: Abnormal gait, decreased activity tolerance, decreased ROM, decreased strength, increased muscle spasms, postural dysfunction, and pain.   ACTIVITY LIMITATIONS: carrying, lifting, bending, standing, and locomotion level  PARTICIPATION  LIMITATIONS: meal prep, cleaning, laundry, community activity, and yard work  PERSONAL FACTORS: Time since onset of injury/illness/exacerbation and 1-2 comorbidities: prior lumbar surgeries, stroke are also affecting patient's functional outcome.   REHAB POTENTIAL: Good  CLINICAL DECISION MAKING: Stable/uncomplicated  EVALUATION COMPLEXITY: Low   GOALS:   LONG TERM GOALS: Target date: 08/05/24  Ind with a HEP.  Goal status: MET.  2.  Walk a community distance with pain not > 3/10.  08/01/24: 6-7 at most Goal status: IN PROGRESS  3.  Improve ODI score by at least 6 points.  08/01/24: 20/50 Goal status: PARTIALLY MET  4.  Perform ADL's with pain not > 3/10.  08/01/24: 6-7 at most Goal status: IN PROGRESS   PLAN:  PT FREQUENCY: 2x/week  PT DURATION: 6 weeks  PLANNED INTERVENTIONS: 97110-Therapeutic exercises, 97530- Therapeutic activity, [id number]- Neuromuscular re-education, 97535- Self Care, 02859- Manual therapy, G0283- Electrical stimulation (unattended), 97035- Ultrasound, 79439 (1-2 muscles), 20561 (3+ muscles)- Dry Needling, Patient/Family education, and Moist heat.  PLAN FOR NEXT SESSION: Combo e'stim/US , STW/M, core exercise progression.  Hip extension/abduction. Hip stretching for SI   Evani Shrider April Ma L Dumas, PT, DPT 08/01/2024, 12:55  PM

## 2024-08-02 ENCOUNTER — Telehealth: Payer: Self-pay | Admitting: Cardiology

## 2024-08-02 NOTE — Telephone Encounter (Signed)
 Spoke with the patient and moved his procedure to 11/19. He will go to his PCP next week for lab work. Will send instruction letter through MyChart.

## 2024-08-02 NOTE — Telephone Encounter (Signed)
 Patient is calling in to see if he can moved his surgery date up. Please advise.

## 2024-08-05 ENCOUNTER — Telehealth: Payer: Self-pay

## 2024-08-05 ENCOUNTER — Encounter: Payer: Self-pay | Admitting: Radiology

## 2024-08-05 NOTE — Telephone Encounter (Signed)
-----   Message from Nurse Doreatha C sent at 08/02/2024  2:28 PM EDT ----- Regarding: 11/19 PPM implant Precert:  MD: Inocencio Type of implant: PPM Device manufacturer: [employer] Diagnosis: bradycardia CPT code: PPM implant, dual - 66791 Procedure scheduled (date/time): 11/19 at 230pm  Procedure:  Scrub given? Yes  Medication instructions: hold your Plavix (Clopidogrel) for 3 day(s) prior to your procedure.  Message sent to CV[facility]? No Added to calendar? Yes Orders entered? Yes Letter complete? Yes Scheduled with cath lab? Yes Labs ordered (CBC, BMET, PT/INR if on warfarin)? Yes Dye allergy? No Pre-meds ordered and instructions given? No, not needed Letter method: MyChart Special instructions: n/a H&P: 10/23  Follow-up:  Cassie/Angel, please schedule Routine.  Covering RN:  Please send this message to [employer], EP scheduler, EP Scheduling pool, and EP [employer].

## 2024-08-06 LAB — LAB REPORT - SCANNED
Albumin, Urine POC: 11.3
Creatinine, POC: 132.7 mg/dL
EGFR (Non-African Amer.): 71
Free T4: 1.37 ng/dL
Microalb Creat Ratio: 9

## 2024-08-09 ENCOUNTER — Ambulatory Visit: Admitting: Neurology

## 2024-08-09 VITALS — BP 138/60 | HR 47 | Resp 15

## 2024-08-09 DIAGNOSIS — M5416 Radiculopathy, lumbar region: Secondary | ICD-10-CM | POA: Diagnosis not present

## 2024-08-09 DIAGNOSIS — M48062 Spinal stenosis, lumbar region with neurogenic claudication: Secondary | ICD-10-CM

## 2024-08-09 NOTE — Procedures (Signed)
 Full Name: Mitchell Fields Gender: Male MRN #: [id number] Date of Birth: [birth year]    Visit Date: 08/09/2024 08:52 Age: 88 Years Examining Physician: Onita Duos Referring Physician: Margaret Height: 5 feet 9 inch History: 88 year old male with known history of severe lumbar degenerative disease multiple decompression surgery, presenting with slow Worsening right ankle weakness worsening gait abnormality  Summary of the test:  Nerve conduction study: Sural sensory responses were present bilaterally.  Absent right and left superficial peroneal sensory responses.  Bilateral peroneal to EDB motor responses were absent.  Bilateral tibial motor responses showed significantly decreased CMAP amplitude.  Left ulnar sensory and motor responses were normal.  Left median sensory response showed moderately prolonged peak latency with mildly decreased snap amplitude.  Left ulnar motor responses showed mildly prolonged distal latency, with well-preserved CMAP amplitude  Electromyography: Selected needle examination of bilateral lower extremity muscles, lumbosacral paraspinal muscles were performed.  There was noticeable chronic neuropathic changes involving bilateral L4-5 S1 myotomes, most noticeable at the right tibialis anterior, with evidence of active denervation, decreased number of large complex motor unit potentials.  There was also evidence of active denervation, complex motor unit potential at bilateral lower lumbar paraspinal muscles.  Conclusion: This is an abnormal study.  Superimposed on the chronic bilateral lumbosacral radiculopathy, involving bilateral L4-5 S1 myotomes, there is evidence of more profound active right L4-5 lumbar radiculopathy.  There is no evidence of significant peripheral neuropathy.  In addition, there is evidence of left distal median neuropathy across wrist consistent with moderate left carpal tunnel syndrome.    Duos Onita. M.D. Ph.D.   [facility] [address], [address] [place], [place] [address] Tel: [phone] Fax: [phone]  Verbal informed consent was obtained from the patient, patient was informed of potential risk of procedure, including bruising, bleeding, hematoma formation, infection, muscle weakness, muscle pain, numbness, among others.        MNC    Nerve / Sites Muscle Latency Ref. Amplitude Ref. Rel Amp Segments Distance Velocity Ref. Area    ms ms mV mV %  cm m/s m/s mVms  L Median - APB     Wrist APB 5.5 <=4.4 4.6 >=4.0 100 Wrist - APB 7   16.2     Upper arm APB 10.7  4.4  97.5 Upper arm - Wrist 26 51 >=49 14.8  L Ulnar - ADM     Wrist ADM 2.9 <=3.3 8.1 >=6.0 100 Wrist - ADM 7   23.1     B.Elbow ADM 5.4  7.0  86.9 B.Elbow - Wrist 16 65 >=49 23.8     A.Elbow ADM 8.1  6.2  88.2 A.Elbow - B.Elbow 16 57 >=49 22.9  L Peroneal - EDB     Ankle EDB NR <=6.5 NR >=2.0 NR Ankle - EDB 9   NR     Fib head EDB      Fib head - Ankle   >=44          Pop fossa - Ankle        R Peroneal - EDB     Ankle EDB NR <=6.5 NR >=2.0 NR Ankle - EDB 9   NR         Pop fossa - Ankle      L Tibial - AH     Ankle AH 5.1 <=5.8 0.7 >=4.0 100 Ankle - AH 9   1.7     Pop fossa AH 18.6  0.5  79.4 Pop fossa - Ankle 49 36 >=41 1.3  R Tibial - AH     Ankle AH 5.1 <=5.8 1.1 >=4.0 100 Ankle - AH 9   1.5     Pop fossa AH 16.4  0.9  85.6 Pop fossa - Ankle 41 36 >=41 1.2                 SNC    Nerve / Sites Rec. Site Peak Lat Ref.  Amp Ref. Segments Distance    ms ms V V  cm  L Radial - Anatomical snuff box (Forearm)     Forearm Wrist 2.5 <=2.9 15 >=15 Forearm - Wrist 10  R Sural - Ankle (Calf)     Calf Ankle 2.8 <=4.4 4 >=6 Calf - Ankle 14  L Sural - Ankle (Calf)     Calf Ankle 4.1 <=4.4 7 >=6 Calf - Ankle 14  L Superficial peroneal - Ankle     Lat leg Ankle NR <=4.4 NR >=6 Lat leg - Ankle 14    R Superficial peroneal - Ankle     Lat leg Ankle NR <=4.4 NR >=6 Lat leg - Ankle 14  L Median - Orthodromic (Dig II,  Mid palm)     Dig II Wrist 4.7 <=3.4 6 >=10 Dig II - Wrist 13  L Ulnar - Orthodromic, (Dig V, Mid palm)     Dig V Wrist 3.0 <=3.1 7 >=5 Dig V - Wrist 60                   F  Wave    Nerve F Lat Ref.   ms ms  L Tibial - AH 72.7 <=56.0  R Tibial - AH 66.0 <=56.0  L Ulnar - ADM 30.5 <=32.0           EMG Summary Table    Spontaneous MUAP Recruitment  Muscle IA Fib PSW Fasc Other Amp Dur. Poly Pattern  L. Tibialis anterior Increased None None None _______ Normal Normal Normal Reduced  L. Peroneus longus Normal None None None _______ Increased Increased 1+ Reduced  L. Tibialis posterior Normal None None None _______ Normal Normal Normal Reduced  L. Gastrocnemius (Medial head) Normal None None None _______ Normal Normal Normal Reduced  L. Vastus lateralis Normal None None None _______ Normal Normal Normal Reduced  L. Biceps femoris (long head) Normal None None None _______ Normal Normal Normal Reduced  L. Gluteus medius Normal None None None _______ Normal Normal Normal Reduced  L. Lumbar paraspinals (low) Increased None None None _______ Normal Normal Normal Normal  L. Lumbar paraspinals (mid) Increased None None None _______ Normal Normal Normal Normal  R. Tibialis anterior Increased 3+ 3+ None _______ Normal Normal Normal Discrete  R. Tibialis posterior Increased 1+ None None _______ Increased Increased 1+ Reduced  R. Gastrocnemius (Medial head) Increased None None None _______ Increased Increased 1+ Reduced  R. Vastus lateralis Normal None None None _______ Normal Normal Normal Reduced  R. Biceps femoris (long head) Normal None None None _______ Normal Normal Normal Reduced  R. Gluteus medius Normal None None None _______ Normal Normal Normal Reduced  R. Lumbar paraspinals (low) Increased None None None _______ Normal Normal Normal Normal  R. Lumbar paraspinals (mid) Increased None None None _______ Normal Normal Normal Normal

## 2024-08-09 NOTE — Progress Notes (Signed)
 Chief Complaint  Patient presents with   ncs/emg    Emg3, alone, Pt is well and ready for emg    ASSESSMENT AND PLAN  Mitchell Fields is a 88 y.o. male   Known history of multilevel lumbar degenerative changes, slow worsening distal right leg weakness,  EMG nerve conduction study August 09, 2024 showed chronic bilateral L4-5 S1 radiculopathy, worse on the right side L4-5 level,  Above findings is consistent with his most recent MRI study November 2024, multilevel degenerative changes, severe spinal stenosis L2-3 and L4-5, multi level neuroforaminal narrowing, most severe on the right at L3-4 L4-5,  I do think his current profound right distal weakness is from his lumbar issues,  Suggest right ankle brace, physical therapy,   DIAGNOSTIC DATA (LABS, IMAGING, TESTING) - I reviewed patient records, labs, notes, testing and imaging myself where available.   MEDICAL HISTORY:  Mitchell Fields, a 88 year old male, seen in request by Dr. Margaret for electrodiagnostic study of right foot drop, primary care physician is Dr. Loreli, Mitchell JONETTA Raddle., MD   History is obtained from the patient and review of electronic medical records. I personally reviewed pertinent available imaging films in PACS.   PMHx of  Stroke left frontal and parietal watershed in July 2025 History of multiple lumbar decompression surgery Hypertension Hyperlipidemia Hypothyroidism Coronary artery disease, status post bypass  Bradycardia, pending pacemaker placement on August 21, 2024  He had a long history of chronic low back issues, had multiple decompression surgery in the past Personally reviewed most recent MRI November 2024, severe spinal stenosis L2-3, L4-5, multilevel neuroforaminal stenosis, most severe on the right L3-4, L4-5  He has chronic low back pain, radiating pain to bilateral lower extremity, previous decompression surgery did help radiating pain, and somewhat of the low back pain  He only mild  improvement from his most recent lumbar decompression surgery in February 2025, before that, he had slow worsening right ankle weakness, increased gait abnormality, there was no significant improvement after surgery   PHYSICAL EXAM:   Vitals:   08/09/24 0822  BP: 138/60  Pulse: (!) 47  Resp: 15  SpO2: 95%   There is no height or weight on file to calculate BMI.  PHYSICAL EXAMNIATION:  Gen: NAD, conversant, well nourised, well groomed                     Cardiovascular: Regular rate rhythm, no peripheral edema, warm, nontender. Eyes: Conjunctivae clear without exudates or hemorrhage Neck: Supple, no carotid bruits. Pulmonary: Clear to auscultation bilaterally   NEUROLOGICAL EXAM:  MENTAL STATUS: Speech/cognition: Awake, alert, oriented to history taking and casual conversation CRANIAL NERVES: CN II: Visual fields are full to confrontation. Pupils are round equal and briskly reactive to light. CN III, IV, VI: extraocular movement are normal. No ptosis. CN V: Facial sensation is intact to light touch CN VII: Face is symmetric with normal eye closure  CN VIII: Hearing is normal to causal conversation. CN IX, X: Phonation is normal. CN XI: Head turning and shoulder shrug are intact  MOTOR: Mild atrophy of right anterior shin muscle,   LE Hip Flexion Knee flexion Knee extension Ankle Dorsiflexion Eversion Ankle plantar Flexion Inversion  R 5 5 5  0 1 4 4   L 5 5 5 5 5 5 5      REFLEXES: Reflexes are 1 and symmetric at the biceps, triceps, absent knees, and ankles. Plantar responses are mute  SENSORY: Length-dependent sensory loss  COORDINATION: There is  no trunk or limb dysmetria noted.  GAIT/STANCE: Push-up, profound right foot drop, unsteady gait  REVIEW OF SYSTEMS:  Full 14 system review of systems performed and notable only for as above All other review of systems were negative.   ALLERGIES: Allergies  Allergen Reactions   Cortisone Other (See Comments)     REACTION: shut adrenal glands down if given too much    HOME MEDICATIONS: Current Outpatient Medications  Medication Sig Dispense Refill   atorvastatin (LIPITOR) 20 MG tablet Take 20 mg by mouth daily.     Cholecalciferol (VITAMIN D PO) Take 2,000 Units by mouth daily.     clopidogrel (PLAVIX) 75 MG tablet Take 75 mg by mouth daily.     ezetimibe (ZETIA) 10 MG tablet Take 1 tablet by mouth daily.     gabapentin (NEURONTIN) 300 MG capsule Take 300 mg by mouth daily as needed.     HYDROcodone -acetaminophen  (NORCO) 7.5-325 MG tablet Take 1 tablet by mouth 2 (two) times daily as needed for moderate pain (pain score 4-6). 20 tablet 0   isosorbide  mononitrate (IMDUR ) 30 MG 24 hr tablet Take 0.5 tablets (15 mg total) by mouth daily. 90 tablet 3   Misc Natural Products (GLUCOSAMINE CHOND COMPLEX/MSM) TABS take 1 po qd     Multiple Vitamins-Minerals (ZINC PO) Take by mouth daily.     nitroGLYCERIN  (NITROSTAT ) 0.4 MG SL tablet Place 1 tablet (0.4 mg total) under the tongue every 5 (five) minutes as needed for chest pain. 90 tablet 3   pregabalin (LYRICA) 75 MG capsule as needed.     SYNTHROID 75 MCG tablet Take 75 mcg by mouth daily before breakfast.      Vitamin E 180 MG (400 UNIT) CAPS 1 capsule.     No current facility-administered medications for this visit.    PAST MEDICAL HISTORY: Past Medical History:  Diagnosis Date   Anemia    Arthritis    Back pain    Bursitis of left hip    Coronary artery disease    TWO VESSEL BYPASS SURGERY in January 1995   Diverticulosis    Hyperlipidemia    Hypertension    Monoclonal gammopathy    Neuropathy     PAST SURGICAL HISTORY: Past Surgical History:  Procedure Laterality Date   ADENOIDECTOMY     BACK SURGERY  1985, 1995, 2002   X3   BACK SURGERY  11/2023   COLONOSCOPY     CORONARY ARTERY BYPASS GRAFT     X2. LIMA GRAFT AND A RIMA GRAFT.   INGUINAL HERNIA REPAIR Left    Negative stress echo  08/11/2009   US  ECHOCARDIOGRAPHY  03/2011    Normal EF, mild aortic regurgitation, mild MR, LAE, mildly dilated RV, RAE, increased left atrial pressure, moderate to severe TR and mildly increased  PA systolic pressure.     FAMILY HISTORY: Family History  Problem Relation Age of Onset   Lung cancer Father 62       smoker   Diabetes Father    Cancer Sister        breast in one; bone cancer in other    SOCIAL HISTORY: Social History   Socioeconomic History   Marital status: Married    Spouse name: Not on file   Number of children: 2   Years of education: Not on file   Highest education level: Not on file  Occupational History   Occupation: retired    Associate Professor: RETIRED    Comment: forensic scientist.  [employer]  Tobacco Use   Smoking status: Former    Current packs/day: 0.00    Average packs/day: 2.0 packs/day for 5.0 years (10.0 ttl pk-yrs)    Types: Cigarettes    Start date: 10/03/1962    Quit date: 10/04/1967    Years since quitting: 56.8   Smokeless tobacco: Never  Vaping Use   Vaping status: Never Used  Substance and Sexual Activity   Alcohol use: Yes    Alcohol/week: 1.0 standard drink of alcohol    Types: 1 Shots of liquor per week    Comment: only on occasion ( 1 drink on friday)   Drug use: No   Sexual activity: Not Currently  Other Topics Concern   Not on file  Social History Narrative   Lives w/wife   Social Drivers of Health   Financial Resource Strain: Not on file  Food Insecurity: Not on file  Transportation Needs: Not on file  Physical Activity: Not on file  Stress: Not on file  Social Connections: Not on file  Intimate Partner Violence: Not on file      Modena Callander, M.D. Ph.D.  [facility] [address], [address] [place], [place] [address] Ph: [phone] Fax: [phone]  CC:  Lujean Sieving, MD [address] [place],  [place] [address]  Loreli Mitchell JONETTA Mickey., MD

## 2024-08-12 ENCOUNTER — Encounter: Payer: Self-pay | Admitting: Emergency Medicine

## 2024-08-20 NOTE — Pre-Procedure Instructions (Addendum)
 Instructed patient on the following items: Arrival time 1230 Nothing to eat or drink after midnight No meds AM of procedure Responsible person to drive you home and stay with you for 24 hrs Wash with special soap night before and morning of procedure If on anti-coagulant drug instructions Plavix- patient didn't stop the Plavix, instructed him to not take dose tomorrow.  Dr Inocencio aware.

## 2024-08-21 ENCOUNTER — Ambulatory Visit (HOSPITAL_COMMUNITY): Admission: RE | Disposition: A | Payer: Self-pay | Source: Home / Self Care | Attending: Cardiology

## 2024-08-21 ENCOUNTER — Ambulatory Visit (HOSPITAL_COMMUNITY)

## 2024-08-21 ENCOUNTER — Ambulatory Visit (HOSPITAL_COMMUNITY)
Admission: RE | Admit: 2024-08-21 | Discharge: 2024-08-21 | Disposition: A | Discharge status: Home / Self Care | Attending: Cardiology | Admitting: Cardiology

## 2024-08-21 ENCOUNTER — Other Ambulatory Visit: Payer: Self-pay

## 2024-08-21 DIAGNOSIS — Z8673 Personal history of transient ischemic attack (TIA), and cerebral infarction without residual deficits: Secondary | ICD-10-CM | POA: Diagnosis not present

## 2024-08-21 DIAGNOSIS — J84112 Idiopathic pulmonary fibrosis: Secondary | ICD-10-CM | POA: Insufficient documentation

## 2024-08-21 DIAGNOSIS — I5032 Chronic diastolic (congestive) heart failure: Secondary | ICD-10-CM | POA: Diagnosis not present

## 2024-08-21 DIAGNOSIS — I110 Hypertensive heart disease with heart failure: Secondary | ICD-10-CM | POA: Diagnosis not present

## 2024-08-21 DIAGNOSIS — I2510 Atherosclerotic heart disease of native coronary artery without angina pectoris: Secondary | ICD-10-CM | POA: Diagnosis not present

## 2024-08-21 DIAGNOSIS — I442 Atrioventricular block, complete: Secondary | ICD-10-CM | POA: Insufficient documentation

## 2024-08-21 DIAGNOSIS — D638 Anemia in other chronic diseases classified elsewhere: Secondary | ICD-10-CM | POA: Insufficient documentation

## 2024-08-21 DIAGNOSIS — Z951 Presence of aortocoronary bypass graft: Secondary | ICD-10-CM | POA: Insufficient documentation

## 2024-08-21 DIAGNOSIS — E785 Hyperlipidemia, unspecified: Secondary | ICD-10-CM | POA: Insufficient documentation

## 2024-08-21 HISTORY — PX: PACEMAKER IMPLANT: EP1218

## 2024-08-21 SURGERY — PACEMAKER IMPLANT
Anesthesia: LOCAL

## 2024-08-21 MED ORDER — SODIUM CHLORIDE 0.9 % IV SOLN: INTRAVENOUS | Status: DC

## 2024-08-21 MED ORDER — MIDAZOLAM HCL 5 MG/5ML IJ SOLN
INTRAMUSCULAR | Status: DC | PRN
Start: 2024-08-21 — End: 2024-08-21
  Administered 2024-08-21 (×2): 1 mg via INTRAVENOUS

## 2024-08-21 MED ORDER — POVIDONE-IODINE 10 % EX SWAB
2.0000 | Freq: Once | CUTANEOUS | Status: AC
  Administered 2024-08-21: 2 via TOPICAL

## 2024-08-21 MED ORDER — ONDANSETRON HCL 4 MG/2ML IJ SOLN: 4.0000 mg | Freq: Four times a day (QID) | INTRAMUSCULAR | Status: DC | PRN

## 2024-08-21 MED ORDER — FENTANYL CITRATE (PF) 100 MCG/2ML IJ SOLN
INTRAMUSCULAR | Status: AC
  Filled 2024-08-21: qty 2

## 2024-08-21 MED ORDER — LIDOCAINE HCL (PF) 1 % IJ SOLN
INTRAMUSCULAR | Status: DC | PRN
Start: 2024-08-21 — End: 2024-08-21
  Administered 2024-08-21: 60 mL via SUBCUTANEOUS

## 2024-08-21 MED ORDER — HEPARIN (PORCINE) IN NACL 1000-0.9 UT/500ML-% IV SOLN
INTRAVENOUS | Status: DC | PRN
  Administered 2024-08-21: 500 mL

## 2024-08-21 MED ORDER — CEFAZOLIN SODIUM-DEXTROSE 2-4 GM/100ML-% IV SOLN
2.0000 g | INTRAVENOUS | Status: AC
  Administered 2024-08-21: 2 g via INTRAVENOUS

## 2024-08-21 MED ORDER — CHLORHEXIDINE GLUCONATE 4 % EX SOLN
4.0000 | Freq: Once | CUTANEOUS | Status: DC
  Filled 2024-08-21: qty 60

## 2024-08-21 MED ORDER — FENTANYL CITRATE (PF) 100 MCG/2ML IJ SOLN
INTRAMUSCULAR | Status: DC | PRN
  Administered 2024-08-21: 25 ug via INTRAVENOUS

## 2024-08-21 MED ORDER — CEFAZOLIN SODIUM-DEXTROSE 2-4 GM/100ML-% IV SOLN
INTRAVENOUS | Status: AC
  Filled 2024-08-21: qty 100

## 2024-08-21 MED ORDER — LIDOCAINE HCL (PF) 1 % IJ SOLN
INTRAMUSCULAR | Status: AC
  Filled 2024-08-21: qty 60

## 2024-08-21 MED ORDER — SODIUM CHLORIDE 0.9 % IV SOLN
INTRAVENOUS | Status: AC
  Administered 2024-08-21: 80 mg
  Filled 2024-08-21: qty 2

## 2024-08-21 MED ORDER — MIDAZOLAM HCL 2 MG/2ML IJ SOLN
INTRAMUSCULAR | Status: AC
  Filled 2024-08-21: qty 2

## 2024-08-21 MED ORDER — SODIUM CHLORIDE 0.9 % IV SOLN: 80.0000 mg | INTRAVENOUS | Status: AC

## 2024-08-21 MED ORDER — ACETAMINOPHEN 325 MG PO TABS: 325.0000 mg | ORAL_TABLET | ORAL | Status: DC | PRN

## 2024-08-21 SURGICAL SUPPLY — 13 items
CABLE SURGICAL S-101-97-12 (CABLE) ×1 IMPLANT
CATH CPS LOCATOR 3D MED (CATHETERS) IMPLANT
LEAD ULTIPACE 52 LPA1231/52 (Lead) IMPLANT
LEAD ULTIPACE 65 LPA1231/65 (Lead) IMPLANT
PACEMAKER ASSURITY DR-RF (Pacemaker) IMPLANT
PAD DEFIB RADIO PHYSIO CONN (PAD) ×1 IMPLANT
SHEATH 7FR PRELUDE SNAP 13 (SHEATH) IMPLANT
SHEATH 9FR PRELUDE SNAP 13 (SHEATH) IMPLANT
SHEATH PROBE COVER 6X72 (BAG) IMPLANT
SLITTER AGILIS HISPRO (INSTRUMENTS) IMPLANT
TOOL HELIX LOCKING (MISCELLANEOUS) IMPLANT
TRAY PACEMAKER INSERTION (PACKS) ×1 IMPLANT
WIRE HI TORQ VERSACORE-J 145CM (WIRE) IMPLANT

## 2024-08-21 NOTE — Progress Notes (Signed)
 Spoke with Dr. Inocencio and notified of XRAY results. Patient to restart Plavix August 25, 2024. Notified provider of blood pressure while on the unit. Per the provider the patient needs to call primary care provider in the morning. Notified the patient and wife. Expressed understanding.

## 2024-08-21 NOTE — Discharge Instructions (Addendum)
 After Your Pacemaker   You have a Abbott Pacemaker  If you have a Medtronic or Biotronik device, plug in your home monitor once you get home, and no manual interaction is required.   If you have an Abbott or Aut[employer]one device, plug your home monitor once you get home, sit near the device, and press the large activation button. Sit nearby until the process is complete, usually notated by lights on the monitor.   If you were set up for monitoring using an app on your phone, make sure the app remains open in the background and the Bluetooth remains on.  ACTIVITY Do not lift your arm above shoulder height for 1 week after your procedure. After 7 days, you may progress as below.  You should remove your sling 24 hours after your procedure, unless otherwise instructed by your provider.     Wednesday August 28, 2024  Thursday August 29, 2024 Friday August 30, 2024 Saturday August 31, 2024   Do not lift, push, pull, or carry anything over 10 pounds with the affected arm until 6 weeks (Wednesday October 02, 2024 ) after your procedure.   You may drive AFTER your wound check, unless you have been told otherwise by your provider.   Ask your healthcare provider when you can go back to work   INCISION/Dressing If you are on a blood thinner such as Coumadin, Xarelto, Eliquis, Plavix, or Pradaxa please confirm with your provider when this should be resumed. Sunday, August 25, 2024  If large square, outer bandage is left in place, this can be removed after 24 hours from your procedure. Do not remove steri-strips or glue as below.   If a PRESSURE DRESSING (a bulky dressing that usually goes up over your shoulder) was applied or left in place, please follow instructions given by your provider on when to return to have this removed.   Monitor your Pacemaker site for redness, swelling, and drainage. Call the device clinic at [phone] if you experience these symptoms or  fever/chills.  If your incision is sealed with Steri-strips or staples, you may shower 7 days after your procedure or when told by your provider. Do not remove the steri-strips or let the shower hit directly on your site. You may wash around your site with soap and water.    If you were discharged in a sling, please do not wear this during the day more than 48 hours after your surgery unless otherwise instructed. This may increase the risk of stiffness and soreness in your shoulder.   Avoid lotions, ointments, or perfumes over your incision until it is well-healed.  You may use a hot tub or a pool AFTER your wound check appointment if the incision is completely closed.  Pacemaker Alerts:  Some alerts are vibratory and others beep. These are NOT emergencies. Please call our office to let us  know. If this occurs at night or on weekends, it can wait until the next business day. Send a remote transmission.  If your device is capable of reading fluid status (for heart failure), you will be offered monthly monitoring to review this with you.   DEVICE MANAGEMENT Remote monitoring is used to monitor your pacemaker from home. This monitoring is scheduled every 91 days by our office. It allows us  to keep an eye on the functioning of your device to ensure it is working properly. You will routinely see your Electrophysiologist annually (more often if necessary).  This will appear as a REMOTE  check on your MyChart schedule. These are automatic and there is nothing for you to manually do unless otherwise instructed.  You should receive your ID card for your new device in 4-8 weeks. Keep this card with you at all times once received. Consider wearing a medical alert bracelet or necklace.  Your Pacemaker may be MRI compatible. This will be discussed at your next office visit/wound check.  You should avoid contact with strong electric or magnetic fields.   Do not use amateur (ham) radio equipment or electric  (arc) welding torches. MP3 player headphones with magnets should not be used. Some devices are safe to use if held at least 12 inches (30 cm) from your Pacemaker. These include power tools, lawn mowers, and speakers. If you are unsure if something is safe to use, ask your health care provider.  When using your cell phone, hold it to the ear that is on the opposite side from the Pacemaker. Do not leave your cell phone in a pocket over the Pacemaker.  You may safely use electric blankets, heating pads, computers, and microwave ovens.  Call the office right away if: You have chest pain. You feel more short of breath than you have felt before. You feel more light-headed than you have felt before. Your incision starts to open up.  This information is not intended to replace advice given to you by your health care provider. Make sure you discuss any questions you have with your health care provider.

## 2024-08-21 NOTE — Interval H&P Note (Signed)
 History and Physical Interval Note:  08/21/2024 2:09 PM  Mitchell Fields  has presented today for surgery, with the diagnosis of complete heart block.  The various methods of treatment have been discussed with the patient and family. After consideration of risks, benefits and other options for treatment, the patient has consented to  Procedure(s): PACEMAKER IMPLANT (N/A) as a surgical intervention.  The patient's history has been reviewed, patient examined, no change in status, stable for surgery.  I have reviewed the patient's chart and labs.  Questions were answered to the patient's satisfaction.     Tieisha Darden [employer]

## 2024-08-22 ENCOUNTER — Telehealth: Payer: Self-pay

## 2024-08-22 ENCOUNTER — Encounter (HOSPITAL_COMMUNITY): Payer: Self-pay | Admitting: Cardiology

## 2024-08-22 ENCOUNTER — Telehealth: Payer: Self-pay | Admitting: Cardiology

## 2024-08-22 MED FILL — Midazolam HCl Inj 2 MG/2ML (Base Equivalent): INTRAMUSCULAR | Qty: 2 | Status: AC

## 2024-08-22 NOTE — Telephone Encounter (Signed)
 Pt c/o of Chest Pain: STAT if active (IN THIS MOMENT) CP, including tightness, pressure, jaw pain, shoulder/upper arm/back pain, SOB, nausea, and vomiting.   1. Are you having CP right now (tightness, pressure, or discomfort)?  Yes   2. Are you experiencing any other symptoms (ex. SOB, nausea, vomiting, sweating)? No   3. How long have you been experiencing CP? After his procedure   4. Is your CP continuous or coming and going? Coming and going   5. Have you taken Nitroglycerin ?  He has it but has not taken it   6. If CP returns before callback, please consider calling 911. ?   Patient's BP was 160/100 HR 100-112       I spoke with the patient and he stated that he is having chest pain and his heart rate is elevated. He had a pacemaker placed yesterday 11/19 and this pain started when he left the hospital yesterday. I gave ED precautions and am routing this to our device triage for further advise.

## 2024-08-22 NOTE — Telephone Encounter (Signed)
 Returned call to Pt.  Pt states he is doing ok.  Not sure he feels much better.  Does not feel any worse.  He states his BP has improved.  BP 143/89 heart rate 93.  Advised Pt to continue to rest and relax.  This nurse will call him back at 3:30 pm.  He will take his BP/pulse prior to that call.

## 2024-08-22 NOTE — Telephone Encounter (Signed)
 Call placed to Pt.  Pt has a new Abbott PPM implanted yesterday.  He states his blood pressure and heart rate are elevated.  He states he is having mild chest pain.  He states this pain is reminiscent of his heart attack he had in 1995.  Attempted to assist Pt to send manual transmission.  Will need assistance with this.  Pt states his heart rate has been elevated since his hospitalization 90-120 bpm.  It is possible that if rate response has been turned on and is too aggressive, this could cause chest pain.    Will get transmission prior to making final recommendations.  Pt aware will contact him with assistance to send transmission.  He is aware if pain increases he should call 911.  Will follow up ASAP.

## 2024-08-22 NOTE — Telephone Encounter (Signed)
 Returned call to Pt.  Per Pt he is feeling much better.    He states his blood pressure is down to 130/90 and his heart rate is 85 bpm.  The chest pressure has resolved.  Pt states he stopped taking his Toprol 6 months ago.  Advised Pt that he should resume taking it once a day.  Pt in agreement.  Denies need for any refills at this time.  He will call back if he has any further issues.

## 2024-08-22 NOTE — Telephone Encounter (Signed)
 Pt c/o of Chest Pain: STAT if active (IN THIS MOMENT) CP, including tightness, pressure, jaw pain, shoulder/upper arm/back pain, SOB, nausea, and vomiting.  1. Are you having CP right now (tightness, pressure, or discomfort)?  Yes  2. Are you experiencing any other symptoms (ex. SOB, nausea, vomiting, sweating)? No  3. How long have you been experiencing CP? After his procedure  4. Is your CP continuous or coming and going? Coming and going  5. Have you taken Nitroglycerin ?  He has it but has not taken it  6. If CP returns before callback, please consider calling 911. ?  Patient's BP was 160/100 HR 100-112

## 2024-08-22 NOTE — Telephone Encounter (Signed)
 Follow-up after same day discharge: Implant date: 08/21/2024 MD: Soyla Norton, MD Device: PPM  Location: L chest    Wound check visit: 09/03/2024 90 day MD follow-up: 11/21/2024  Remote Transmission received:yes   Dressing/sling removed: yes  Confirm OAC restart on: yes   Please continue to monitor your cardiac device site for redness, swelling, and drainage. Call the device clinic at [phone] if you experience these symptoms, fever/chills, or have questions about your device.   Remote monitoring is used to monitor your cardiac device from home. This monitoring is scheduled every 91 days by our office. It allows us  to keep an eye on the functioning of your device to ensure it is working properly.

## 2024-08-22 NOTE — Telephone Encounter (Signed)
 Transmission received.  Presenting rhythm:  AS/VP 93 bpm.  V pacing >99%.  Histograms indicate atrial rates as high as 160 bpm.  Pt max track rate 120 bpm.  Returned call to Pt.  Per medication list Pt take Toprol XL 50 mg daily.  Asked Pt if he takes this medication.  He states he does, but he has not taken it in a few days.  Pt with some expected confusion on how his pacemaker works.  Attempted to educate Pt on the difference between sinus node dysfunction and heart block.    Plan:  Pt is taking his Toprol now.  If chest pressure does not improve within 2 hours will recommend Pt go to hospital for evaluation.  However, suspect chest pressure related to underlying cardiac disease and now ventricularly tracking sinus rhythm.  Discussed with Dr. Inocencio who is in agreement with plan.  Will continue to follow.  Plan to call Pt at 1:23 pm.

## 2024-09-02 NOTE — Progress Notes (Unsigned)
 Office Visit    Patient Name: Mitchell Fields Date of Encounter: 09/02/2024  PCP:  Mitchell Elsie JONETTA Mickey., MD   [facility]  Cardiologist:  Mitchell Lafitte, MD  Advanced Practice Provider:  No care team member to display Electrophysiologist:  Mitchell Fields Norton, MD      Chief Complaint    Mitchell Fields is a 88 y.o. male with a hx of CAD s/p CABG, hypertension, hyperlipidemia, monoclonal gammopathy, IPF, anemia of chronic disease presents today for follow-up   Past Medical History    Past Medical History:  Diagnosis Date   Anemia    Arthritis    Back pain    Bursitis of left hip    Coronary artery disease    TWO VESSEL BYPASS SURGERY in January 1995   Diverticulosis    Hyperlipidemia    Hypertension    Monoclonal gammopathy    Neuropathy    Past Surgical History:  Procedure Laterality Date   ADENOIDECTOMY     BACK SURGERY  1985, 1995, 2002   X3   BACK SURGERY  11/2023   COLONOSCOPY     CORONARY ARTERY BYPASS GRAFT     X2. LIMA GRAFT AND A RIMA GRAFT.   INGUINAL HERNIA REPAIR Left    Negative stress echo  08/11/2009   PACEMAKER IMPLANT N/A 08/21/2024   Procedure: PACEMAKER IMPLANT;  Surgeon: Fields Soyla Gladis, MD;  Location: [facility];  Service: Cardiovascular;  Laterality: N/A;   US  ECHOCARDIOGRAPHY  03/2011   Normal EF, mild aortic regurgitation, mild MR, LAE, mildly dilated RV, RAE, increased left atrial pressure, moderate to severe TR and mildly increased  PA systolic pressure.     Allergies  Allergies  Allergen Reactions   Cortisone Other (See Comments)    REACTION: shut adrenal glands down if given too much    History of Present Illness    Mitchell Fields is a 88 y.o. male with a hx of CAD s/p CABG, hypertension, hyperlipidemia, monoclonal gammopathy, IPF, anemia of chronic disease last seen 01/31/2022  He has a known history of remote MI, CABG in 1995 in [place] (LIMA and RIMA grafts).  Echo June 2012 with normal EF,  mild aortic regurgitation, mild MR, mild to moderate LAE, mildly dilated RV, moderate RAE, moderate to severe TR and redundant atrial septum consistent with increased left atrial pressure.  Echo 2014 mild LV dysfunction EF 45-50%, mildly AI/MR.  Saw Mitchell Fields in November 2021 for fatigue.  Had been transition from atenolol to Toprol due to decreased LVEF.  Repeat echo unchanged.  He had been diagnosed with IPF via CT and was following with Dr. Theophilus. Tried on Esbriet  but unable to tolerate due to marked fatigue.   Seen 10/19/2021 doing well from cardiac perspective.  Was recovering from bronchitis with antibiotics.  He was seen 01/31/2022 with 2 episodes of chest pain.  Cardiac PET scan ordered and performed 6 6/23 revealing prior MI with mild  peri-infarct ischemia.  It was moderate high risk in the presence of reduced LVEF and infarction size.  LVEF 44%.  Study detailed below.  We recommended optimization of antianginal therapy. Imdur  was added. Since then he denies any chest pain.  He also wore a Zio patch monitor which showed brief nonsustained runs of SVT and frequent PACs and PVCs. These were all asymptomatic.   n 04/07/2024, he had an episode where he was going to church.  All of a sudden felt confused and not  well.  His wife is a engineer, civil (consulting) and did not feel like he had any signs or symptoms of stroke.  He laid down and went to sleep.  He followed up with his PCP 04/09/2024 for the evaluation of lightheadedness and fatigue.  MRI of the brain showed subacute to acute infarctions of the left frontal and parietal watershed areas.  He underwent echo that showed a normal ejection fraction with a negative bubble study.  He wore a cardiac monitor that showed episodes of 2-1 AV block and bradycardia with heart rates in the 30s.  He had complete heart block which was overnight. He underwent permanent pacemaker implant by Dr Mitchell Fields on 08/21/24.      EKGs/Labs/Other Studies Reviewed:   The following studies  were reviewed today  Cardiac PET 03/08/2022  Narrative & Impression      Findings are consistent with prior myocardial infarction with peri-infarct ischemia. The study is moderate-high risk in the presence reduced LVEF and infarction size.   LV perfusion is abnormal. There is a perfusion defect in the mid & basal inferolateral and basal inferior that worsens with stress and is associated with hypokinesis. This is a moderate (3-6 segments) size and moderate (10-20% of LV myocardium) severity lesion. Consistent with infarction with small peri-infarct ischemia.   There is no evidence of transient ischemic dilation, TID 1.02   Rest left ventricular function is low normal (normal for modality). Rest EF: 44 %. Stress left ventricular function is normal. Stress EF: 51 %. End diastolic cavity size is normal. End systolic cavity size is normal.   LVEF Reserve is normal at 7%   Myocardial blood flow reserve is not reported in this patient due to technical or patient-specific concerns that affect accuracy (Prior CABG)   Coronary calcium was present on the attenuation correction CT images. Severe coronary calcifications were present. Coronary calcifications were present in the left anterior descending artery, left circumflex artery and right coronary artery distribution(s). Aortic atherosclerosis noted.  Aortic valve calcification noted.   Electronically signed by Mitchell Leavens MD     Echo 03/26/13:- Left ventricle: The cavity size was normal. Wall thickness   was normal. Systolic function was mildly reduced. The   estimated ejection fraction was in the range of 45% to   50%. There is hypokinesis of the basal-mid inferior   posterior myocardium. Doppler parameters are consistent   with abnormal left ventricular relaxation (grade 1   diastolic dysfunction). - Aortic valve: Mild regurgitation. - Mitral valve: Mild regurgitation. - Pulmonary arteries: PA peak pressure: 35mm Hg (S).   Echo 09/01/20:  IMPRESSIONS     1. Left ventricular ejection fraction, by estimation, is 45 to 50%. The  left ventricle has mildly decreased function. The left ventricle  demonstrates regional wall motion abnormalities (see scoring  diagram/findings for description). There is mild  concentric left ventricular hypertrophy. Left ventricular diastolic  parameters are consistent with Grade I diastolic dysfunction (impaired  relaxation). Elevated left atrial pressure.   2. Right ventricular systolic function is normal. The right ventricular  size is mildly enlarged. There is moderately elevated pulmonary artery  systolic pressure. The estimated right ventricular systolic pressure is  46.4 mmHg.   3. Left atrial size was mildly dilated.   4. Right atrial size was moderately dilated.   5. The mitral valve is normal in structure. Mild to moderate mitral valve  regurgitation. No evidence of mitral stenosis.   6. Tricuspid valve regurgitation is moderate.   7. The aortic valve  is normal in structure. Aortic valve regurgitation is  mild. Mild to moderate aortic valve sclerosis/calcification is present,  without any evidence of aortic stenosis.   8. The inferior vena cava is normal in size with <50% respiratory  variability, suggesting right atrial pressure of 8 mmHg.    Event monitor 04/01/22: Study Highlights    Normal sinus rhythm. Occasional runs of SVT total 43 runs, longest lasting 13 beats. Frequent PACs 6.5% burden Frequent isolated PVCs burden 5.8%. No sustained tachy or bradyarrhythmias, AV block or pauses No Afib     Patch Wear Time:  7 days and 2 hours (2023-06-19T11:18:40-0400 to 2023-06-26T14:06:28-0400)   Patient had a min HR of 42 bpm, max HR of 176 bpm, and avg HR of 74 bpm. Predominant underlying rhythm was Sinus Rhythm. Bundle Branch Block/IVCD was present. 43 Supraventricular Tachycardia runs occurred, the run with the fastest interval lasting 6  beats with a max rate of 176 bpm, the  longest lasting 13 beats with an avg rate of 126 bpm. Some episodes of Supraventricular Tachycardia may be possible Atrial Tachycardia with variable block. Isolated SVEs were frequent (6.5%, [id number]), SVE Couplets were  rare (<1.0%, 3224), and SVE Triplets were rare (<1.0%, 380). Isolated VEs were frequent (5.8%, 43313), VE Couplets were rare (<1.0%, 2323), and no VE Triplets were present. Ventricular Bigeminy and Trigeminy were present.   Echo 05/30/24:  IMPRESSIONS     1. Left ventricular ejection fraction, by estimation, is 60 to 65%. The  left ventricle has normal function. The left ventricle has no regional  wall motion abnormalities. There is mild left ventricular hypertrophy of  the basal-septal segment. Left  ventricular diastolic parameters were normal.   2. Right ventricular systolic function is normal. The right ventricular  size is mildly enlarged. There is mildly elevated pulmonary artery  systolic pressure. The estimated right ventricular systolic pressure is  37.1 mmHg.   3. The mitral valve is normal in structure. Mild mitral valve  regurgitation. No evidence of mitral stenosis.   4. The aortic valve is tricuspid. There is mild calcification of the  aortic valve. There is mild thickening of the aortic valve. Aortic valve  regurgitation is mild. Aortic valve sclerosis/calcification is present,  without any evidence of aortic  stenosis. Aortic valve area, by VTI measures 2.22 cm. Aortic valve mean  gradient measures 10.0 mmHg. Aortic valve Vmax measures 2.22 m/s.   5. The inferior vena cava is normal in size with greater than 50%  respiratory variability, suggesting right atrial pressure of 3 mmHg.   6. Agitated saline contrast bubble study was negative, with no evidence  of any interatrial shunt.   Conclusion(s)/Recommendation(s): No intracardiac source of embolism  detected on this transthoracic study. Consider a transesophageal  echocardiogram to exclude cardiac source  of embolism if clinically  indicated.    EKG: No EKG today  Recent Labs: 05/30/2024: ALT 19; Pro B Natriuretic peptide (BNP) 725.0 06/11/2024: BNP 146.4; BUN 24; Creatinine, Ser 1.08; Hemoglobin 11.0; Platelets 321; Potassium 4.7; Sodium 134  Recent Lipid Panel    Component Value Date/Time   CHOL 178 03/23/2020 1333   TRIG 142 03/23/2020 1333   HDL 54 03/23/2020 1333   CHOLHDL 3.3 03/23/2020 1333   VLDL 28 03/23/2020 1333   LDLCALC 96 03/23/2020 1333   Dated 05/26/22: cholesterol 155, triglycerides 168, HDL 41, LDL 80. CMET and TSH normal.  Home Medications   No outpatient medications have been marked as taking for the 09/12/24 encounter (Appointment) with  Mairany Bruno M, MD.     Review of Systems      All other systems reviewed and are otherwise negative except as noted above.  Physical Exam    VS:  There were no vitals taken for this visit. , BMI There is no height or weight on file to calculate BMI.  Wt Readings from Last 3 Encounters:  08/21/24 131 lb (59.4 kg)  07/25/24 134 lb 12.8 oz (61.1 kg)  06/14/24 136 lb 12.8 oz (62.1 kg)    GEN: Well nourished, well developed, in no acute distress. HEENT: normal. Neck: Supple, no JVD, carotid bruits, or masses. Cardiac: RRR, no murmurs, rubs, or gallops. No clubbing, cyanosis, edema.  Radials/PT 2+ and equal bilaterally.  Respiratory:  Respirations regular and unlabored, clear to auscultation bilaterally. GI: Soft, nontender, nondistended. MS: No deformity or atrophy. Skin: Warm and dry, no rash. Neuro:  Strength and sensation are intact. Psych: Normal affect.  Assessment & Plan    CAD -  Prior CABG 1995.  Cardiac PET 03/08/2022 consistent with prior MI and mild  peri-infarct ischemia with EF 44%.  He was started on Imdur  15 mg daily. Stable on low-dose Toprol 12.5 mg daily and Mitchell not further increase due to bifascicular block.  Currently no anginal symptoms. Continue atorvastatin, aspirin.  HLD. On lipitor. Last LDL  80. Ideally would like <70 but with recent weight loss Mitchell hold off on any adjustment in therapy. Repeat labs with PCP this summer.  HTN - BP well controlled. Continue current antihypertensive regimen Toprol 12.5mg  QD, Imdur  15 mg daily.    Mild LV dysfunction / HFrEF - Euvolemic and well compensated on exam.  Not requiring loop diuretic.  Continue Toprol 12.5 mg daily.  Continue low-salt diet, fluid restrict to less than 2 L.  GDMT limited by relative hypotension.  Bifasisular block /palpitations- now with 2:1 AV block and syncope. S/p permanent pacemaker.   6.   IPF - Follows with Dr. Theophilus   Disposition: Follow up in 6 months  Signed, Korde Jeppsen, MD, [facility] 09/02/2024, 1:48 PM [facility]

## 2024-09-03 ENCOUNTER — Ambulatory Visit: Admitting: Pulmonary Disease

## 2024-09-03 ENCOUNTER — Encounter: Payer: Self-pay | Admitting: Pulmonary Disease

## 2024-09-03 ENCOUNTER — Ambulatory Visit: Attending: Cardiology

## 2024-09-03 VITALS — BP 149/74 | HR 81 | Wt 150.0 lb

## 2024-09-03 DIAGNOSIS — J84112 Idiopathic pulmonary fibrosis: Secondary | ICD-10-CM

## 2024-09-03 DIAGNOSIS — R0600 Dyspnea, unspecified: Secondary | ICD-10-CM

## 2024-09-03 DIAGNOSIS — Z5181 Encounter for therapeutic drug level monitoring: Secondary | ICD-10-CM

## 2024-09-03 DIAGNOSIS — I442 Atrioventricular block, complete: Secondary | ICD-10-CM | POA: Diagnosis not present

## 2024-09-03 NOTE — Patient Instructions (Signed)
   After Your Pacemaker   Monitor your pacemaker site for redness, swelling, and drainage. Call the device clinic at [phone] if you experience these symptoms or fever/chills.  Your incision was closed with Steri-strips or staples:  You may shower after today's visit and wash your incision with soap and water. Avoid lotions, ointments, or perfumes over your incision until it is well-healed.  You may use a hot tub or a pool after your wound check appointment if the incision is completely closed.  Do not lift, push or pull greater than 10 pounds with the affected arm until 6 weeks after your procedure. There are no other restrictions in arm movement after your wound check appointment.  You may drive, unless driving has been restricted by your healthcare providers.  Remote monitoring is used to monitor your pacemaker from home. This monitoring is scheduled every 91 days by our office. It allows us  to keep an eye on the functioning of your device to ensure it is working properly. You will routinely see your Electrophysiologist annually (more often if necessary).

## 2024-09-03 NOTE — Progress Notes (Unsigned)
 Mitchell Fields    [id number]    04-29-1936  Primary Care Physician:Shaw, [name], MD  Referring Physician: Loreli [name], MD [address] [place],  [place] [address]  Chief complaint:  Follow-up for interstitial lung disease, pulmonary fibrosis Did not tolerate Esbriet  August 2023 Did not tolerate Ofev  October 2023 Acute visit for dyspnea  HPI: 88 y.o.  with history of monoclonal gammopathy, hypertension, hyperlipidemia, coronary artery disease Referred for evaluation of pulmonary fibrosis noted on recent CT chest  He follows with Dr. Lonn for monoclonal gammopathy and had a CT chest abdomen pelvis for evaluation of unexplained weight loss.  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   Chief complaint is fatigue, chronic dyspnea on exertion which is worsening gradually over the past few years. He had a consultation with Dr. Jeannetta, rheumatology for elevated CCP.  No evidence of rheumatoid arthritis noted  Started Esbriet  in April 2023.  He had issues with high blood pressure and fatigue with the medication. We gave a break from the medication and earlier this year but he had recurrence of symptoms.  Finally stopped taking it and end of August 2023 He was then tried on Ofev  but had to stop and October 2023 for similar issues of weight loss and GI intolerance Pacemaker implantation in November 2025 for complete heart block  Developed left MCA ischemic stroke July 2025  Interim history:  Discussed the use of AI scribe software for clinical note transcription with the patient, who gave verbal consent to proceed. History of Present Illness   Interval history: Mitchell Fields is an 88 year old male with idiopathic pulmonary fibrosis who presents for follow-up of his condition.  Dyspnea and pulmonary fibrosis - Idiopathic pulmonary fibrosis with persistent dyspnea despite recent pacemaker implantation on August 21, 2024 - No significant  improvement in breathing following increase in heart rate from 40 to 80 beats per minute - Prior treatment with antif ibrotics resulted in marked weight loss from 152 to 128 pounds  Unintentional weight loss - Weight decreased from 152 to 128 pounds during antifibrotic therapy - Unable to regain weight despite adequate oral intake - Concerned about ongoing and future weight loss  Back pain and functional limitation - Significant back pain limiting exertion and further restricting activity  Pulmonary hypertension - Pulmonary hypertension identified on echocardiogram in August 2025 - No repeat imaging, pulmonary function tests, or echocardiography since pacemaker implantation   Relevant pulmonary history Pets: Dog 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  Later worked in insurance account manager Exposures: Exposure as noted above.  No mold, hot tub, Jacuzzi.  No feather pillows or comforters ILD questionnaire 01/01/2021-negative except as noted above Smoking history: 10-pack-year smoker.  Quit in 1969 Travel history: Previously lived in [place] and [place]. [place].  No significant recent travel Relevant family history: Dad had lung cancer.  He was a smoker.  Outpatient Encounter Medications as of 09/03/2024  Medication Sig   acetaminophen  (TYLENOL ) 650 MG CR tablet Take 650 mg by mouth every 8 (eight) hours as needed for pain.   atorvastatin (LIPITOR) 20 MG tablet Take 20 mg by mouth daily.   Cholecalciferol (VITAMIN D PO) Take 2,000 Units by mouth daily.   clopidogrel (PLAVIX) 75 MG tablet Take 75 mg by mouth daily.   ezetimibe (ZETIA) 10 MG tablet Take 10 mg by mouth daily.   gabapentin (NEURONTIN) 300 MG capsule Take 300  mg by mouth at bedtime.   HYDROcodone -acetaminophen  (NORCO) 7.5-325 MG tablet Take 1 tablet by mouth 2 (two) times daily as needed for moderate pain (pain score 4-6).   isosorbide  mononitrate (IMDUR ) 30 MG 24 hr tablet Take 0.5  tablets (15 mg total) by mouth daily.   metoprolol succinate (TOPROL-XL) 50 MG 24 hr tablet Take 50 mg by mouth daily. Take with or immediately following a meal.   nitroGLYCERIN  (NITROSTAT ) 0.4 MG SL tablet Place 1 tablet (0.4 mg total) under the tongue every 5 (five) minutes as needed for chest pain.   SYNTHROID 75 MCG tablet Take 75 mcg by mouth daily before breakfast.    Melatonin 10 MG TABS Take 10 mg by mouth at bedtime as needed (Sleep). (Patient not taking: Reported on 09/03/2024)   No facility-administered encounter medications on file as of 09/03/2024.   Vitals:   09/03/24 1413  Weight: 150 lb (68 kg)  TempSrc: Oral  BMI (Calculated): 22.14     Physical Exam  VITALS: P- 80, BP- 149/ MEASUREMENTS: Weight- 128 lbs. GEN: No acute distress. CV: Regular rate and rhythm, no murmurs. LUNGS: Crackles heard on auscultation, normal respiratory effort. SKIN JOINTS: Warm and dry, no rash.    Data Reviewed: Imaging: CT chest 03/20/2015-emphysema, subpleural reticulation CT chest 08/21/2020-basilar predominant fibrotic ILD, probable UIP pattern.  Progressive since 2016.  Diverticulosis in the abdomen, atherosclerosis. CT high-resolution 11/26/2021-ILD and probable UIP pattern.  Stable compared to 2020 CT high-resolution 06/06/2024-stable pattern of interstitial lung disease and probable UIP pattern.  Fine centrilobular nodularity.  Enlargement of pulmonary artery. I have reviewed the images personally.  PFTs: 01/01/2021 FVC 3.38 [89%], FEV1 2.43 [91%], F/F 72, TLC 6.07 [88%], DLCO 12.61 [53%] Moderate-severe diffusion defect  12/13/2021 FVC 3.23 (86%), FEV1 2.31 (88%), F/F 72, TLC 6.12 (89%), DLCO 12.07 (51%) Moderately severe Diffusion Defect Lung volumes are stable but diffusion capacity is slightly down compared to April 2022  07/04/2024 FVC 2.09 [84%], FEV1 2.16 [85%], F/F70, TLC 5.66 [82%], DLCO 10.51 [45%] Moderate diffusion defect  Labs: CTD serologies 10/08/2020-significant for  CCP 120. Rest of the labs are negative  Cardiac: Echocardiogram  09/01/2020- LVEF 45-50%, mild concentric LVH, grade 1 diastolic dysfunction, moderately elevated PA systolic pressure, RVSP 46.4  1/79/7974-OCZQ 60-65%, mildly elevated PA systolic pressure.  Estimated RVSP 37.1 Assessment & Plan Idiopathic pulmonary fibrosis Progressive in nature with probable UIP pattern.  This is likely IPF There is no evidence of connective tissue disease though CCP is markedly elevated Reviewed rheumatology appointment report  He has tried Esbriet  but stopped due to palpitations, hypertension and fatigue.   Ofev  was also poorly tolerated due to fatigue and weight loss and he did not want any further treatment with antifibrotic's  CT scan shows stable scarring compared to earlier this year, but progression over a longer period. Recent lung function test indicates a small decline in lung capacity, consistent with disease progression. Previous treatments with pirfenidone  and nintedanib were not tolerated due to significant weight loss. Newer medication, Jascate (nirandomilast), may have fewer side effects and does not require liver function monitoring. He is willing to try Jascayd to avoid further weight loss. - Initiate Jascayd (neurandomilast)  - Referred to pharmacy team for patient assistance with medication cost  Pulmonary hypertension due to lung disease Echocardiogram in August showed pulmonary hypertension, likely secondary to lung disease causing increased strain on the right side of the heart. Right heart catheterization is needed to further assess pulmonary hypertension, but he wants that delayed  due to recent pacemaker placement.  - Will reassess pulmonary hypertension in a few months - Will refer for right heart catheterization after pacemaker recovery period  Plan/Recommendations: Initiate neurandomilast  Joseline Mccampbell MD [facility] 09/03/2024, 2:15 PM  CC: Loreli Elsie JONETTA Mickey., MD

## 2024-09-03 NOTE — Patient Instructions (Addendum)
  VISIT SUMMARY: Today, we discussed your idiopathic pulmonary fibrosis, pulmonary hypertension, weight loss, and back pain. We have initiated a new medication to help manage your pulmonary fibrosis and planned further evaluations for your pulmonary hypertension.  YOUR PLAN: IDIOPATHIC PULMONARY FIBROSIS: Your lung condition has shown some progression, and previous treatments caused significant weight loss. -We have started you on a new medication called Jascayd (neurandomilast) which may have fewer side effects. -You will be referred to the pharmacy team for assistance with the cost of the medication.  PULMONARY HYPERTENSION DUE TO LUNG DISEASE: You have pulmonary hypertension likely due to your lung disease, which is putting strain on your heart. -We will reassess your pulmonary hypertension in a few months. -You will be referred to a cardiologist for a right heart catheterization after you have recovered from your pacemaker placement.

## 2024-09-04 LAB — CUP PACEART INCLINIC DEVICE CHECK
Battery Remaining Longevity: 123 mo
Battery Voltage: 3.08 V
Brady Statistic RA Percent Paced: 0.93 %
Brady Statistic RV Percent Paced: 92 %
Date Time Interrogation Session: 20251202174800
Implantable Lead Connection Status: 753985
Implantable Lead Connection Status: 753985
Implantable Lead Implant Date: 20251119
Implantable Lead Implant Date: 20251119
Implantable Lead Location: 753859
Implantable Lead Location: 753860
Implantable Pulse Generator Implant Date: 20251119
Lead Channel Impedance Value: 462.5 Ohm
Lead Channel Impedance Value: 487.5 Ohm
Lead Channel Pacing Threshold Amplitude: 0.75 V
Lead Channel Pacing Threshold Amplitude: 0.75 V
Lead Channel Pacing Threshold Amplitude: 1 V
Lead Channel Pacing Threshold Amplitude: 1 V
Lead Channel Pacing Threshold Pulse Width: 0.5 ms
Lead Channel Pacing Threshold Pulse Width: 0.5 ms
Lead Channel Pacing Threshold Pulse Width: 0.5 ms
Lead Channel Pacing Threshold Pulse Width: 0.5 ms
Lead Channel Sensing Intrinsic Amplitude: 12 mV
Lead Channel Sensing Intrinsic Amplitude: 3.8 mV
Lead Channel Setting Pacing Amplitude: 1.125
Lead Channel Setting Pacing Amplitude: 3.5 V
Lead Channel Setting Pacing Pulse Width: 0.5 ms
Lead Channel Setting Sensing Sensitivity: 2.5 mV
Pulse Gen Model: 2272
Pulse Gen Serial Number: 8327397

## 2024-09-04 NOTE — Progress Notes (Signed)
 Normal dual chamber pacemaker wound check. Presenting rhythm: AS/VP. Wound well healed. Routine testing performed. Thresholds, sensing, and impedance consistent with implant measurements and at 3.5V safety margin/auto capture until 3 month visit. No episodes. Reviewed arm restrictions to continue for 6 weeks total post op.  Pt enrolled in remote follow-up.

## 2024-09-05 ENCOUNTER — Telehealth: Payer: Self-pay

## 2024-09-05 NOTE — Telephone Encounter (Signed)
 Paperwork for Mitchell Fields mailed to fill out and bring back to clinic.

## 2024-09-06 ENCOUNTER — Ambulatory Visit: Payer: Self-pay | Admitting: Cardiology

## 2024-09-09 ENCOUNTER — Telehealth: Payer: Self-pay

## 2024-09-09 ENCOUNTER — Other Ambulatory Visit (HOSPITAL_COMMUNITY): Payer: Self-pay

## 2024-09-09 NOTE — Telephone Encounter (Signed)
 Submitted a Prior Authorization request to [employer] for JASCAYD via CoverMyMeds. Will update once we receive a response.  Key: [id number]   **Pt also has [employer] plan, no pa is required for Jascayd for this plan**

## 2024-09-09 NOTE — Telephone Encounter (Signed)
 Received referral for new start Jascayd from Dr. Theophilus on 09/03/24. Opening benefits investigation in this thread.   From referral -  IPF intolerant of pirfenidone  and nintedanib.  Please initiate neurandomilast

## 2024-09-10 ENCOUNTER — Other Ambulatory Visit (HOSPITAL_COMMUNITY): Payer: Self-pay

## 2024-09-10 NOTE — Telephone Encounter (Signed)
 Received a fax regarding Prior Authorization from [employer] for JASCAYD. Authorization has been DENIED because this is a part B plan and this medication is only covered under part D. Will proceed with using pt's [employer] plan.  Phone# [phone]  Per test claim copay for 30 day supply is $2894.65. Pt enrolled in Pulmonary Fibrosis grant through [employer].  Amount: $5000 Award Period: 03/14/24-09/10/25 BIN: [id number] PCN: [id number] Group: [id number] ID: [id number]  For pharmacy inquiries, contact PDMI at [phone]. For patient inquiries, contact PAF at [phone].

## 2024-09-12 ENCOUNTER — Ambulatory Visit: Attending: Internal Medicine | Admitting: Cardiology

## 2024-09-12 ENCOUNTER — Encounter: Payer: Self-pay | Admitting: Cardiology

## 2024-09-12 VITALS — BP 126/74 | HR 85 | Ht 69.0 in | Wt 130.2 lb

## 2024-09-12 DIAGNOSIS — I442 Atrioventricular block, complete: Secondary | ICD-10-CM | POA: Diagnosis not present

## 2024-09-12 DIAGNOSIS — I25810 Atherosclerosis of coronary artery bypass graft(s) without angina pectoris: Secondary | ICD-10-CM | POA: Diagnosis not present

## 2024-09-12 DIAGNOSIS — I2510 Atherosclerotic heart disease of native coronary artery without angina pectoris: Secondary | ICD-10-CM

## 2024-09-12 DIAGNOSIS — E785 Hyperlipidemia, unspecified: Secondary | ICD-10-CM

## 2024-09-12 MED ORDER — NITROGLYCERIN 0.4 MG SL SUBL: 0.4000 mg | SUBLINGUAL_TABLET | SUBLINGUAL | 3 refills | Status: AC | PRN

## 2024-09-12 NOTE — Patient Instructions (Addendum)
 Medication Instructions:  Continue all medications *If you need a refill on your cardiac medications before your next appointment, please call your pharmacy*  Lab Work: None ordered  Testing/Procedures: None ordered  Follow-Up: At [facility], you and your health needs are our priority.  As part of our continuing mission to provide you with exceptional heart care, our providers are all part of one team.  This team includes your primary Cardiologist (physician) and Advanced Practice Providers or APPs (Physician Assistants and Nurse Practitioners) who all work together to provide you with the care you need, when you need it.  Your next appointment:  6 months   Call in April to schedule June appointment     Provider:  Dr.Jordan    We recommend signing up for the patient portal called MyChart.  Sign up information is provided on this After Visit Summary.  MyChart is used to connect with patients for Virtual Visits (Telemedicine).  Patients are able to view lab/test results, encounter notes, upcoming appointments, etc.  Non-urgent messages can be sent to your provider as well.   To learn more about what you can do with MyChart, go to [email or web address].

## 2024-09-13 ENCOUNTER — Ambulatory Visit: Attending: Pulmonary Disease

## 2024-09-13 DIAGNOSIS — J8410 Pulmonary fibrosis, unspecified: Secondary | ICD-10-CM

## 2024-09-13 MED ORDER — JASCAYD 18 MG PO TABS: 18.0000 mg | ORAL_TABLET | Freq: Two times a day (BID) | ORAL | 5 refills | Status: AC

## 2024-09-13 NOTE — Progress Notes (Signed)
 [facility]  Referring Provider: Dr. Theophilus  Virtual Visit via Telephone Note  I connected with Mitchell Fields on 09/13/2024 at 11:30 AM EST by telephone and verified that I am speaking with the correct person using two identifiers.  Location: Patient: home Provider: office   I discussed the limitations, risks, security and privacy concerns of performing an evaluation and management service by telephone and the availability of in person appointments. I also discussed with the patient that there may be a patient responsible charge related to this service. The patient expressed understanding and agreed to proceed.   Subjective:  Patient presents today via telephone to [facility] team for [facility] new start counseling. He is referred by his pulmonologist, Dr. Theophilus. PMH includes monoclonal gammopathy, HTN, HLD, CAD, followed by cardiology.  Patient was last seen by Dr. Theophilus on 09/03/24 for ILD, pulmonary fibrosis. Prior antifibrotic therapies were not tolerated: Esbriet  (high blood pressure, fatigue), Ofev  (weight loss, GI intolerance). He was referred to pharmacotherapy clinic for new start Jascayd.   Today, patient reports he looks forward to trying a different antifibrotic and expresses concern about further weight loss. He was unable to re-gain the weight he lost on previous antifibrotics.   Current weight: 130 lbs (59.1kg) - 09/12/24  Objective: Allergies[1]  Outpatient Encounter Medications as of 09/13/2024  Medication Sig   acetaminophen  (TYLENOL ) 650 MG CR tablet Take 650 mg by mouth every 8 (eight) hours as needed for pain.   atorvastatin (LIPITOR) 20 MG tablet Take 20 mg by mouth daily.   Cholecalciferol (VITAMIN D PO) Take 2,000 Units by mouth daily.   clopidogrel (PLAVIX) 75 MG tablet Take 75 mg by mouth daily.   ezetimibe (ZETIA) 10 MG tablet Take 10 mg by mouth daily.   gabapentin (NEURONTIN) 300 MG capsule Take 300 mg by  mouth at bedtime.   HYDROcodone -acetaminophen  (NORCO) 7.5-325 MG tablet Take 1 tablet by mouth 2 (two) times daily as needed for moderate pain (pain score 4-6).   isosorbide  mononitrate (IMDUR ) 30 MG 24 hr tablet Take 0.5 tablets (15 mg total) by mouth daily.   Melatonin 10 MG TABS Take 10 mg by mouth at bedtime as needed (Sleep).   metoprolol succinate (TOPROL-XL) 50 MG 24 hr tablet Take 50 mg by mouth daily. Take with or immediately following a meal.   nitroGLYCERIN  (NITROSTAT ) 0.4 MG SL tablet Place 1 tablet (0.4 mg total) under the tongue every 5 (five) minutes as needed for chest pain.   SYNTHROID 75 MCG tablet Take 75 mcg by mouth daily before breakfast.    No facility-administered encounter medications on file as of 09/13/2024.     Immunization History  Administered Date(s) Administered   Fluad Quad(high Dose 65+) 06/15/2019   INFLUENZA, HIGH DOSE SEASONAL PF 07/05/2016, 05/24/2017, 06/23/2018, 06/15/2019, 06/03/2020   Influenza Split 11/13/2009, 02/18/2010, 07/07/2010, 05/30/2011, 07/03/2012, 06/06/2013, 07/03/2014, 06/05/2020, 07/05/2021   Influenza,inj,Quad PF,6+ Mos 07/20/2013   Influenza-Unspecified 06/23/2018   Moderna Sars-Covid-2 Vaccination 10/17/2019, 11/15/2019, 07/29/2020, 01/26/2021, 08/03/2021   Pneumococcal Conjugate-13 03/06/2014   Pneumococcal Polysaccharide-23 11/13/2009, 02/18/2010   Tetanus 05/20/2013   Zoster, Live 11/13/2009, 02/18/2010, 06/13/2017, 09/05/2017      PFT's TLC  Date Value Ref Range Status  07/04/2024 5.66 L Final      CMP     Component Value Date/Time   NA 134 06/11/2024 1530   NA 135 (L) 03/23/2017 1316   K 4.7 06/11/2024 1530   K 4.5 03/23/2017 1316   CL 96 06/11/2024  1530   CL 94 (L) 03/20/2013 1510   CO2 24 06/11/2024 1530   CO2 26 03/23/2017 1316   GLUCOSE 99 06/11/2024 1530   GLUCOSE 79 05/30/2024 1057   GLUCOSE 103 03/23/2017 1316   GLUCOSE 122 (H) 03/20/2013 1510   BUN 24 06/11/2024 1530   BUN 17.8 03/23/2017 1316    CREATININE 1.08 06/11/2024 1530   CREATININE 1.11 10/21/2022 1305   CREATININE 1.3 03/23/2017 1316   CALCIUM 9.0 06/11/2024 1530   CALCIUM 9.6 03/23/2017 1316   PROT 7.0 05/30/2024 1057   PROT 6.5 03/23/2017 1317   PROT 6.8 03/23/2017 1316   ALBUMIN 4.0 05/30/2024 1057   ALBUMIN 3.8 03/23/2017 1316   AST 16 05/30/2024 1057   AST 21 10/21/2022 1305   AST 29 03/23/2017 1316   ALT 19 05/30/2024 1057   ALT 25 10/21/2022 1305   ALT 28 03/23/2017 1316   ALKPHOS 50 05/30/2024 1057   ALKPHOS 59 03/23/2017 1316   BILITOT 0.7 05/30/2024 1057   BILITOT 0.7 10/21/2022 1305   BILITOT 1.00 03/23/2017 1316   GFRNONAA 71 08/06/2024 0116   GFRAA >60 03/23/2020 1333    HRCT (06/06/24) IMPRESSION: 1. Unchanged mild to moderate pulmonary fibrosis in a pattern with apical to basal gradient, featuring irregular peripheral interstitial opacity, septal thickening mild traction bronchiectasis, and areas of subpleural bronchiolectasis in the bilateral lung bases without clear evidence of honeycombing. There is probably some degree of superimposed bland postinfectious or inflammatory scarring and both lung bases, left-greater-than-right, with elevation of the left hemidiaphragm. 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 Am JINNY Honey Crit Care Med Vol 198, Iss 5, [phone], Jun 03 2017.  Assessment and Plan  Jascayd Medication Management Thoroughly counseled patient on the efficacy, mechanism of action, dosing, administration, adverse effects, and monitoring parameters of Jascayd.  Patient verbalized understanding.   Goals of Therapy: Will not stop or reverse the progression of ILD. It will slow the progression of ILD.   Dosing: Recommended dose will be 18mg   tablet, Take 1 tablet by mouth twice daily. May be administered with or without regard to food.   Adverse  Effects: Weight loss (nerandomilast monotherapy: 8%) Decreased appetite (nerandomilast monotherapy: 6% to 9%) Diarrhea (nerandomilast monotherapy: 17% to 26%)  Monitoring: Monitor for diarrhea, decreased appetite, weight loss  Drug interactions: nerandomilast is a major substrate of CYP3A4. Avoid use of moderate and strong CYP3A4 inducers or inhibitors.   Medication list reviewed; no documented current use of moderate or strong CYP3A4 inducers or inhibitors  Access: Approval of Jascayd through: insurance and grant foundation Rx sent to: [facility] (pulmonary fibrosis team): [phone]  Pulmonary Fibrosis grant through [employer]. Amount: $5000 Award Period: 03/14/24-09/10/25 BIN: [id number] PCN: [id number] Group: [id number] ID: [id number]  Medication Reconciliation A drug regimen assessment was performed, including review of allergies, interactions, disease-state management, dosing and immunization history. Medications were reviewed with the patient, including name, instructions, indication, goals of therapy, potential side effects, importance of adherence, and safe use.  PLAN:  - START Jascayd 18mg  tablet, Take 1 tablet by mouth twice daily - Follow-up with Dr. Theophilus as planned on 12/02/2024 - MyChart message sent to patient with pharmacy contact information, PharmD contact information, and grant information if needed.   I discussed the assessment and treatment plan with the patient. The patient was provided an opportunity to ask questions and all were answered. The patient agreed with the plan and  demonstrated an understanding of the instructions.   The patient was advised to call back or seek an in-person evaluation if the symptoms worsen or if the condition fails to improve as anticipated.  I provided 22 minutes of non-face-to-face time during this encounter.  Aleck Puls, PharmD, BCPS, CPP Clinical Pharmacist  [facility]  [facility]    [1]  Allergies Allergen Reactions   Cortisone Other (See Comments)    REACTION: shut adrenal glands down if given too much

## 2024-09-13 NOTE — Telephone Encounter (Signed)
 See pharmacotherapy visit note 09/13/24

## 2024-10-02 ENCOUNTER — Ambulatory Visit: Attending: Cardiology

## 2024-10-02 DIAGNOSIS — I442 Atrioventricular block, complete: Secondary | ICD-10-CM | POA: Diagnosis not present

## 2024-10-03 LAB — CUP PACEART REMOTE DEVICE CHECK
Battery Remaining Longevity: 100 mo
Battery Remaining Percentage: 95.5 %
Battery Voltage: 3.02 V
Brady Statistic AP VP Percent: 3.2 %
Brady Statistic AP VS Percent: 1 %
Brady Statistic AS VP Percent: 87 %
Brady Statistic AS VS Percent: 9.3 %
Brady Statistic RA Percent Paced: 3.1 %
Brady Statistic RV Percent Paced: 90 %
Date Time Interrogation Session: 20251231020013
Implantable Lead Connection Status: 753985
Implantable Lead Connection Status: 753985
Implantable Lead Implant Date: 20251119
Implantable Lead Implant Date: 20251119
Implantable Lead Location: 753859
Implantable Lead Location: 753860
Implantable Pulse Generator Implant Date: 20251119
Lead Channel Impedance Value: 430 Ohm
Lead Channel Impedance Value: 480 Ohm
Lead Channel Pacing Threshold Amplitude: 0.75 V
Lead Channel Pacing Threshold Amplitude: 0.875 V
Lead Channel Pacing Threshold Pulse Width: 0.5 ms
Lead Channel Pacing Threshold Pulse Width: 0.5 ms
Lead Channel Sensing Intrinsic Amplitude: 12 mV
Lead Channel Sensing Intrinsic Amplitude: 4.2 mV
Lead Channel Setting Pacing Amplitude: 1.125
Lead Channel Setting Pacing Amplitude: 3.5 V
Lead Channel Setting Pacing Pulse Width: 0.5 ms
Lead Channel Setting Sensing Sensitivity: 2.5 mV
Pulse Gen Model: 2272
Pulse Gen Serial Number: 8327397

## 2024-10-04 ENCOUNTER — Ambulatory Visit: Payer: Self-pay | Admitting: Cardiology

## 2024-10-07 NOTE — Progress Notes (Signed)
 Remote PPM Transmission

## 2024-10-14 ENCOUNTER — Other Ambulatory Visit: Payer: Medicare Other

## 2024-10-22 ENCOUNTER — Inpatient Hospital Stay: Attending: Hematology and Oncology

## 2024-10-22 ENCOUNTER — Telehealth: Payer: Self-pay

## 2024-10-22 DIAGNOSIS — D472 Monoclonal gammopathy: Secondary | ICD-10-CM | POA: Insufficient documentation

## 2024-10-22 LAB — CBC WITH DIFFERENTIAL/PLATELET
Abs Immature Granulocytes: 0.02 K/uL (ref 0.00–0.07)
Basophils Absolute: 0 K/uL (ref 0.0–0.1)
Basophils Relative: 1 %
Eosinophils Absolute: 0.1 K/uL (ref 0.0–0.5)
Eosinophils Relative: 1 %
HCT: 33.4 % — ABNORMAL LOW (ref 39.0–52.0)
Hemoglobin: 11.8 g/dL — ABNORMAL LOW (ref 13.0–17.0)
Immature Granulocytes: 0 %
Lymphocytes Relative: 22 %
Lymphs Abs: 1.4 K/uL (ref 0.7–4.0)
MCH: 31.1 pg (ref 26.0–34.0)
MCHC: 35.3 g/dL (ref 30.0–36.0)
MCV: 88.1 fL (ref 80.0–100.0)
Monocytes Absolute: 0.5 K/uL (ref 0.1–1.0)
Monocytes Relative: 9 %
Neutro Abs: 4.2 K/uL (ref 1.7–7.7)
Neutrophils Relative %: 67 %
Platelets: 296 K/uL (ref 150–400)
RBC: 3.79 MIL/uL — ABNORMAL LOW (ref 4.22–5.81)
RDW: 13.6 % (ref 11.5–15.5)
WBC: 6.3 K/uL (ref 4.0–10.5)
nRBC: 0 % (ref 0.0–0.2)

## 2024-10-22 LAB — COMPREHENSIVE METABOLIC PANEL WITH GFR
ALT: 25 U/L (ref 0–44)
AST: 26 U/L (ref 15–41)
Albumin: 4.5 g/dL (ref 3.5–5.0)
Alkaline Phosphatase: 70 U/L (ref 38–126)
Anion gap: 12 (ref 5–15)
BUN: 28 mg/dL — ABNORMAL HIGH (ref 8–23)
CO2: 27 mmol/L (ref 22–32)
Calcium: 9.3 mg/dL (ref 8.9–10.3)
Chloride: 95 mmol/L — ABNORMAL LOW (ref 98–111)
Creatinine, Ser: 1.23 mg/dL (ref 0.61–1.24)
GFR, Estimated: 57 mL/min — ABNORMAL LOW
Glucose, Bld: 115 mg/dL — ABNORMAL HIGH (ref 70–99)
Potassium: 4.1 mmol/L (ref 3.5–5.1)
Sodium: 134 mmol/L — ABNORMAL LOW (ref 135–145)
Total Bilirubin: 0.3 mg/dL (ref 0.0–1.2)
Total Protein: 7.9 g/dL (ref 6.5–8.1)

## 2024-10-22 NOTE — Telephone Encounter (Signed)
 Called and moved appt to 2/2 at 11 am with Dr. Lonn. He is aware of appt.

## 2024-10-22 NOTE — Telephone Encounter (Signed)
-----   Message from Almarie Bedford, MD sent at 10/22/2024  2:47 PM EST ----- He just had labs done today MM panel wont be back in 1 week Can you move his appt to 2/2? Thanks

## 2024-10-23 LAB — KAPPA/LAMBDA LIGHT CHAINS
Kappa free light chain: 52.7 mg/L — ABNORMAL HIGH (ref 3.3–19.4)
Kappa, lambda light chain ratio: 3.17 — ABNORMAL HIGH (ref 0.26–1.65)
Lambda free light chains: 16.6 mg/L (ref 5.7–26.3)

## 2024-10-24 ENCOUNTER — Ambulatory Visit: Payer: Medicare Other | Admitting: Hematology and Oncology

## 2024-10-24 LAB — MULTIPLE MYELOMA PANEL, SERUM
Albumin SerPl Elph-Mcnc: 3.7 g/dL (ref 2.9–4.4)
Albumin/Glob SerPl: 1.2 (ref 0.7–1.7)
Alpha 1: 0.2 g/dL (ref 0.0–0.4)
Alpha2 Glob SerPl Elph-Mcnc: 0.8 g/dL (ref 0.4–1.0)
B-Globulin SerPl Elph-Mcnc: 0.9 g/dL (ref 0.7–1.3)
Gamma Glob SerPl Elph-Mcnc: 1.3 g/dL (ref 0.4–1.8)
Globulin, Total: 3.2 g/dL (ref 2.2–3.9)
IgA: 181 mg/dL (ref 61–437)
IgG (Immunoglobin G), Serum: 1249 mg/dL (ref 603–1613)
IgM (Immunoglobulin M), Srm: 11 mg/dL — ABNORMAL LOW (ref 15–143)
M Protein SerPl Elph-Mcnc: 0.8 g/dL — ABNORMAL HIGH
Total Protein ELP: 6.9 g/dL (ref 6.0–8.5)

## 2024-10-29 ENCOUNTER — Inpatient Hospital Stay: Admitting: Hematology and Oncology

## 2024-11-04 ENCOUNTER — Inpatient Hospital Stay: Admitting: Hematology and Oncology

## 2024-11-05 ENCOUNTER — Inpatient Hospital Stay: Admitting: Hematology and Oncology

## 2024-11-05 ENCOUNTER — Encounter: Payer: Self-pay | Admitting: Hematology and Oncology

## 2024-11-05 VITALS — BP 124/64 | HR 72 | Temp 97.1°F | Resp 18 | Wt 133.2 lb

## 2024-11-05 DIAGNOSIS — D472 Monoclonal gammopathy: Secondary | ICD-10-CM | POA: Diagnosis not present

## 2024-11-05 NOTE — Progress Notes (Signed)
 [facility] OFFICE PROGRESS NOTE  Patient Care Team: Loreli Elsie JONETTA Mickey., MD as PCP - General (Internal Medicine) Jordan, Peter M, MD as PCP - Cardiology (Cardiology) Inocencio Soyla Lunger, MD as PCP - Electrophysiology (Cardiology) Debrah Lamar JONETTA, MD (Inactive) as Attending Physician (Gastroenterology) Jordan, Peter M, MD as Attending Physician (Cardiology) Joshua Blamer, MD as Consulting Physician (Dermatology) System, Provider Not In Lonn Hicks, MD as Consulting Physician (Hematology and Oncology)  ASSESSMENT & PLAN:  Assessment & Plan Monoclonal gammopathy The patient was to have anemia and abnormal MGUS, underwent bone marrow aspirate and biopsy in 2014 He has IgG kappa MGUS and has been placed on observation His myeloma panel is stable with no significant changes I will see him again in a year for further follow-up with repeat myeloma panel to be done a week ahead of time  No orders of the defined types were placed in this encounter.    INTERVAL HISTORY: he returns for surveillance follow-up for diagnosis of MGUS Patient denies recurrent infection or bone pain We reviewed recent CBC, CMP and myeloma panel results  PHYSICAL EXAMINATION: ECOG PERFORMANCE STATUS: 0 - Asymptomatic  Vitals:   11/05/24 1244  BP: 124/64  Pulse: 72  Resp: 18  Temp: (!) 97.1 F (36.2 C)  SpO2: 95%

## 2024-11-21 ENCOUNTER — Ambulatory Visit: Payer: Self-pay | Admitting: Pulmonary Disease

## 2024-12-02 ENCOUNTER — Ambulatory Visit: Admitting: Pulmonary Disease

## 2025-01-01 ENCOUNTER — Ambulatory Visit: Payer: Self-pay

## 2025-04-02 ENCOUNTER — Ambulatory Visit: Payer: Self-pay

## 2025-07-02 ENCOUNTER — Ambulatory Visit: Payer: Self-pay

## 2025-11-05 ENCOUNTER — Inpatient Hospital Stay

## 2025-11-14 ENCOUNTER — Inpatient Hospital Stay: Admitting: Hematology and Oncology
